# Patient Record
Sex: Female | Born: 1995 | Race: Black or African American | Hispanic: No | Marital: Married | Smoking: Never smoker
Health system: Southern US, Community
[De-identification: ages and names within clinical notes are randomized; demographics above are authoritative.]

## PROBLEM LIST (undated history)

## (undated) ENCOUNTER — Inpatient Hospital Stay (HOSPITAL_COMMUNITY): Payer: Self-pay

## (undated) DIAGNOSIS — G40909 Epilepsy, unspecified, not intractable, without status epilepticus: Secondary | ICD-10-CM

## (undated) DIAGNOSIS — M5430 Sciatica, unspecified side: Secondary | ICD-10-CM

## (undated) DIAGNOSIS — D649 Anemia, unspecified: Secondary | ICD-10-CM

## (undated) HISTORY — DX: Epilepsy, unspecified, not intractable, without status epilepticus: G40.909

---

## 2015-11-12 ENCOUNTER — Inpatient Hospital Stay (HOSPITAL_COMMUNITY)
Admission: AD | Admit: 2015-11-12 | Discharge: 2015-11-12 | Disposition: A | Discharge status: Home / Self Care | Payer: Self-pay | Source: Ambulatory Visit | Attending: Obstetrics & Gynecology | Admitting: Obstetrics & Gynecology

## 2015-11-12 DIAGNOSIS — N611 Abscess of the breast and nipple: Secondary | ICD-10-CM | POA: Insufficient documentation

## 2015-11-12 DIAGNOSIS — N61 Mastitis without abscess: Secondary | ICD-10-CM | POA: Insufficient documentation

## 2015-11-12 DIAGNOSIS — Z3202 Encounter for pregnancy test, result negative: Secondary | ICD-10-CM | POA: Insufficient documentation

## 2015-11-12 LAB — URINE MICROSCOPIC-ADD ON

## 2015-11-12 LAB — URINALYSIS, ROUTINE W REFLEX MICROSCOPIC
BILIRUBIN URINE: NEGATIVE
GLUCOSE, UA: NEGATIVE mg/dL
Ketones, ur: NEGATIVE mg/dL
LEUKOCYTES UA: NEGATIVE
Nitrite: NEGATIVE
Protein, ur: NEGATIVE mg/dL
UROBILINOGEN UA: 0.2 mg/dL (ref 0.0–1.0)
pH: 5.5 (ref 5.0–8.0)

## 2015-11-12 LAB — POCT PREGNANCY, URINE: PREG TEST UR: NEGATIVE

## 2015-11-12 MED ORDER — ACETAMINOPHEN-CODEINE #3 300-30 MG PO TABS: 1.0000 | ORAL_TABLET | ORAL | Status: DC | PRN

## 2015-11-12 MED ORDER — AMOXICILLIN-POT CLAVULANATE 875-125 MG PO TABS: 1.0000 | ORAL_TABLET | Freq: Two times a day (BID) | ORAL | Status: AC

## 2015-11-12 MED ORDER — IBUPROFEN 800 MG PO TABS: 800.0000 mg | ORAL_TABLET | Freq: Three times a day (TID) | ORAL | Status: DC

## 2015-11-12 NOTE — MAU Note (Signed)
Pt presents to MAU with complaints of pain in her right breast. States it is sore and very painful. Pt states she has never had intercourse and isn't pregnant.

## 2015-11-12 NOTE — MAU Provider Note (Signed)
History   19 yo black female in with c/o breast pain that started yesterday. Also low grade fever. Pt states she has never been sexually active.  CSN: [id number]  Arrival date & time 11/12/15  1152   None     Chief Complaint  Patient presents with  . Breast Pain    HPI  No past medical history on file.  No past surgical history on file.  No family history on file.  Social History  Substance Use Topics  . Smoking status: Not on file  . Smokeless tobacco: Not on file  . Alcohol Use: Not on file    OB History    No data available      Review of Systems  Constitutional: Negative.   HENT: Negative.   Eyes: Negative.   Respiratory: Negative.   Cardiovascular: Negative.   Endocrine: Negative.   Genitourinary: Negative.   Musculoskeletal: Negative.   Skin:       1.5 cm area at 1000 outer aspect right breast, red, firm, warm to touch  Allergic/Immunologic: Negative.   Neurological: Negative.   Hematological: Negative.   Psychiatric/Behavioral: Negative.     Allergies  Review of patient's allergies indicates not on file.  Home Medications  No current outpatient prescriptions on file.  BP 136/90 mmHg  Pulse 92  Temp(Src) 99.5 F (37.5 C) (Oral)  Resp 20  LMP 11/10/2015  Physical Exam  Constitutional: She is oriented to person, place, and time. She appears well-developed and well-nourished.  HENT:  Head: Normocephalic.  Eyes: Pupils are equal, round, and reactive to light.  Neck: Normal range of motion.  Cardiovascular: Normal rate, regular rhythm, normal heart sounds and intact distal pulses.   Pulmonary/Chest: Effort normal and breath sounds normal.  Abdominal: Soft. Bowel sounds are normal.  Musculoskeletal: Normal range of motion.  Neurological: She is alert and oriented to person, place, and time. She has normal reflexes.  Skin:  1.5 cm area at 1000 outer aspect right breast, red, firm, warm to touch   Psychiatric: She has a normal mood and  affect. Her behavior is normal. Judgment and thought content normal.    MAU Course  Procedures (including critical care time)  Labs Reviewed  URINALYSIS, ROUTINE W REFLEX MICROSCOPIC (NOT AT [facility])  URINE MICROSCOPIC-ADD ON  POCT PREGNANCY, URINE   No results found.   No diagnosis found.    MDM  1.5 cm breast abcess or cellulitis at 1000 outer aspect right breast. Start oral antibiotics, pain management and refer to breast center.

## 2015-11-12 NOTE — Discharge Instructions (Signed)
Abscess °An abscess (boil or furuncle) is an infected area on or under the skin. This area is filled with yellowish-white fluid (pus) and other material (debris). °HOME CARE  °· Only take medicines as told by your doctor. °· If you were given antibiotic medicine, take it as directed. Finish the medicine even if you start to feel better. °· If gauze is used, follow your doctor's directions for changing the gauze. °· To avoid spreading the infection: °¨ Keep your abscess covered with a bandage. °¨ Wash your hands well. °¨ Do not share personal care items, towels, or whirlpools with others. °¨ Avoid skin contact with others. °· Keep your skin and clothes clean around the abscess. °· Keep all doctor visits as told. °GET HELP RIGHT AWAY IF:  °· You have more pain, puffiness (swelling), or redness in the wound site. °· You have more fluid or blood coming from the wound site. °· You have muscle aches, chills, or you feel sick. °· You have a fever. °MAKE SURE YOU:  °· Understand these instructions. °· Will watch your condition. °· Will get help right away if you are not doing well or get worse. °  °This information is not intended to replace advice given to you by your health care provider. Make sure you discuss any questions you have with your health care provider. °  °Document Released: 06/02/2008 Document Revised: 06/15/2012 Document Reviewed: 02/28/2012 °Elsevier Interactive Patient Education ©2016 [employer] ° °

## 2017-04-07 ENCOUNTER — Other Ambulatory Visit: Payer: Self-pay | Admitting: Infectious Disease

## 2017-04-07 ENCOUNTER — Ambulatory Visit
Admission: RE | Admit: 2017-04-07 | Discharge: 2017-04-07 | Disposition: A | Discharge status: Home / Self Care | Payer: No Typology Code available for payment source | Source: Ambulatory Visit | Attending: Infectious Disease | Admitting: Infectious Disease

## 2017-04-07 DIAGNOSIS — R7611 Nonspecific reaction to tuberculin skin test without active tuberculosis: Secondary | ICD-10-CM

## 2019-11-29 ENCOUNTER — Other Ambulatory Visit: Payer: Self-pay

## 2019-11-29 DIAGNOSIS — Z20822 Contact with and (suspected) exposure to covid-19: Secondary | ICD-10-CM

## 2019-11-30 LAB — NOVEL CORONAVIRUS, NAA: SARS-CoV-2, NAA: NOT DETECTED

## 2020-02-08 ENCOUNTER — Encounter (HOSPITAL_COMMUNITY): Payer: Self-pay | Admitting: Emergency Medicine

## 2020-02-08 ENCOUNTER — Ambulatory Visit (HOSPITAL_COMMUNITY)
Admission: EM | Admit: 2020-02-08 | Discharge: 2020-02-08 | Disposition: A | Discharge status: Home / Self Care | Payer: Commercial Managed Care - PPO | Attending: Family Medicine | Admitting: Family Medicine

## 2020-02-08 ENCOUNTER — Other Ambulatory Visit: Payer: Self-pay

## 2020-02-08 DIAGNOSIS — M5432 Sciatica, left side: Secondary | ICD-10-CM | POA: Diagnosis not present

## 2020-02-08 MED ORDER — CYCLOBENZAPRINE HCL 10 MG PO TABS: 5.0000 mg | ORAL_TABLET | Freq: Every day | ORAL | 0 refills | Status: DC

## 2020-02-08 MED ORDER — PREDNISONE 10 MG (21) PO TBPK: ORAL_TABLET | ORAL | 0 refills | Status: DC

## 2020-02-08 NOTE — ED Provider Notes (Signed)
[facility]    CSN: [id number] Arrival date & time: 02/08/20  1219      History   Chief Complaint Chief Complaint  Patient presents with  . Hip Pain    HPI Barbara Rice is a 24 y.o. female.   Pt is a 24 year old female that presents with left lower back/buttocks pain with radiation down the left leg.  Been constant, waxing waning over the past week.  Denies any preceding injuries or heavy lifting.  Describes her pain as sharp and stabbing at times and having some muscle spasming in the left leg.  Some associated tingling at times and hot and cold sensations.  She took an Advil last night and was able to rest but woke up with the pain again this morning.  Pain seems worsened when laying flat.  No fever, chills, saddle paresthesias, loss of bowel or bladder function.  ROS per HPI      History reviewed. No pertinent past medical history.  There are no problems to display for this patient.   History reviewed. No pertinent surgical history.  OB History   No obstetric history on file.      Home Medications    Prior to Admission medications   Medication Sig Start Date End Date Taking? Authorizing Provider  ibuprofen (ADVIL,MOTRIN) 800 MG tablet Take 1 tablet (800 mg total) by mouth 3 (three) times daily. 11/12/15  Yes Montez Morita, CNM  acetaminophen-codeine (TYLENOL #3) 300-30 MG tablet Take 1 tablet by mouth every 4 (four) hours as needed for moderate pain. 11/12/15   Montez Morita, CNM  cyclobenzaprine (FLEXERIL) 10 MG tablet Take 0.5 tablets (5 mg total) by mouth at bedtime. 02/08/20   Dahlia Byes A, NP  predniSONE (STERAPRED UNI-PAK 21 TAB) 10 MG (21) TBPK tablet 6 tabs for 1 day, then 5 tabs for 1 das, then 4 tabs for 1 day, then 3 tabs for 1 day, 2 tabs for 1 day, then 1 tab for 1 day 02/08/20   Janace Aris, NP    Family History No family history on file.  Social History Social History   Tobacco Use  . Smoking status: Never Smoker  .  Smokeless tobacco: Never Used  Substance Use Topics  . Alcohol use: Not Currently  . Drug use: Never     Allergies   Patient has no known allergies.   Review of Systems Review of Systems   Physical Exam Triage Vital Signs ED Triage Vitals  Enc Vitals Group     BP 02/08/20 1240 130/89     Pulse Rate 02/08/20 1240 89     Resp 02/08/20 1240 16     Temp 02/08/20 1240 98.5 F (36.9 C)     Temp Source 02/08/20 1240 Oral     SpO2 02/08/20 1240 99 %     Weight --      Height --      Head Circumference --      Peak Flow --      Pain Score 02/08/20 1238 8     Pain Loc --      Pain Edu? --      Excl. in [place]? --    No data found.  Updated Vital Signs BP 130/89   Pulse 89   Temp 98.5 F (36.9 C) (Oral)   Resp 16   LMP 01/15/2020   SpO2 99%   Visual Acuity Right Eye Distance:   Left Eye Distance:  Bilateral Distance:    Right Eye Near:   Left Eye Near:    Bilateral Near:     Physical Exam Vitals and nursing note reviewed.  Constitutional:      General: She is not in acute distress.    Appearance: Normal appearance. She is not ill-appearing, toxic-appearing or diaphoretic.  HENT:     Head: Normocephalic.     Nose: Nose normal.  Eyes:     Conjunctiva/sclera: Conjunctivae normal.  Pulmonary:     Effort: Pulmonary effort is normal.  Musculoskeletal:     Cervical back: Normal range of motion.     Lumbar back: Spasms present. No bony tenderness. Decreased range of motion. Positive left straight leg raise test.       Back:  Skin:    General: Skin is warm and dry.     Findings: No rash.  Neurological:     Mental Status: She is alert.  Psychiatric:        Mood and Affect: Mood normal.      UC Treatments / Results  Labs (all labs ordered are listed, but only abnormal results are displayed) Labs Reviewed - No data to display  EKG   Radiology No results found.  Procedures Procedures (including critical care time)  Medications Ordered in  UC Medications - No data to display  Initial Impression / Assessment and Plan / UC Course  I have reviewed the triage vital signs and the nursing notes.  Pertinent labs & imaging results that were available during my care of the patient were reviewed by me and considered in my medical decision making (see chart for details).     Sciatica of left side.  Symptoms and exam consistent with this. No red flags.  Treating with prednisone and low-dose muscle relaxant Recommend rest, heat and gentle stretching and massage Follow up as needed for continued or worsening symptoms  Final Clinical Impressions(s) / UC Diagnoses   Final diagnoses:  Sciatica of left side     Discharge Instructions     Take the medication as prescribed Prednisone taper over the next 6 days. Take early in the day with food.  Flexeril as needed every 12 hours or at bedtime.  Stretching, heat Follow up as needed for continued or worsening symptoms     ED Prescriptions    Medication Sig Dispense Auth. Provider   predniSONE (STERAPRED UNI-PAK 21 TAB) 10 MG (21) TBPK tablet 6 tabs for 1 day, then 5 tabs for 1 das, then 4 tabs for 1 day, then 3 tabs for 1 day, 2 tabs for 1 day, then 1 tab for 1 day 21 tablet Yeimy Brabant A, NP   cyclobenzaprine (FLEXERIL) 10 MG tablet Take 0.5 tablets (5 mg total) by mouth at bedtime. 20 tablet Loura Halt A, NP     PDMP not reviewed this encounter.   Loura Halt A, NP 02/08/20 1418

## 2020-02-08 NOTE — ED Triage Notes (Signed)
PT reports left hip pain started Thursday. She took advil and it got worse. Pain goes down left leg. Sharp shooting pain down leg. No known injury.

## 2020-02-08 NOTE — Discharge Instructions (Addendum)
Take the medication as prescribed Prednisone taper over the next 6 days. Take early in the day with food.  Flexeril as needed every 12 hours or at bedtime.  Stretching, heat Follow up as needed for continued or worsening symptoms

## 2020-05-30 ENCOUNTER — Other Ambulatory Visit: Payer: Self-pay

## 2020-05-30 ENCOUNTER — Encounter (HOSPITAL_COMMUNITY): Payer: Self-pay

## 2020-05-30 ENCOUNTER — Ambulatory Visit (HOSPITAL_COMMUNITY)
Admission: EM | Admit: 2020-05-30 | Discharge: 2020-05-30 | Disposition: A | Discharge status: Home / Self Care | Payer: Commercial Managed Care - PPO | Attending: Internal Medicine | Admitting: Internal Medicine

## 2020-05-30 ENCOUNTER — Ambulatory Visit (INDEPENDENT_AMBULATORY_CARE_PROVIDER_SITE_OTHER): Payer: Commercial Managed Care - PPO

## 2020-05-30 DIAGNOSIS — S139XXA Sprain of joints and ligaments of unspecified parts of neck, initial encounter: Secondary | ICD-10-CM

## 2020-05-30 HISTORY — DX: Sciatica, unspecified side: M54.30

## 2020-05-30 MED ORDER — IBUPROFEN 600 MG PO TABS: 600.0000 mg | ORAL_TABLET | Freq: Four times a day (QID) | ORAL | 0 refills | Status: DC | PRN

## 2020-05-30 MED ORDER — KETOROLAC TROMETHAMINE 30 MG/ML IJ SOLN
INTRAMUSCULAR | Status: AC
  Filled 2020-05-30: qty 1

## 2020-05-30 MED ORDER — CYCLOBENZAPRINE HCL 5 MG PO TABS: 5.0000 mg | ORAL_TABLET | Freq: Three times a day (TID) | ORAL | 0 refills | Status: DC | PRN

## 2020-05-30 MED ORDER — KETOROLAC TROMETHAMINE 30 MG/ML IJ SOLN
30.0000 mg | Freq: Once | INTRAMUSCULAR | Status: AC
  Administered 2020-05-30: 30 mg via INTRAMUSCULAR
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
## 2020-11-17 IMAGING — DX DG CERVICAL SPINE COMPLETE 4+V
6 series · 6 of 6 positions shown · non-contrast
Comparison: None.

CLINICAL DATA: 23-year-old female with neck pain.

EXAM:
CERVICAL SPINE - COMPLETE 4+ VIEW

[c-spine lat]
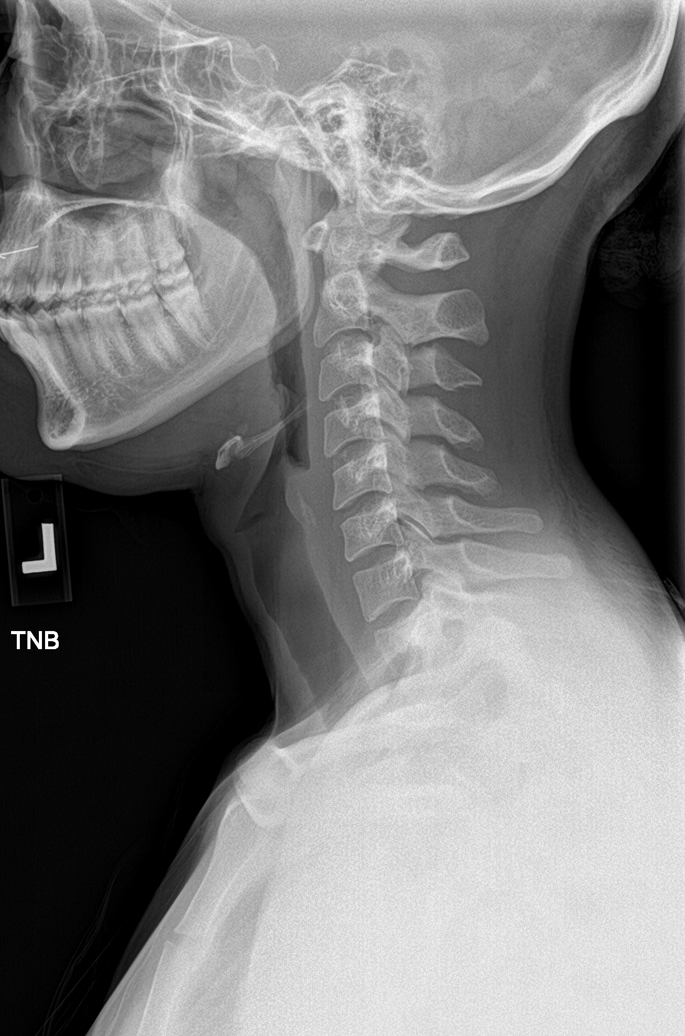

[c-spine obl (1 of 2)]
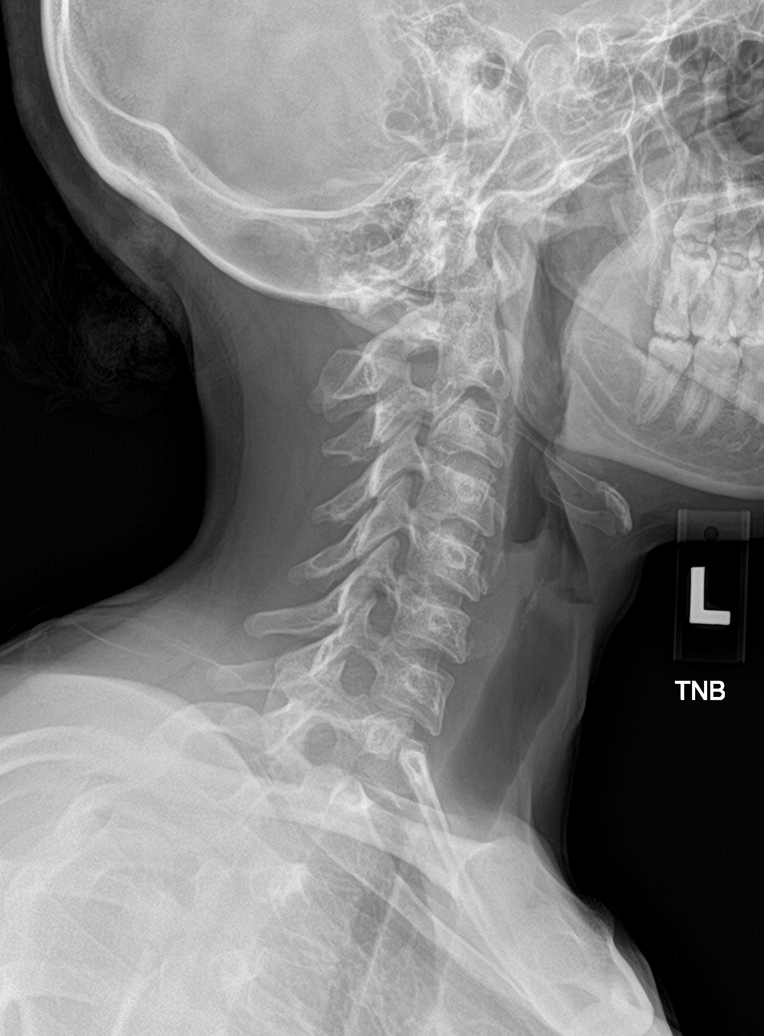

[c-spine obl (2 of 2)]
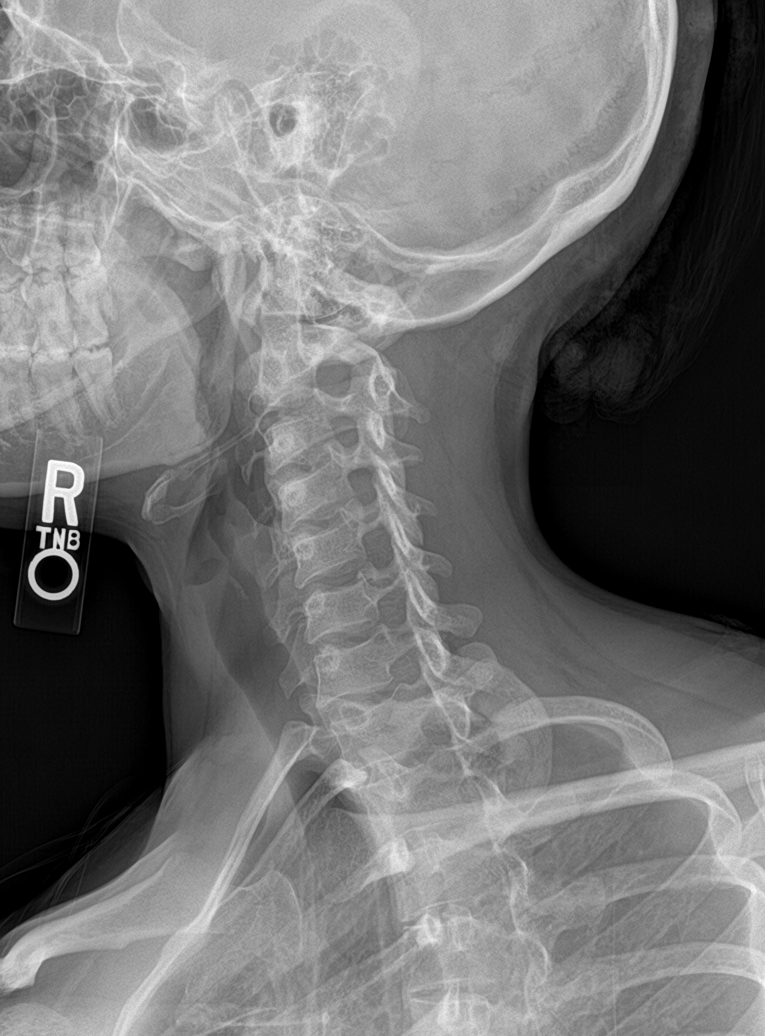

[c-spine ap]
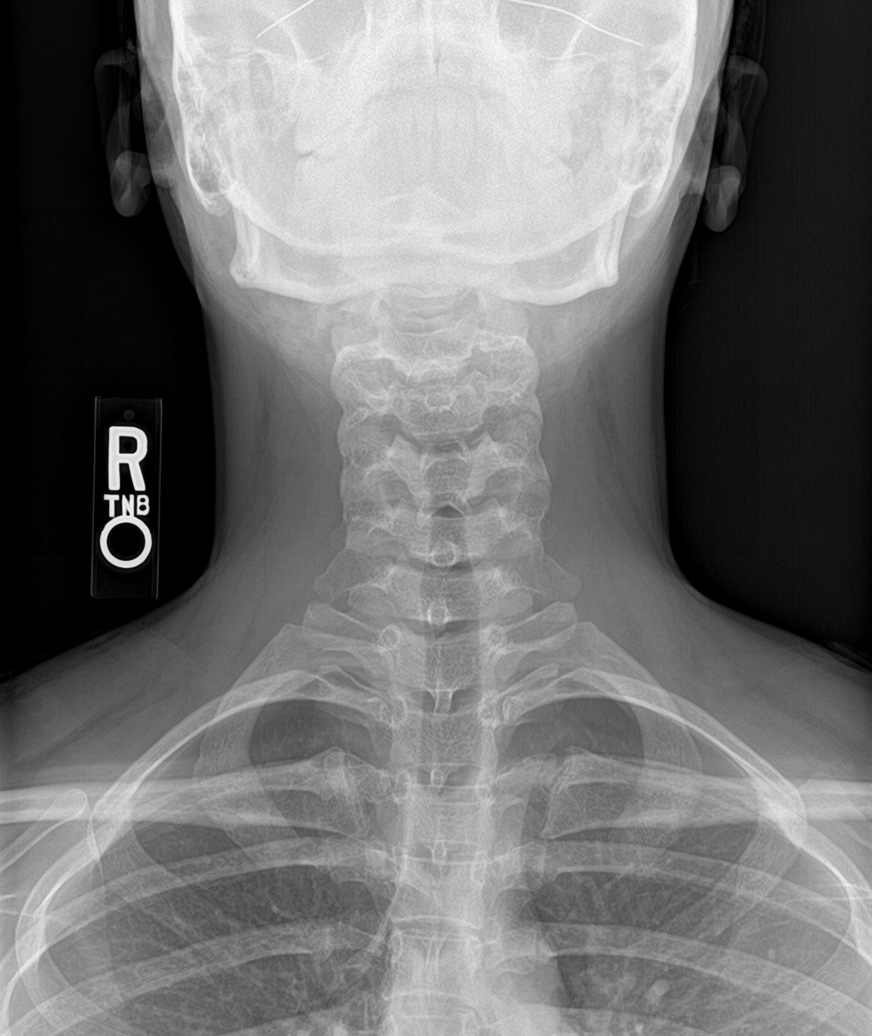

[c-spine open mouth]
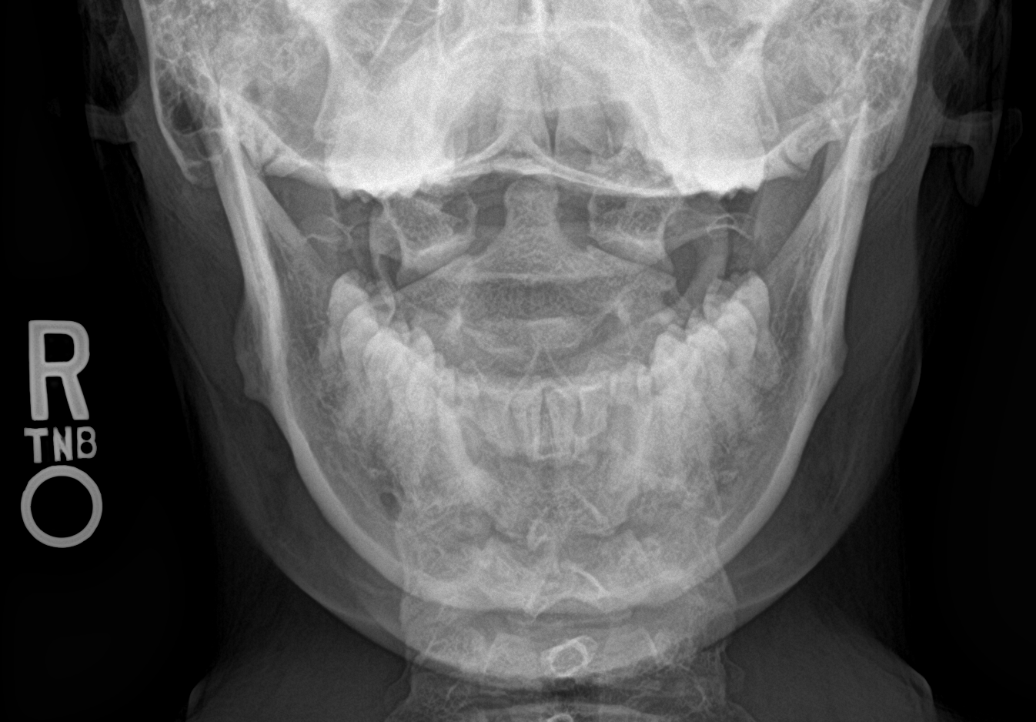

[c-spine swimmers]
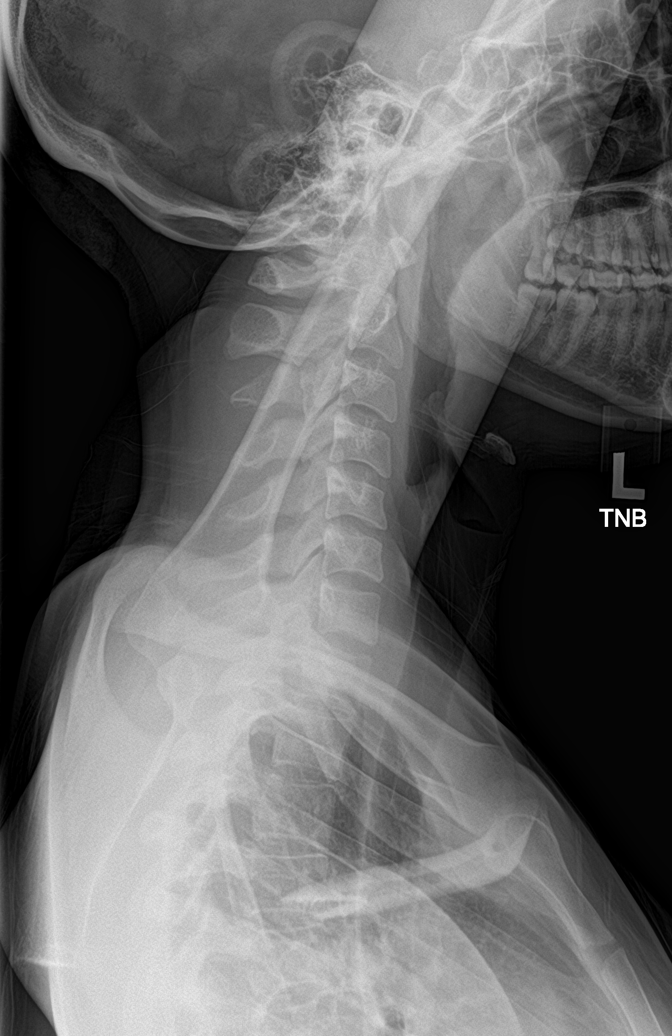

[6 of 6 positions shown; findings below may reference images not displayed]

FINDINGS: There is no evidence of cervical spine fracture or prevertebral soft
tissue swelling. Alignment is normal. No other significant bone
abnormalities are identified.
IMPRESSION: Negative cervical spine radiographs.

## 2021-11-01 ENCOUNTER — Other Ambulatory Visit: Payer: Self-pay

## 2021-11-01 ENCOUNTER — Ambulatory Visit (HOSPITAL_COMMUNITY)
Admission: EM | Admit: 2021-11-01 | Discharge: 2021-11-01 | Disposition: A | Discharge status: Home / Self Care | Payer: 59 | Attending: Physician Assistant | Admitting: Physician Assistant

## 2021-11-01 ENCOUNTER — Encounter (HOSPITAL_COMMUNITY): Payer: Self-pay

## 2021-11-01 DIAGNOSIS — M7989 Other specified soft tissue disorders: Secondary | ICD-10-CM | POA: Diagnosis not present

## 2021-11-01 DIAGNOSIS — M79671 Pain in right foot: Secondary | ICD-10-CM | POA: Diagnosis not present

## 2021-11-01 DIAGNOSIS — M79605 Pain in left leg: Secondary | ICD-10-CM

## 2021-11-01 NOTE — Discharge Instructions (Signed)
Go get ultrasound tomorrow to make sure you not have a blood clot.  Continue using TED hose/compression stockings.  Avoid salt and keep your legs elevated.  You can use over-the-counter medications for symptom relief.  Recommend he follow-up with podiatrist for your ongoing foot pain.  In the meantime use supportive insoles and footwear.  If you develop any chest pain or shortness of breath you need to go to the hospital.

## 2021-11-01 NOTE — ED Triage Notes (Signed)
Pt presents with bilateral swelling on legs. Pt states she feels tightness on her legs. States the compression socks have reduced the swelling.

## 2021-11-01 NOTE — ED Provider Notes (Signed)
[facility]    CSN: [id number] Arrival date & time: 11/01/21  1934      History   Chief Complaint Chief Complaint  Patient presents with   Leg Problem    HPI Barbara Rice is a 25 y.o. female.   Patient presents today with several concerns.  Her primary concern today is bilateral leg swelling that was significantly worse on the left side.  She reports developing a tightness and discomfort sensation in both legs worse on the left and was given compression stockings by nurse who works with her but provided improvement of symptoms.  She denies any dietary or medication changes prior to symptom onset.  Denies history of thyroid condition, chronic liver/kidney disease, heart failure.  She does report occasional episodes of shortness of breath but is unsure if this is related to anxiety or underlying issue.  She does have a history of surgery when she was much younger on her left foot but denies any ongoing swelling related to this.  She denies any history of VTE event, exogenous hormones, recent hospitalization, immobilization.  Denies any current chest pain, shortness of breath, heart racing.  In addition, patient reports a prolonged history of intermittent right medial foot pain.  She has been using supportive compression sock with minimal improvement of symptoms.  She does report symptoms are worse with prolonged ambulation such as when she is at work.  She has tried over-the-counter analgesics with temporary improvement of symptoms.  She has not seen a podiatrist in the past or had prescription insoles.   Past Medical History:  Diagnosis Date   Sciatic nerve pain     There are no problems to display for this patient.   History reviewed. No pertinent surgical history.  OB History   No obstetric history on file.      Home Medications    Prior to Admission medications   Medication Sig Start Date End Date Taking? Authorizing Provider  acetaminophen-codeine  (TYLENOL #3) 300-30 MG tablet Take 1 tablet by mouth every 4 (four) hours as needed for moderate pain. 11/12/15   Montez Morita, CNM  cyclobenzaprine (FLEXERIL) 5 MG tablet Take 1 tablet (5 mg total) by mouth 3 (three) times daily as needed for muscle spasms. 05/30/20   Lamptey, Britta Mccreedy, MD  ibuprofen (ADVIL) 600 MG tablet Take 1 tablet (600 mg total) by mouth every 6 (six) hours as needed. 05/30/20   Lamptey, Britta Mccreedy, MD  predniSONE (STERAPRED UNI-PAK 21 TAB) 10 MG (21) TBPK tablet 6 tabs for 1 day, then 5 tabs for 1 das, then 4 tabs for 1 day, then 3 tabs for 1 day, 2 tabs for 1 day, then 1 tab for 1 day 02/08/20   Janace Aris, NP    Family History History reviewed. No pertinent family history.  Social History Social History   Tobacco Use   Smoking status: Never   Smokeless tobacco: Never  Substance Use Topics   Alcohol use: Not Currently   Drug use: Never     Allergies   Patient has no known allergies.   Review of Systems Review of Systems  Constitutional:  Positive for activity change. Negative for appetite change, fatigue and fever.  Respiratory:  Positive for shortness of breath. Negative for cough.   Cardiovascular:  Positive for leg swelling. Negative for chest pain.  Gastrointestinal:  Negative for abdominal pain, diarrhea, nausea and vomiting.  Musculoskeletal:  Positive for gait problem and myalgias. Negative for arthralgias.  Neurological:  Negative for dizziness, light-headedness, numbness and headaches.    Physical Exam Triage Vital Signs ED Triage Vitals  Enc Vitals Group     BP 11/01/21 1958 (!) 147/89     Pulse Rate 11/01/21 1957 88     Resp 11/01/21 1957 18     Temp --      Temp src --      SpO2 11/01/21 1957 100 %     Weight --      Height --      Head Circumference --      Peak Flow --      Pain Score 11/01/21 1956 3     Pain Loc --      Pain Edu? --      Excl. in GC? --    No data found.  Updated Vital Signs BP (!) 147/89   Pulse 88    Resp 18   LMP 10/12/2021 (Exact Date)   SpO2 100%   Visual Acuity Right Eye Distance:   Left Eye Distance:   Bilateral Distance:    Right Eye Near:   Left Eye Near:    Bilateral Near:     Physical Exam Vitals reviewed.  Constitutional:      General: She is awake. She is not in acute distress.    Appearance: Normal appearance. She is well-developed. She is not ill-appearing.     Comments: Very pleasant female appears stated age in no acute distress sitting comfortably in exam room  HENT:     Head: Normocephalic and atraumatic.  Cardiovascular:     Rate and Rhythm: Normal rate and regular rhythm.     Heart sounds: Normal heart sounds, S1 normal and S2 normal. No murmur heard.    Comments: Nonpitting edema to knee bilaterally Pulmonary:     Effort: Pulmonary effort is normal.     Breath sounds: Normal breath sounds. No wheezing, rhonchi or rales.     Comments: Clear to auscultation bilaterally Abdominal:     General: Bowel sounds are normal.     Palpations: Abdomen is soft.     Tenderness: There is no abdominal tenderness.  Musculoskeletal:     Right lower leg: Tenderness present. No deformity or bony tenderness. No edema.     Left lower leg: Tenderness present. No deformity or bony tenderness. No edema.  Psychiatric:        Behavior: Behavior is cooperative.     UC Treatments / Results  Labs (all labs ordered are listed, but only abnormal results are displayed) Labs Reviewed - No data to display  EKG   Radiology No results found.  Procedures Procedures (including critical care time)  Medications Ordered in UC Medications - No data to display  Initial Impression / Assessment and Plan / UC Course  I have reviewed the triage vital signs and the nursing notes.  Pertinent labs & imaging results that were available during my care of the patient were reviewed by me and considered in my medical decision making (see chart for details).     Suspect dependent edema  given clinical presentation including improvement with compression stockings.  Given increased swelling on left with associated pain will order outpatient ultrasound to rule out DVT.  Discussed that if she develops any recurrent leg swelling or additional symptoms such as chest pain, shortness of breath, lightheadedness, palpitations she needs to go to the emergency room.  Discussed that if symptoms persist will need to consider work-up for edema including TSH, CMP,  UA, BNP.  Will defer additional testing at this time given short duration of symptoms have significantly improved.  She was encouraged to continue using conservative treatment including compression, elevation, avoiding sodium.  Recommended follow-up with primary care provider for ongoing management.  Should return precautions given to which she expressed understanding.  Patient denies any significant trauma and had no bony tenderness on exam to warrant plain films.  Discussed that symptoms are most likely related to need for insoles or more supportive shoes.  She was given contact information for podiatrist and encouraged to follow-up and schedule an appointment.  She can use over-the-counter analgesics for symptom relief.  Recommended she avoid prolonged ambulation and standing.  Strict return precautions given to which she expressed understanding.  Final Clinical Impressions(s) / UC Diagnoses   Final diagnoses:  Left leg pain  Right foot pain  Left leg swelling     Discharge Instructions      Go get ultrasound tomorrow to make sure you not have a blood clot.  Continue using TED hose/compression stockings.  Avoid salt and keep your legs elevated.  You can use over-the-counter medications for symptom relief.  Recommend he follow-up with podiatrist for your ongoing foot pain.  In the meantime use supportive insoles and footwear.  If you develop any chest pain or shortness of breath you need to go to the hospital.     ED Prescriptions    None    PDMP not reviewed this encounter.   Jeani Hawking, PA-C 11/01/21 2108

## 2021-11-02 ENCOUNTER — Ambulatory Visit (HOSPITAL_COMMUNITY)
Admission: RE | Admit: 2021-11-02 | Discharge: 2021-11-02 | Disposition: A | Discharge status: Home / Self Care | Payer: 59 | Source: Ambulatory Visit | Attending: Physician Assistant | Admitting: Physician Assistant

## 2021-11-02 DIAGNOSIS — R600 Localized edema: Secondary | ICD-10-CM | POA: Insufficient documentation

## 2021-11-02 DIAGNOSIS — R531 Weakness: Secondary | ICD-10-CM | POA: Diagnosis not present

## 2021-11-02 DIAGNOSIS — M7989 Other specified soft tissue disorders: Secondary | ICD-10-CM | POA: Diagnosis not present

## 2021-11-02 NOTE — Progress Notes (Signed)
VASCULAR LAB    Bilateral lower extremity venous duplex has been performed.  See CV proc for preliminary results.   Torsha Lemus, RVT 11/02/2021, 11:25 AM

## 2021-11-12 ENCOUNTER — Encounter: Payer: Self-pay | Admitting: Podiatry

## 2021-11-12 ENCOUNTER — Ambulatory Visit (INDEPENDENT_AMBULATORY_CARE_PROVIDER_SITE_OTHER): Payer: 59

## 2021-11-12 ENCOUNTER — Other Ambulatory Visit: Payer: Self-pay

## 2021-11-12 ENCOUNTER — Ambulatory Visit (INDEPENDENT_AMBULATORY_CARE_PROVIDER_SITE_OTHER): Payer: 59 | Admitting: Podiatry

## 2021-11-12 DIAGNOSIS — M2142 Flat foot [pes planus] (acquired), left foot: Secondary | ICD-10-CM

## 2021-11-12 DIAGNOSIS — M7750 Other enthesopathy of unspecified foot: Secondary | ICD-10-CM | POA: Diagnosis not present

## 2021-11-12 DIAGNOSIS — M25571 Pain in right ankle and joints of right foot: Secondary | ICD-10-CM | POA: Diagnosis not present

## 2021-11-12 DIAGNOSIS — M7752 Other enthesopathy of left foot: Secondary | ICD-10-CM | POA: Diagnosis not present

## 2021-11-12 DIAGNOSIS — M2141 Flat foot [pes planus] (acquired), right foot: Secondary | ICD-10-CM

## 2021-11-12 NOTE — Progress Notes (Signed)
  Subjective:  Patient ID: Barbara Rice, female    DOB: 11-05-1996,  MRN: [id number]  Chief Complaint  Patient presents with   Foot Pain    NP  right foot pain    25 y.o. female presents with the above complaint. History confirmed with patient.  Pain is in the bottom of the arch in the front of the ankle  Objective:  Physical Exam: warm, good capillary refill, no trophic changes or ulcerative lesions, normal DP and PT pulses, normal sensory exam, and she has right worse than left pes planus deformity with collapse of the medial arch, pain on palpation of the plantar medial arch on the right side and the sinus tarsi.   Radiographs: Multiple views x-ray of the right foot: no fracture, dislocation, swelling or degenerative changes noted and pes planus Assessment:   1. Pes planus of both feet   2. Sinus tarsitis, right      Plan:  Patient was evaluated and treated and all questions answered.  She has significant pes planus for me which I reviewed with her.  I discussed with her that this on its own does not necessarily an issue unless it becomes symptomatic.  I recommended supporting with a longitudinal foot orthosis and a prefabricated 1 was dispensed to her today that she purchased.  Also discussed shoe gear and stretching.  She will return to see me as needed for this.   Return if symptoms worsen or fail to improve.

## 2021-11-12 NOTE — Patient Instructions (Signed)
Posterior Tibial Tendinitis  Posterior tibial tendinitis is irritation of a tendon called the posterior tibial tendon. Your posterior tibial tendon is a cord-like tissue that connects bones of your lower leg and foot to a muscle that: Supports your arch. Helps you raise up on your toes. Helps you turn your foot down and in. This condition causes foot and ankle pain. It can also lead to a flat foot. What are the causes? This condition is most often caused by repeated stress to the tendon (overuse injury). It can also be caused by a sudden injury that stresses the tendon, such as landing on your foot after jumping or falling. What increases the risk? This condition is more likely to develop in: People who play a sport that involves putting a lot of pressure on the feet, such as: Basketball. Tennis. Soccer. Hockey. Runners. Females who are older than 27 years of age and are overweight. People with diabetes. People with decreased foot stability. People with flat feet. What are the signs or symptoms? Symptoms include: Pain in the inner ankle. Pain at the arch of your foot. Pain that gets worse with running, walking, or standing. Swelling on the inside of your ankle and foot. Weakness in your ankle or foot. Inability to stand up on tiptoe. Flattening of the arch of your foot. How is this diagnosed? This condition may be diagnosed based on: Your symptoms. Your medical history. A physical exam. Tests, such as: X-ray. MRI. Ultrasound. How is this treated? This condition may be treated by: Putting ice to the injured area. Taking NSAIDs, such as ibuprofen, to reduce pain and swelling. Wearing a special shoe or shoe insert to support your arch (orthotic). Having physical therapy. Replacing high-impact exercise with low-impact exercise, such as swimming or cycling. If your symptoms do not improve with these treatments, you may need to wear a splint, removable walking boot, or short  leg cast for 6-8 weeks to keep your foot and ankle still (immobilized). Follow these instructions at home: If you have a cast, splint, or boot: Keep it clean and dry. Check the skin around it every day. Tell your health care provider about any concerns. If you have a cast: Do not stick anything inside it to scratch your skin. Doing that increases your risk of infection. You may put lotion on dry skin around the edges of the cast. Do not put lotion on the skin underneath the cast. If you have a splint or boot: Wear it as told by your health care provider. Remove it only as told by your health care provider. Loosen it if your toes tingle, become numb, or turn cold and blue. Bathing Do not take baths, swim, or use a hot tub until your health care provider approves. Ask your health care provider if you may take showers. If your cast, splint, or boot is not waterproof: Do not let it get wet. Cover it with a waterproof covering while you take a bath or a shower. Managing pain and swelling   If directed, put ice on the injured area. If you have a removable splint or boot, remove it as told by your health care provider. Put ice in a plastic bag. Place a towel between your skin and the bag or between your cast and the bag. Leave the ice on for 20 minutes, 2-3 times a day. Move your toes often to reduce stiffness and swelling. Raise (elevate) the injured area above the level of your heart while you are sitting   or lying down. Activity Do not use the injured foot to support your body weight until your health care provider says that you can. Use crutches as told by your health care provider. Do not do activities that make pain or swelling worse. Ask your health care provider when it is safe to drive if you have a cast, splint, or boot on your foot. Return to your normal activities as told by your health care provider. Ask your health care provider what activities are safe for you. Do exercises as  told by your health care provider. General instructions Take over-the-counter and prescription medicines only as told by your health care provider. If you have an orthotic, use it as told by your health care provider. Keep all follow-up visits as told by your health care provider. This is important. How is this prevented? Wear footwear that is appropriate to your athletic activity. Avoid athletic activities that cause pain or swelling in your ankle or foot. Before being active, do range-of-motion and stretching exercises. If you develop pain or swelling while training, stop training. If you have pain or swelling that does not improve after a few days of rest, see your health care provider. If you start a new athletic activity, start gradually so you can build up your strength and flexibility. Contact a health care provider if: Your symptoms get worse. Your symptoms do not improve in 6-8 weeks. You develop new, unexplained symptoms. Your splint, boot, or cast gets damaged. Summary Posterior tibial tendinitis is irritation of a tendon called the posterior tibial tendon. This condition is most often caused by repeated stress to the tendon (overuse injury). This condition causes foot pain and ankle pain. It can also lead to a flat foot. This condition may be treated by not doing high-impact activities, applying ice, having physical therapy, wearing orthotics, and wearing a cast, splint, or boot if needed. This information is not intended to replace advice given to you by your health care provider. Make sure you discuss any questions you have with your health care provider. Document Revised: 04/12/2019 Document Reviewed: 02/17/2019 [employer] Patient Education  2020 [employer]  Posterior Tibial Tendinitis Rehab Ask your health care provider which exercises are safe for you. Do exercises exactly as told by your health care provider and adjust them as directed. It is normal to feel mild  stretching, pulling, tightness, or discomfort as you do these exercises. Stop right away if you feel sudden pain or your pain gets worse. Do not begin these exercises until told by your health care provider. Stretching and range-of-motion exercises These exercises warm up your muscles and joints and improve the movement and flexibility in your ankle and foot. These exercises may also help to relieve pain. Standing wall calf stretch, knee straight   Stand with your hands against a wall. Extend your left / right leg behind you, and bend your front knee slightly. If directed, place a folded washcloth under the arch of your foot for support. Point the toes of your back foot slightly inward. Keeping your heels on the floor and your back knee straight, shift your weight toward the wall. Do not allow your back to arch. You should feel a gentle stretch in your upper left / right calf. Hold this position for 10 seconds. Repeat 10 times. Complete this exercise 2 times a day. Standing wall calf stretch, knee bent Stand with your hands against a wall. Extend your left / right leg behind you, and bend your front   knee slightly. If directed, place a folded washcloth under the arch of your foot for support. Point the toes of your back foot slightly inward. Unlock your back knee so it is bent. Keep your heels on the floor. You should feel a gentle stretch deep in your lower left / right calf. Hold this position for 10 seconds. Repeat 10 times. Complete this exercise 2 times a day. Strengthening exercises These exercises build strength and endurance in your ankle and foot. Endurance is the ability to use your muscles for a long time, even after they get tired. Ankle inversion with band Secure one end of a rubber exercise band or tubing to a fixed object, such as a table leg or a pole, that will stay still when the band is pulled. Loop the other end of the band around the middle of your left / right foot. Sit  on the floor facing the object with your left / right leg extended. The band or tube should be slightly tense when your foot is relaxed. Leading with your big toe, slowly bring your left / right foot and ankle inward, toward your other foot (inversion). Hold this position for 10 seconds. Slowly return your foot to the starting position. Repeat 10 times. Complete this exercise 2 times a day. Towel curls   Sit in a chair on a non-carpeted surface, and put your feet on the floor. Place a towel in front of your feet. Keeping your heel on the floor, put your left / right foot on the towel. Pull the towel toward you by grabbing the towel with your toes and curling them under. Keep your heel on the floor while you do this. Let your toes relax. Grab the towel with your toes again. Keep going until the towel is completely underneath your foot. Repeat 10 times. Complete this exercise 2 times a day. Balance exercise This exercise improves or maintains your balance. Balance is important in preventing falls. Single leg stand Without wearing shoes, stand near a railing or in a doorway. You may hold on to the railing or door frame as needed for balance. Stand on your left / right foot. Keep your big toe down on the floor and try to keep your arch lifted. If balancing in this position is too easy, try the exercise with your eyes closed or while standing on a pillow. Hold this position for 10 seconds. Repeat 10 times. Complete this exercise 2 times a day. This information is not intended to replace advice given to you by your health care provider. Make sure you discuss any questions you have with your health care provider.  

## 2021-11-29 ENCOUNTER — Other Ambulatory Visit: Payer: Self-pay

## 2021-11-29 ENCOUNTER — Ambulatory Visit (INDEPENDENT_AMBULATORY_CARE_PROVIDER_SITE_OTHER): Payer: 59 | Admitting: Nurse Practitioner

## 2021-11-29 ENCOUNTER — Encounter: Payer: Self-pay | Admitting: Nurse Practitioner

## 2021-11-29 VITALS — BP 135/87 | HR 117 | Temp 98.4°F | Ht 65.0 in | Wt 167.4 lb

## 2021-11-29 DIAGNOSIS — Z0000 Encounter for general adult medical examination without abnormal findings: Secondary | ICD-10-CM

## 2021-11-29 DIAGNOSIS — R0602 Shortness of breath: Secondary | ICD-10-CM | POA: Diagnosis not present

## 2021-11-29 DIAGNOSIS — M7989 Other specified soft tissue disorders: Secondary | ICD-10-CM

## 2021-11-29 MED ORDER — FUROSEMIDE 40 MG PO TABS: 40.0000 mg | ORAL_TABLET | Freq: Every day | ORAL | 3 refills | Status: DC

## 2021-11-29 NOTE — Progress Notes (Signed)
[place], [place]  [address] Phone:  [phone]   Fax:  [phone] Subjective:   Patient ID: Barbara Rice, female    DOB: 1996-11-25, 25 y.o.   MRN: [id number]  Chief Complaint  Patient presents with   Establish Care    Pt stated she had LT leg swelling pt went to urgent care didn't get any med's. Pt stated she did an ultrasound and it came back good just large lymph nodes.   HPI Barbara Rice 25 y.o. female with no significant medical history to the [facility] to establish care and for bilateral leg swelling and pain.   States that she has had swelling and pain in the BLE x 1 mth. Pain increases with weight bearing, prolonged standing and when laying in bed. Takes Advil for pain with only temporary relief in symptoms. Endorses periodic shortness of breath with exertion, but denies any chest pain.Currently rates pain 6/10 and describes as aching and throbbing. Denies any trauma or injury. Denies any other complaints today. States that she was evaluated in UC and by podiatry with no clear treatment plan or improvement in symptoms.Has been using TED hose regularly.  Denies any acute weight gain. Endorses completion of regular self breast exams. Denies any fatigue, chest pain, shortness of breath, HA or dizziness. Denies any blurred vision, numbness or tingling. Currently not taking any regular medications.   Past Medical History:  Diagnosis Date   Sciatic nerve pain     History reviewed. No pertinent surgical history.  Family History  Problem Relation Age of Onset   Cancer Father    Hypertension Father    Cancer Paternal Aunt    Cancer Paternal Uncle     Social History   Socioeconomic History   Marital status: Married    Spouse name: Not on file   Number of children: Not on file   Years of education: Not on file   Highest education level: Not on file  Occupational History   Not on file  Tobacco Use   Smoking status:  Never   Smokeless tobacco: Never  Vaping Use   Vaping Use: Never used  Substance and Sexual Activity   Alcohol use: Never   Drug use: Never   Sexual activity: Not Currently  Other Topics Concern   Not on file  Social History Narrative   Not on file   Social Determinants of Health   Financial Resource Strain: Not on file  Food Insecurity: Not on file  Transportation Needs: Not on file  Physical Activity: Not on file  Stress: Not on file  Social Connections: Not on file  Intimate Partner Violence: Not on file    Outpatient Medications Prior to Visit  Medication Sig Dispense Refill   acetaminophen-codeine (TYLENOL #3) 300-30 MG tablet Take 1 tablet by mouth every 4 (four) hours as needed for moderate pain. 30 tablet 0   cyclobenzaprine (FLEXERIL) 5 MG tablet Take 1 tablet (5 mg total) by mouth 3 (three) times daily as needed for muscle spasms. 20 tablet 0   ibuprofen (ADVIL) 600 MG tablet Take 1 tablet (600 mg total) by mouth every 6 (six) hours as needed. 30 tablet 0   predniSONE (STERAPRED UNI-PAK 21 TAB) 10 MG (21) TBPK tablet 6 tabs for 1 day, then 5 tabs for 1 das, then 4 tabs for 1 day, then 3 tabs for 1 day, 2 tabs for 1 day, then 1 tab for 1 day 21 tablet  0   SODIUM FLUORIDE, DENTAL RINSE, 0.2 % SOLN Take by mouth as directed.     No facility-administered medications prior to visit.    No Known Allergies  Review of Systems  Constitutional:  Negative for chills, fever and malaise/fatigue.  HENT: Negative.    Eyes: Negative.   Respiratory:  Negative for cough and shortness of breath.   Cardiovascular:  Positive for leg swelling. Negative for chest pain, palpitations and claudication.  Gastrointestinal:  Negative for abdominal pain, blood in stool, constipation, diarrhea, nausea and vomiting.  Genitourinary: Negative.   Musculoskeletal: Negative.   Skin: Negative.   Neurological: Negative.   Psychiatric/Behavioral:  Negative for depression. The patient is not  nervous/anxious.   All other systems reviewed and are negative.     Objective:    Physical Exam Vitals reviewed.  Constitutional:      General: She is not in acute distress.    Appearance: Normal appearance. She is normal weight.  HENT:     Head: Normocephalic.     Right Ear: Tympanic membrane, ear canal and external ear normal.     Left Ear: Tympanic membrane, ear canal and external ear normal.     Nose: Nose normal.     Mouth/Throat:     Mouth: Mucous membranes are moist.     Pharynx: Oropharynx is clear.  Eyes:     Extraocular Movements: Extraocular movements intact.     Conjunctiva/sclera: Conjunctivae normal.     Pupils: Pupils are equal, round, and reactive to light.  Cardiovascular:     Rate and Rhythm: Normal rate and regular rhythm.     Pulses: Normal pulses.     Heart sounds: Normal heart sounds.     Comments: Mild to moderate swelling in the BLE, non pitting Pulmonary:     Effort: Pulmonary effort is normal.     Breath sounds: Normal breath sounds.  Abdominal:     General: Abdomen is flat. Bowel sounds are normal. There is no distension.     Palpations: Abdomen is soft. There is no mass.     Tenderness: There is no abdominal tenderness. There is no right CVA tenderness, left CVA tenderness, guarding or rebound.     Hernia: No hernia is present.  Musculoskeletal:        General: Swelling present. No tenderness, deformity or signs of injury. Normal range of motion.     Cervical back: Normal range of motion and neck supple.     Right lower leg: Edema present.     Left lower leg: Edema present.     Comments: Swelling radiates from bilateral toes to knee  Skin:    General: Skin is warm and dry.     Capillary Refill: Capillary refill takes less than 2 seconds.  Neurological:     General: No focal deficit present.     Mental Status: She is alert and oriented to person, place, and time.  Psychiatric:        Mood and Affect: Mood normal.        Behavior: Behavior  normal.        Thought Content: Thought content normal.        Judgment: Judgment normal.    BP 135/87   Pulse (!) 117   Temp 98.4 F (36.9 C)   Ht 5' 5"  (1.651 m)   Wt 167 lb 6 oz (75.9 kg)   LMP 11/07/2021 (Approximate)   SpO2 100%   BMI 27.85 kg/m  Wt Readings  from Last 3 Encounters:  11/29/21 167 lb 6 oz (75.9 kg)  05/30/20 155 lb (70.3 kg)    Immunization History  Administered Date(s) Administered   Influenza-Unspecified 10/09/2021    Diabetic Foot Exam - Simple   No data filed     No results found for: TSH No results found for: WBC, HGB, HCT, MCV, PLT No results found for: NA, K, CHLORIDE, CO2, GLUCOSE, BUN, CREATININE, BILITOT, ALKPHOS, AST, ALT, PROT, ALBUMIN, CALCIUM, ANIONGAP, EGFR, GFR No results found for: CHOL No results found for: HDL No results found for: LDLCALC No results found for: TRIG No results found for: CHOLHDL No results found for: HGBA1C     Assessment & Plan:   Problem List Items Addressed This Visit   None Visit Diagnoses     Healthcare maintenance    -  Primary   Relevant Orders   HIV antibody (with reflex)   CBC with Differential/Platelet   Comprehensive metabolic panel   Lipid panel   Hemoglobin A1c   Leg swelling       Relevant Medications   furosemide (LASIX) 40 MG tablet   Other Relevant Orders   Pro b natriuretic peptide   TSH+T4F+T3Free Concerned for idiopathic peripheral edema v CHF Patient had [place] completed with groin lymphadenopathy, but otherwise benign and reassuring results Encouraged continued usage of TED hose and elevation of effected extremities   Shortness of breath               Will trial Lasix           Benign PE, imaging deferred   Follow up in 1 mth for pap smear and reevaluation of leg swelling, sooner as needed    I am having Barbara Rice maintain her acetaminophen-codeine, predniSONE, ibuprofen, cyclobenzaprine, SODIUM FLUORIDE (DENTAL RINSE), and furosemide.  Meds ordered this encounter   Medications   DISCONTD: furosemide (LASIX) 40 MG tablet    Sig: Take 1 tablet (40 mg total) by mouth daily.    Dispense:  30 tablet    Refill:  3   furosemide (LASIX) 40 MG tablet    Sig: Take 1 tablet (40 mg total) by mouth daily.    Dispense:  30 tablet    Refill:  3     Teena Dunk, NP

## 2021-11-29 NOTE — Patient Instructions (Signed)
You were seen today in the [facility] for bilateral leg swelling and to establish care. Labs were collected, results will be available via MyChart or, if abnormal, you will be contacted by clinic staff. You were prescribed medications, please take as directed. Please follow up in for reevaluation and pap smear.

## 2021-11-30 LAB — CBC WITH DIFFERENTIAL/PLATELET
Basophils Absolute: 0 10*3/uL (ref 0.0–0.2)
Basos: 1 %
EOS (ABSOLUTE): 0.2 10*3/uL (ref 0.0–0.4)
Eos: 3 %
Hematocrit: 39.7 % (ref 34.0–46.6)
Hemoglobin: 13.3 g/dL (ref 11.1–15.9)
Immature Grans (Abs): 0 10*3/uL (ref 0.0–0.1)
Immature Granulocytes: 0 %
Lymphocytes Absolute: 1.9 10*3/uL (ref 0.7–3.1)
Lymphs: 42 %
MCH: 29.2 pg (ref 26.6–33.0)
MCHC: 33.5 g/dL (ref 31.5–35.7)
MCV: 87 fL (ref 79–97)
Monocytes Absolute: 0.3 10*3/uL (ref 0.1–0.9)
Monocytes: 6 %
Neutrophils Absolute: 2.2 10*3/uL (ref 1.4–7.0)
Neutrophils: 48 %
Platelets: 127 10*3/uL — ABNORMAL LOW (ref 150–450)
RBC: 4.55 x10E6/uL (ref 3.77–5.28)
RDW: 12.4 % (ref 11.7–15.4)
WBC: 4.6 10*3/uL (ref 3.4–10.8)

## 2021-11-30 LAB — COMPREHENSIVE METABOLIC PANEL
ALT: 11 IU/L (ref 0–32)
AST: 18 IU/L (ref 0–40)
Albumin/Globulin Ratio: 2.1 (ref 1.2–2.2)
Albumin: 5.1 g/dL — ABNORMAL HIGH (ref 3.9–5.0)
Alkaline Phosphatase: 50 IU/L (ref 44–121)
BUN/Creatinine Ratio: 9 (ref 9–23)
BUN: 8 mg/dL (ref 6–20)
Bilirubin Total: 0.4 mg/dL (ref 0.0–1.2)
CO2: 23 mmol/L (ref 20–29)
Calcium: 10.2 mg/dL (ref 8.7–10.2)
Chloride: 101 mmol/L (ref 96–106)
Creatinine, Ser: 0.88 mg/dL (ref 0.57–1.00)
Globulin, Total: 2.4 g/dL (ref 1.5–4.5)
Glucose: 84 mg/dL (ref 70–99)
Potassium: 4.1 mmol/L (ref 3.5–5.2)
Sodium: 138 mmol/L (ref 134–144)
Total Protein: 7.5 g/dL (ref 6.0–8.5)
eGFR: 92 mL/min/{1.73_m2} (ref >59)

## 2021-11-30 LAB — TSH+T4F+T3FREE
Free T4: 1.19 ng/dL (ref 0.82–1.77)
T3, Free: 2.9 pg/mL (ref 2.0–4.4)
TSH: 0.466 u[IU]/mL (ref 0.450–4.500)

## 2021-11-30 LAB — LIPID PANEL
Chol/HDL Ratio: 2.9 ratio (ref 0.0–4.4)
Cholesterol, Total: 227 mg/dL — ABNORMAL HIGH (ref 100–199)
HDL: 78 mg/dL (ref >39)
LDL Chol Calc (NIH): 133 mg/dL — ABNORMAL HIGH (ref 0–99)
Triglycerides: 92 mg/dL (ref 0–149)
VLDL Cholesterol Cal: 16 mg/dL (ref 5–40)

## 2021-11-30 LAB — HEMOGLOBIN A1C
Est. average glucose Bld gHb Est-mCnc: 114 mg/dL
Hgb A1c MFr Bld: 5.6 % (ref 4.8–5.6)

## 2021-11-30 LAB — HIV ANTIBODY (ROUTINE TESTING W REFLEX): HIV Screen 4th Generation wRfx: NONREACTIVE

## 2021-11-30 LAB — PRO B NATRIURETIC PEPTIDE: NT-Pro BNP: 5 pg/mL (ref 0–130)

## 2021-12-01 LAB — CBC WITH DIFFERENTIAL/PLATELET

## 2021-12-01 LAB — HEMOGLOBIN A1C

## 2021-12-03 LAB — COMPREHENSIVE METABOLIC PANEL
ALT: 11 IU/L (ref 0–32)
AST: 19 IU/L (ref 0–40)
Albumin/Globulin Ratio: 1.9 (ref 1.2–2.2)
Albumin: 5.3 g/dL — ABNORMAL HIGH (ref 3.9–5.0)
Alkaline Phosphatase: 53 IU/L (ref 44–121)
BUN/Creatinine Ratio: 8 — ABNORMAL LOW (ref 9–23)
BUN: 7 mg/dL (ref 6–20)
Bilirubin Total: 0.4 mg/dL (ref 0.0–1.2)
CO2: 22 mmol/L (ref 20–29)
Calcium: 10.5 mg/dL — ABNORMAL HIGH (ref 8.7–10.2)
Chloride: 100 mmol/L (ref 96–106)
Creatinine, Ser: 0.92 mg/dL (ref 0.57–1.00)
Globulin, Total: 2.8 g/dL (ref 1.5–4.5)
Glucose: 86 mg/dL (ref 70–99)
Potassium: 3.9 mmol/L (ref 3.5–5.2)
Sodium: 136 mmol/L (ref 134–144)
Total Protein: 8.1 g/dL (ref 6.0–8.5)
eGFR: 88 mL/min/{1.73_m2} (ref >59)

## 2021-12-03 LAB — LIPID PANEL
Chol/HDL Ratio: 2.9 ratio (ref 0.0–4.4)
Cholesterol, Total: 231 mg/dL — ABNORMAL HIGH (ref 100–199)
HDL: 81 mg/dL (ref >39)
LDL Chol Calc (NIH): 133 mg/dL — ABNORMAL HIGH (ref 0–99)
Triglycerides: 96 mg/dL (ref 0–149)
VLDL Cholesterol Cal: 17 mg/dL (ref 5–40)

## 2021-12-03 LAB — PRO B NATRIURETIC PEPTIDE: NT-Pro BNP: 12 pg/mL (ref 0–130)

## 2021-12-03 LAB — HIV ANTIBODY (ROUTINE TESTING W REFLEX): HIV Screen 4th Generation wRfx: NONREACTIVE

## 2021-12-10 ENCOUNTER — Other Ambulatory Visit: Payer: Self-pay

## 2021-12-10 ENCOUNTER — Encounter: Payer: Self-pay | Admitting: Nurse Practitioner

## 2021-12-10 ENCOUNTER — Ambulatory Visit (INDEPENDENT_AMBULATORY_CARE_PROVIDER_SITE_OTHER): Payer: 59 | Admitting: Nurse Practitioner

## 2021-12-10 VITALS — BP 145/90 | HR 85 | Temp 97.6°F | Ht 65.0 in | Wt 168.1 lb

## 2021-12-10 DIAGNOSIS — R609 Edema, unspecified: Secondary | ICD-10-CM | POA: Diagnosis not present

## 2021-12-10 DIAGNOSIS — I10 Essential (primary) hypertension: Secondary | ICD-10-CM

## 2021-12-10 MED ORDER — AMLODIPINE BESYLATE 10 MG PO TABS: 10.0000 mg | ORAL_TABLET | Freq: Every day | ORAL | 2 refills | Status: DC

## 2021-12-10 NOTE — Progress Notes (Signed)
[place], [place]  [address] Phone:  [phone]   Fax:  [phone] Subjective:   Patient ID: Barbara Rice, female    DOB: 1996/08/20, 25 y.o.   MRN: [id number]  Chief Complaint  Patient presents with   Follow-up    Pt states she still have a lot of leg pain rt leg swelling. Pt states she hasn't see a change with taking the lasix   HPI Barbara Rice 25 y.o. female with no significant medical history to the [facility] for reevaluation of bilateral ankle swelling and pain.   Patient states that she has had moderate improvement of swelling and pain in left ankle since starting medication. Continues to have moderate swelling in right ankle. Pain and swelling increases with prolonged standing at work. States," I notice that the swelling isn't as bad when I'm off from work for a few days." Endorses wearing compression stockings regularly, does not adhere to any specific diet, but stays active daily.   Denies any numbness and/ tingling. Denies any other complaints today. Denies any fever. Denies any fatigue, chest pain, shortness of breath, HA or dizziness. Denies any numbness or tingling.   Past Medical History:  Diagnosis Date   Sciatic nerve pain     No past surgical history on file.  Family History  Problem Relation Age of Onset   Cancer Father    Hypertension Father    Cancer Paternal Aunt    Cancer Paternal Uncle     Social History   Socioeconomic History   Marital status: Married    Spouse name: Not on file   Number of children: Not on file   Years of education: Not on file   Highest education level: Not on file  Occupational History   Not on file  Tobacco Use   Smoking status: Never   Smokeless tobacco: Never  Vaping Use   Vaping Use: Never used  Substance and Sexual Activity   Alcohol use: Never   Drug use: Never   Sexual activity: Not Currently  Other Topics Concern   Not on file  Social History  Narrative   Not on file   Social Determinants of Health   Financial Resource Strain: Not on file  Food Insecurity: Not on file  Transportation Needs: Not on file  Physical Activity: Not on file  Stress: Not on file  Social Connections: Not on file  Intimate Partner Violence: Not on file    Outpatient Medications Prior to Visit  Medication Sig Dispense Refill   acetaminophen-codeine (TYLENOL #3) 300-30 MG tablet Take 1 tablet by mouth every 4 (four) hours as needed for moderate pain. 30 tablet 0   furosemide (LASIX) 40 MG tablet Take 1 tablet (40 mg total) by mouth daily. 30 tablet 3   ibuprofen (ADVIL) 600 MG tablet Take 1 tablet (600 mg total) by mouth every 6 (six) hours as needed. 30 tablet 0   SODIUM FLUORIDE, DENTAL RINSE, 0.2 % SOLN Take by mouth as directed.     cyclobenzaprine (FLEXERIL) 5 MG tablet Take 1 tablet (5 mg total) by mouth 3 (three) times daily as needed for muscle spasms. (Patient not taking: Reported on 12/10/2021) 20 tablet 0   predniSONE (STERAPRED UNI-PAK 21 TAB) 10 MG (21) TBPK tablet 6 tabs for 1 day, then 5 tabs for 1 das, then 4 tabs for 1 day, then 3 tabs for 1 day, 2 tabs for 1 day, then 1 tab for 1  day (Patient not taking: Reported on 12/10/2021) 21 tablet 0   No facility-administered medications prior to visit.    No Known Allergies  Review of Systems  Constitutional:  Negative for chills, fever and malaise/fatigue.  Eyes: Negative.   Respiratory:  Negative for cough and shortness of breath.   Cardiovascular:  Negative for chest pain, palpitations and leg swelling.       See HPI  Gastrointestinal:  Negative for abdominal pain, blood in stool, constipation, diarrhea, nausea and vomiting.  Musculoskeletal:        See HPI  Skin: Negative.   Neurological: Negative.   Psychiatric/Behavioral:  Negative for depression. The patient is not nervous/anxious.   All other systems reviewed and are negative.     Objective:    Physical Exam Vitals  reviewed.  Constitutional:      General: She is not in acute distress.    Appearance: Normal appearance. She is normal weight.  HENT:     Head: Normocephalic.  Cardiovascular:     Rate and Rhythm: Normal rate and regular rhythm.     Pulses: Normal pulses.     Heart sounds: Normal heart sounds.     Comments: No obvious peripheral edema Pulmonary:     Effort: Pulmonary effort is normal.     Breath sounds: Normal breath sounds.  Musculoskeletal:        General: Swelling present. No tenderness, deformity or signs of injury. Normal range of motion.     Right lower leg: Edema present.     Left lower leg: Edema present.  Skin:    General: Skin is warm and dry.     Capillary Refill: Capillary refill takes less than 2 seconds.  Neurological:     General: No focal deficit present.     Mental Status: She is alert and oriented to person, place, and time.  Psychiatric:        Mood and Affect: Mood normal.        Behavior: Behavior normal.        Thought Content: Thought content normal.        Judgment: Judgment normal.    BP (!) 145/90    Pulse 85    Temp 97.6 F (36.4 C)    Ht 5' 5"  (1.651 m)    Wt 168 lb 2 oz (76.3 kg)    LMP 12/07/2021    SpO2 100%    BMI 27.98 kg/m  Wt Readings from Last 3 Encounters:  12/10/21 168 lb 2 oz (76.3 kg)  11/29/21 167 lb 6 oz (75.9 kg)  05/30/20 155 lb (70.3 kg)    Immunization History  Administered Date(s) Administered   Influenza-Unspecified 10/09/2021    Diabetic Foot Exam - Simple   No data filed     Rice Results  Component Value Date   TSH 0.466 11/29/2021   Rice Results  Component Value Date   WBC 4.6 11/29/2021   HGB 13.3 11/29/2021   HCT 39.7 11/29/2021   MCV 87 11/29/2021   PLT 127 (L) 11/29/2021   Rice Results  Component Value Date   NA 138 11/29/2021   K 4.1 11/29/2021   CO2 23 11/29/2021   GLUCOSE 84 11/29/2021   BUN 8 11/29/2021   CREATININE 0.88 11/29/2021   BILITOT 0.4 11/29/2021   ALKPHOS 50 11/29/2021   AST 18  11/29/2021   ALT 11 11/29/2021   PROT 7.5 11/29/2021   ALBUMIN 5.1 (H) 11/29/2021   CALCIUM 10.2 11/29/2021  EGFR 92 11/29/2021   Rice Results  Component Value Date   CHOL 227 (H) 11/29/2021   CHOL 231 (H) 11/29/2021   Rice Results  Component Value Date   HDL 78 11/29/2021   HDL 81 11/29/2021   Rice Results  Component Value Date   LDLCALC 133 (H) 11/29/2021   LDLCALC 133 (H) 11/29/2021   Rice Results  Component Value Date   TRIG 92 11/29/2021   TRIG 96 11/29/2021   Rice Results  Component Value Date   CHOLHDL 2.9 11/29/2021   CHOLHDL 2.9 11/29/2021   Rice Results  Component Value Date   HGBA1C 5.6 11/29/2021   HGBA1C WILL FOLLOW 11/29/2021       Assessment & Plan:   Problem List Items Addressed This Visit   None Visit Diagnoses     Peripheral edema    -  Primary Suspect peripheral edema related to hypertension Informed to continue taking Lasix as prescribed  Discussed at length diet options    Primary hypertension       Relevant Medications   amLODipine (NORVASC) 10 MG tablet Discussed the importance of initiating medications for hypertension Discussed possible complications of untreated hypertension Encouraged continued diet and exercise efforts  Encouraged continued compliance with medication     Follow up in 1 mth for Pap smear and reevaluation of hypertension and peripheral edema, sooner as needed     I am having Sokhna Mcnabb start on amLODipine. I am also having her maintain her acetaminophen-codeine, predniSONE, ibuprofen, cyclobenzaprine, SODIUM FLUORIDE (DENTAL RINSE), and furosemide.  Meds ordered this encounter  Medications   amLODipine (NORVASC) 10 MG tablet    Sig: Take 1 tablet (10 mg total) by mouth daily.    Dispense:  30 tablet    Refill:  2     Teena Dunk, NP

## 2021-12-10 NOTE — Patient Instructions (Signed)
You were seen today in the [facility] for reevaluation of peripheral edema. You were prescribed medications, please take as directed. Please follow up in 1 mth for reevaluation of B/P.

## 2021-12-31 ENCOUNTER — Ambulatory Visit: Payer: 59 | Admitting: Nurse Practitioner

## 2022-01-10 ENCOUNTER — Ambulatory Visit: Payer: 59 | Admitting: Nurse Practitioner

## 2022-01-14 ENCOUNTER — Encounter: Payer: Self-pay | Admitting: Nurse Practitioner

## 2022-01-14 ENCOUNTER — Ambulatory Visit (INDEPENDENT_AMBULATORY_CARE_PROVIDER_SITE_OTHER): Payer: 59 | Admitting: Nurse Practitioner

## 2022-01-14 ENCOUNTER — Other Ambulatory Visit: Payer: Self-pay

## 2022-01-14 ENCOUNTER — Telehealth: Payer: Self-pay | Admitting: Oncology

## 2022-01-14 VITALS — BP 130/78 | HR 84 | Temp 97.9°F | Ht 65.0 in | Wt 169.8 lb

## 2022-01-14 DIAGNOSIS — Z01419 Encounter for gynecological examination (general) (routine) without abnormal findings: Secondary | ICD-10-CM

## 2022-01-14 DIAGNOSIS — I10 Essential (primary) hypertension: Secondary | ICD-10-CM

## 2022-01-14 DIAGNOSIS — M7989 Other specified soft tissue disorders: Secondary | ICD-10-CM | POA: Diagnosis not present

## 2022-01-14 DIAGNOSIS — K5909 Other constipation: Secondary | ICD-10-CM

## 2022-01-14 LAB — RESULTS CONSOLE HPV: CHL HPV: NEGATIVE

## 2022-01-14 LAB — HM PAP SMEAR

## 2022-01-14 MED ORDER — AMLODIPINE BESYLATE 10 MG PO TABS: 10.0000 mg | ORAL_TABLET | Freq: Every day | ORAL | 2 refills | Status: DC

## 2022-01-14 MED ORDER — FUROSEMIDE 40 MG PO TABS: 40.0000 mg | ORAL_TABLET | Freq: Every day | ORAL | 3 refills | Status: DC

## 2022-01-14 MED ORDER — DOCUSATE SODIUM 100 MG PO CAPS: 100.0000 mg | ORAL_CAPSULE | Freq: Two times a day (BID) | ORAL | 0 refills | Status: DC

## 2022-01-14 NOTE — Telephone Encounter (Signed)
[facility] Scheduling Phone Note  I contacted Barbara Rice regarding a referral from Bo Merino, NP for purpose of evaluation and work up of leg swelling and lymphadenopathy noted on an 11/02/2021 cardiovascular ultrasound.  Barbara Rice was aware of her referral and reason.  Information on reason for referral was not necessary.  Patient was informed about the [facility] and the intent being to complete a diagnostic/prognostic work-up for her suspicious findings.  She is aware her appointment is with our Physician Assistant to identify a diagnostic plan of care and to arrange further oncologist or specialist care as indicated.  I confirmed with Barbara Rice she has transportation to her [facility] appointment - patient states she works at [employer].  Patient is aware to bring a list of medications, as well as insurance cards.  Visitor policy and COVID protocol reviewed with patient as well, including what to expect on arrival to the [facility] [place] regarding check-in process, visit, and potential for labs afterwards.  We look forward to meeting Barbara Rice and completing her diagnostic work-up and arranging her subsequent appointment with our oncologist team.  Diagnostic Clinic Specific Info:  Best contact/way to contact patient:  Cell CHL updated to reflect?:  not applicable Is there a HC POA?:  No  Location of Diagnostic Clinic Appointment and Contact Info Provided to Patient: [place] [facility] [place], [place] [address] [phone]   Reason for Referral, Clinical Information:  11/02/2021 VAS [place] LOWER EXTREMITY VENOUS (DVT)    Summary:  BILATERAL:  - No evidence of deep vein thrombosis seen in the lower extremities,  bilaterally.  -No evidence of popliteal cyst, bilaterally.  RIGHT:  - Ultrasound characteristics of enlarged lymph nodes are noted in the   groin.

## 2022-01-14 NOTE — Patient Instructions (Signed)
You were seen today in the [facility] for women's wellness exam and reevaluation of swelling. Labs were collected, results will be available via MyChart or, if abnormal, you will be contacted by clinic staff. You were prescribed medications, please take as directed. Please follow up in 3 mths for reevaluation swelling and constipation.

## 2022-01-14 NOTE — Progress Notes (Signed)
Barbara Rice, [place]  [address] Phone:  [phone]   Fax:  [phone] Subjective:   Patient ID: Barbara Rice, female    DOB: 12/15/1996, 26 y.o.   MRN: [id number]  Chief Complaint  Patient presents with   Follow-up    Pt is here today for her 1 month follow up visit. Pt states that she thinks that her upper left arm is swollen but she is not sure.     HPI Barbara Rice 26 y.o. female  has a past medical history of Sciatic nerve pain. To the [facility] for revaluation of peripheral edema and pap smear.  Patient states that previous visit, swelling has improved. Continues to have some swelling in the right ankle and left knee. Suspects that swelling has improved due to the medications and working reduced hours. Patient also endorses improvement in B/P.   Concerned about diffuse aching pain with bowel movement, has had in the past but worsened recently. States that symptoms have occurred twice in the past month. Denies any constipation or straining with bowel movement. Denies any blood in stool, dizziness, HA or lightheadedness. Amount of bowel movements per week unknown. Has not been evaluated for symptoms in the past.   Patient denies any other complaints. Requesting refill of medications, denies any noted side effects. Has not been sexually active in the past 6 mths, heterosexual preference. Endorses completing regular SBE at home. Patient has family history of ovarian and cervical cancer. Denies any vaginal concerns today.   Denies any other complaints today. Denies any fever. Denies any fatigue, chest pain, shortness of breath, HA or dizziness. Denies any blurred vision, numbness or tingling.  Past Medical History:  Diagnosis Date   Sciatic nerve pain     History reviewed. No pertinent surgical history.  Family History  Problem Relation Age of Onset   Cancer Father    Hypertension Father    Cancer Paternal Aunt    Cancer  Paternal Uncle     Social History   Socioeconomic History   Marital status: Married    Spouse name: Not on file   Number of children: Not on file   Years of education: Not on file   Highest education level: Not on file  Occupational History   Not on file  Tobacco Use   Smoking status: Never   Smokeless tobacco: Never  Vaping Use   Vaping Use: Never used  Substance and Sexual Activity   Alcohol use: Never   Drug use: Never   Sexual activity: Not Currently  Other Topics Concern   Not on file  Social History Narrative   Not on file   Social Determinants of Health   Financial Resource Strain: Not on file  Food Insecurity: Not on file  Transportation Needs: Not on file  Physical Activity: Not on file  Stress: Not on file  Social Connections: Not on file  Intimate Partner Violence: Not on file    Outpatient Medications Prior to Visit  Medication Sig Dispense Refill   SODIUM FLUORIDE, DENTAL RINSE, 0.2 % SOLN Take by mouth as directed.     amLODipine (NORVASC) 10 MG tablet Take 1 tablet (10 mg total) by mouth daily. 30 tablet 2   furosemide (LASIX) 40 MG tablet Take 1 tablet (40 mg total) by mouth daily. 30 tablet 3   acetaminophen-codeine (TYLENOL #3) 300-30 MG tablet Take 1 tablet by mouth every 4 (four) hours as needed for moderate pain. (Patient  not taking: Reported on 01/14/2022) 30 tablet 0   cyclobenzaprine (FLEXERIL) 5 MG tablet Take 1 tablet (5 mg total) by mouth 3 (three) times daily as needed for muscle spasms. (Patient not taking: Reported on 12/10/2021) 20 tablet 0   ibuprofen (ADVIL) 600 MG tablet Take 1 tablet (600 mg total) by mouth every 6 (six) hours as needed. (Patient not taking: Reported on 01/14/2022) 30 tablet 0   predniSONE (STERAPRED UNI-PAK 21 TAB) 10 MG (21) TBPK tablet 6 tabs for 1 day, then 5 tabs for 1 das, then 4 tabs for 1 day, then 3 tabs for 1 day, 2 tabs for 1 day, then 1 tab for 1 day (Patient not taking: Reported on 12/10/2021) 21 tablet 0    No facility-administered medications prior to visit.    No Known Allergies  Review of Systems  Constitutional:  Negative for chills, fever and malaise/fatigue.  Respiratory:  Negative for cough and shortness of breath.   Cardiovascular:  Positive for leg swelling. Negative for chest pain and palpitations.  Gastrointestinal:  Negative for abdominal pain, blood in stool, constipation, diarrhea, nausea and vomiting.       See HPI  Genitourinary: Negative.   Musculoskeletal: Negative.   Skin: Negative.   Neurological: Negative.   Psychiatric/Behavioral:  Negative for depression. The patient is not nervous/anxious.   All other systems reviewed and are negative.     Objective:    Physical Exam Vitals reviewed. Exam conducted with a chaperone present.  Constitutional:      General: She is not in acute distress.    Appearance: Normal appearance. She is normal weight.  HENT:     Head: Normocephalic.  Cardiovascular:     Rate and Rhythm: Normal rate and regular rhythm.     Pulses: Normal pulses.     Heart sounds: Normal heart sounds.  Pulmonary:     Effort: Pulmonary effort is normal.     Breath sounds: Normal breath sounds.  Genitourinary:    Vagina: Normal.     Cervix: Normal.     Uterus: Normal.      Adnexa: Right adnexa normal and left adnexa normal.  Musculoskeletal:        General: No tenderness, deformity or signs of injury. Normal range of motion.     Right lower leg: Edema present.     Left lower leg: Edema present.     Comments: Mild diffuse swelling in the right ankle and left knee  Skin:    General: Skin is warm and dry.     Capillary Refill: Capillary refill takes less than 2 seconds.  Neurological:     General: No focal deficit present.     Mental Status: She is alert and oriented to person, place, and time.  Psychiatric:        Mood and Affect: Mood normal.        Behavior: Behavior normal.        Thought Content: Thought content normal.         Judgment: Judgment normal.    BP 130/78    Pulse 84    Temp 97.9 F (36.6 C)    Ht _0  (1.651 m)    Wt 169 lb 12.8 oz (77 kg)    SpO2 98%    BMI 28.26 kg/m  Wt Readings from Last 3 Encounters:  01/14/22 169 lb 12.8 oz (77 kg)  12/10/21 168 lb 2 oz (76.3 kg)  11/29/21 167 lb 6 oz (75.9 kg)  Immunization History  Administered Date(s) Administered   Influenza-Unspecified 10/09/2021    Diabetic Foot Exam - Simple   No data filed     Rice Results  Component Value Date   TSH 0.466 11/29/2021   Rice Results  Component Value Date   WBC 4.6 11/29/2021   HGB 13.3 11/29/2021   HCT 39.7 11/29/2021   MCV 87 11/29/2021   PLT 127 (L) 11/29/2021   Rice Results  Component Value Date   NA 138 11/29/2021   K 4.1 11/29/2021   CO2 23 11/29/2021   GLUCOSE 84 11/29/2021   BUN 8 11/29/2021   CREATININE 0.88 11/29/2021   BILITOT 0.4 11/29/2021   ALKPHOS 50 11/29/2021   AST 18 11/29/2021   ALT 11 11/29/2021   PROT 7.5 11/29/2021   ALBUMIN 5.1 (H) 11/29/2021   CALCIUM 10.2 11/29/2021   EGFR 92 11/29/2021   Rice Results  Component Value Date   CHOL 227 (H) 11/29/2021   CHOL 231 (H) 11/29/2021   Rice Results  Component Value Date   HDL 78 11/29/2021   HDL 81 11/29/2021   Rice Results  Component Value Date   LDLCALC 133 (H) 11/29/2021   LDLCALC 133 (H) 11/29/2021   Rice Results  Component Value Date   TRIG 92 11/29/2021   TRIG 96 11/29/2021   Rice Results  Component Value Date   CHOLHDL 2.9 11/29/2021   CHOLHDL 2.9 11/29/2021   Rice Results  Component Value Date   HGBA1C 5.6 11/29/2021   HGBA1C WILL FOLLOW 11/29/2021       Assessment & Plan:   Problem List Items Addressed This Visit   None Visit Diagnoses     Leg swelling    -  Primary   Relevant Medications   furosemide (LASIX) 40 MG tablet   Other Relevant Orders   Ambulatory referral to Hematology / Oncology, evaluation for possible lymphadenopathy   Other constipation       Relevant Medications    docusate sodium (COLACE) 100 MG capsule Will reevaluate at follow up, may require follow up to GI   Women's annual routine gynecological examination       Relevant Orders   Pap IG and Chlamydia/Gonococcus, NAA ([facility])   NuSwab Vaginitis Plus (VG+)   HIV antibody (with reflex) Discussed safe sex practices    Primary hypertension       Relevant Medications   furosemide (LASIX) 40 MG tablet   amLODipine (NORVASC) 10 MG tablet Encouraged continued diet and exercise efforts  Encouraged continued compliance with medication. Patient requested to maintain current regimen of B/P medications.   Follow up in 3 mths for reevaluation of swelling and constipation, sooner as needed     I am having Tiaria Pottle start on docusate sodium. I am also having her maintain her acetaminophen-codeine, predniSONE, ibuprofen, cyclobenzaprine, SODIUM FLUORIDE (DENTAL RINSE), furosemide, and amLODipine.  Meds ordered this encounter  Medications   docusate sodium (COLACE) 100 MG capsule    Sig: Take 1 capsule (100 mg total) by mouth 2 (two) times daily.    Dispense:  10 capsule    Refill:  0   furosemide (LASIX) 40 MG tablet    Sig: Take 1 tablet (40 mg total) by mouth daily.    Dispense:  30 tablet    Refill:  3   amLODipine (NORVASC) 10 MG tablet    Sig: Take 1 tablet (10 mg total) by mouth daily.    Dispense:  30 tablet  Refill:  2     Teena Dunk, NP

## 2022-01-17 ENCOUNTER — Other Ambulatory Visit: Payer: Self-pay | Admitting: Nurse Practitioner

## 2022-01-17 DIAGNOSIS — R87611 Atypical squamous cells cannot exclude high grade squamous intraepithelial lesion on cytologic smear of cervix (ASC-H): Secondary | ICD-10-CM

## 2022-01-17 LAB — NUSWAB VAGINITIS PLUS (VG+)
Candida albicans, NAA: NEGATIVE
Candida glabrata, NAA: NEGATIVE
Chlamydia trachomatis, NAA: NEGATIVE
Neisseria gonorrhoeae, NAA: NEGATIVE
Trich vag by NAA: NEGATIVE

## 2022-01-17 LAB — PAP IG AND CT-NG NAA
Chlamydia, Nuc. Acid Amp: NEGATIVE
Gonococcus by Nucleic Acid Amp: NEGATIVE

## 2022-01-22 ENCOUNTER — Inpatient Hospital Stay: Payer: 59 | Attending: Physician Assistant | Admitting: Physician Assistant

## 2022-01-22 ENCOUNTER — Other Ambulatory Visit: Payer: Self-pay | Admitting: Hematology and Oncology

## 2022-01-22 ENCOUNTER — Encounter: Payer: Self-pay | Admitting: Physician Assistant

## 2022-01-22 ENCOUNTER — Other Ambulatory Visit: Payer: Self-pay

## 2022-01-22 ENCOUNTER — Inpatient Hospital Stay: Payer: 59

## 2022-01-22 VITALS — BP 123/80 | HR 73 | Temp 97.5°F | Resp 17 | Wt 170.0 lb

## 2022-01-22 DIAGNOSIS — R590 Localized enlarged lymph nodes: Secondary | ICD-10-CM | POA: Insufficient documentation

## 2022-01-22 DIAGNOSIS — I10 Essential (primary) hypertension: Secondary | ICD-10-CM

## 2022-01-22 DIAGNOSIS — M5430 Sciatica, unspecified side: Secondary | ICD-10-CM | POA: Insufficient documentation

## 2022-01-22 DIAGNOSIS — Z79899 Other long term (current) drug therapy: Secondary | ICD-10-CM | POA: Diagnosis not present

## 2022-01-22 LAB — CBC WITH DIFFERENTIAL (CANCER CENTER ONLY)
Abs Immature Granulocytes: 0.01 10*3/uL (ref 0.00–0.07)
Basophils Absolute: 0 10*3/uL (ref 0.0–0.1)
Basophils Relative: 1 %
Eosinophils Absolute: 0.1 10*3/uL (ref 0.0–0.5)
Eosinophils Relative: 1 %
HCT: 35.8 % — ABNORMAL LOW (ref 36.0–46.0)
Hemoglobin: 11.6 g/dL — ABNORMAL LOW (ref 12.0–15.0)
Immature Granulocytes: 0 %
Lymphocytes Relative: 50 %
Lymphs Abs: 1.8 10*3/uL (ref 0.7–4.0)
MCH: 28.4 pg (ref 26.0–34.0)
MCHC: 32.4 g/dL (ref 30.0–36.0)
MCV: 87.7 fL (ref 80.0–100.0)
Monocytes Absolute: 0.3 10*3/uL (ref 0.1–1.0)
Monocytes Relative: 7 %
Neutro Abs: 1.5 10*3/uL — ABNORMAL LOW (ref 1.7–7.7)
Neutrophils Relative %: 41 %
Platelet Count: 169 10*3/uL (ref 150–400)
RBC: 4.08 MIL/uL (ref 3.87–5.11)
RDW: 12 % (ref 11.5–15.5)
WBC Count: 3.6 10*3/uL — ABNORMAL LOW (ref 4.0–10.5)
nRBC: 0 % (ref 0.0–0.2)

## 2022-01-22 LAB — CMP (CANCER CENTER ONLY)
ALT: 12 U/L (ref 0–44)
AST: 21 U/L (ref 15–41)
Albumin: 4.5 g/dL (ref 3.5–5.0)
Alkaline Phosphatase: 43 U/L (ref 38–126)
Anion gap: 9 (ref 5–15)
BUN: 10 mg/dL (ref 6–20)
CO2: 25 mmol/L (ref 22–32)
Calcium: 9.7 mg/dL (ref 8.9–10.3)
Chloride: 102 mmol/L (ref 98–111)
Creatinine: 0.8 mg/dL (ref 0.44–1.00)
GFR, Estimated: >60 mL/min (ref >60)
Glucose, Bld: 83 mg/dL (ref 70–99)
Potassium: 4.1 mmol/L (ref 3.5–5.1)
Sodium: 136 mmol/L (ref 135–145)
Total Bilirubin: 0.7 mg/dL (ref 0.3–1.2)
Total Protein: 8.2 g/dL — ABNORMAL HIGH (ref 6.5–8.1)

## 2022-01-22 LAB — HIV ANTIBODY (ROUTINE TESTING W REFLEX): HIV Screen 4th Generation wRfx: NONREACTIVE

## 2022-01-22 LAB — LACTATE DEHYDROGENASE: LDH: 175 U/L (ref 98–192)

## 2022-01-22 LAB — SEDIMENTATION RATE: Sed Rate: 12 mm/hr (ref 0–22)

## 2022-01-22 LAB — C-REACTIVE PROTEIN: CRP: 0.6 mg/dL (ref <1.0)

## 2022-01-22 NOTE — Progress Notes (Signed)
[place] [phone]   Fax:([phone]  INITIAL CONSULTATION:  Patient Care Team: Teena Dunk, NP as PCP - General (Nurse Practitioner)  CHIEF COMPLAINTS/PURPOSE OF CONSULTATION:  Right inguinal lymphadenopathy  HISTORY OF PRESENTING ILLNESS:  Barbara Rice 26 y.o. female with medical history significant for sciatic nerve pain. She is unaccompanied for this visit.   On review of the previous records, [name] presented to her PCP for lower extremity edema for 1-2 weeks. Doppler US of the lower extremities were obtained on 11/02/2021. Findings revealed enlarged lymph nodes in the right groin.   On exam today, [name] reports that her energy levels and appetite are unchanged. She is able to complete all her daily activities on her own. She continues to have lower extremity edema, right greater than left. She reports the swelling comes and goes but worsens when she stands for long periods. He denise any pain or redness to the legs. He denies any nausea, vomiting or abdominal pain. Her bowel habits are regular without any diarrhea or constipation. She denies fevers, chills, sweats, shortness of breath, chest pain, cough, urinary symptoms or vaginal symptoms. She has no other complaints. Remaining 10 point ROS is below.   MEDICAL HISTORY:  Past Medical History:  Diagnosis Date   Sciatic nerve pain     SURGICAL HISTORY: History reviewed. No pertinent surgical history.  SOCIAL HISTORY: Social History   Socioeconomic History   Marital status: Married    Spouse name: Not on file   Number of children: Not on file   Years of education: Not on file   Highest education level: Not on file  Occupational History   Not on file  Tobacco Use   Smoking status: Never   Smokeless tobacco: Never  Vaping Use   Vaping Use: Never used  Substance and Sexual Activity   Alcohol use: Never   Drug use: Never   Sexual  activity: Not Currently  Other Topics Concern   Not on file  Social History Narrative   Not on file   Social Determinants of Health   Financial Resource Strain: Not on file  Food Insecurity: Not on file  Transportation Needs: Not on file  Physical Activity: Not on file  Stress: Not on file  Social Connections: Not on file  Intimate Partner Violence: Not on file    FAMILY HISTORY: Family History  Problem Relation Age of Onset   Prostatitis Father    Hypertension Father    Cervical cancer Paternal Aunt    Ovarian cancer Paternal Aunt    Cancer Cousin     ALLERGIES:  has No Known Allergies.  MEDICATIONS:  Current Outpatient Medications  Medication Sig Dispense Refill   amLODipine (NORVASC) 10 MG tablet Take 1 tablet (10 mg total) by mouth daily. 30 tablet 2   docusate sodium (COLACE) 100 MG capsule Take 1 capsule (100 mg total) by mouth 2 (two) times daily. 10 capsule 0   furosemide (LASIX) 40 MG tablet Take 1 tablet (40 mg total) by mouth daily. 30 tablet 3   ibuprofen (ADVIL) 600 MG tablet Take 1 tablet (600 mg total) by mouth every 6 (six) hours as needed. (Patient taking differently: Take 400 mg by mouth every 6 (six) hours as needed. OTC) 30 tablet 0   SODIUM FLUORIDE, DENTAL RINSE, 0.2 % SOLN Take by mouth as directed.     acetaminophen-codeine (TYLENOL #3) 300-30 MG tablet Take 1 tablet by mouth every 4 (four)  hours as needed for moderate pain. (Patient not taking: Reported on 01/14/2022) 30 tablet 0   cyclobenzaprine (FLEXERIL) 5 MG tablet Take 1 tablet (5 mg total) by mouth 3 (three) times daily as needed for muscle spasms. (Patient not taking: Reported on 12/10/2021) 20 tablet 0   No current facility-administered medications for this visit.    REVIEW OF SYSTEMS:   Constitutional: ( - ) fevers, ( - )  chills , ( - ) night sweats Eyes: ( - ) blurriness of vision, ( - ) double vision, ( - ) watery eyes Ears, nose, mouth, throat, and face: ( - ) mucositis, ( - ) sore  throat Respiratory: ( - ) cough, ( - ) dyspnea, ( - ) wheezes Cardiovascular: ( - ) palpitation, ( - ) chest discomfort, ( + ) lower extremity swelling Gastrointestinal:  ( - ) nausea, ( - ) heartburn, ( - ) change in bowel habits Skin: ( - ) abnormal skin rashes Lymphatics: ( - ) new lymphadenopathy, ( - ) easy bruising Neurological: ( - ) numbness, ( - ) tingling, ( - ) new weaknesses Behavioral/Psych: ( - ) mood change, ( - ) new changes  All other systems were reviewed with the patient and are negative.  PHYSICAL EXAMINATION: ECOG PERFORMANCE STATUS: 1 - Symptomatic but completely ambulatory  Vitals:   01/22/22 1321  BP: 123/80  Pulse: 73  Resp: 17  Temp: (!) 97.5 F (36.4 C)  SpO2: 100%   Filed Weights   01/22/22 1321  Weight: 170 lb (77.1 kg)    GENERAL: well appearing female in NAD  SKIN: skin color, texture, turgor are normal, no rashes or significant lesions EYES: conjunctiva are pink and non-injected, sclera clear OROPHARYNX: no exudate, no erythema; lips, buccal mucosa, and tongue normal  NECK: supple, non-tender LYMPH:  no palpable lymphadenopathy in the cervical, axillary or supraclavicular lymph nodes.  LUNGS: clear to auscultation and percussion with normal breathing effort HEART: regular rate & rhythm and no murmurs. Trace bilateral lower extremity non-pitting edema.  ABDOMEN: soft, non-tender, non-distended, normal bowel sounds Musculoskeletal: no cyanosis of digits and no clubbing  PSYCH: alert & oriented x 3, fluent speech NEURO: no focal motor/sensory deficits  LABORATORY DATA:  I have reviewed the data as listed CBC Latest Ref Rng & Units 01/22/2022 11/29/2021 11/29/2021  WBC 4.0 - 10.5 K/uL 3.6(L) 4.6 WILL FOLLOW  Hemoglobin 12.0 - 15.0 g/dL 11.6(L) 13.3 WILL FOLLOW  Hematocrit 36.0 - 46.0 % 35.8(L) 39.7 WILL FOLLOW  Platelets 150 - 400 K/uL 169 127(L) WILL FOLLOW    CMP Latest Ref Rng & Units 01/22/2022 11/29/2021 11/29/2021  Glucose 70 - 99 mg/dL 83  84 86  BUN 6 - 20 mg/dL 10 8 7   Creatinine 0.44 - 1.00 mg/dL 0.80 0.88 0.92  Sodium 135 - 145 mmol/L 136 138 136  Potassium 3.5 - 5.1 mmol/L 4.1 4.1 3.9  Chloride 98 - 111 mmol/L 102 101 100  CO2 22 - 32 mmol/L 25 23 22   Calcium 8.9 - 10.3 mg/dL 9.7 10.2 10.5(H)  Total Protein 6.5 - 8.1 g/dL 8.2(H) 7.5 8.1  Total Bilirubin 0.3 - 1.2 mg/dL 0.7 0.4 0.4  Alkaline Phos 38 - 126 U/L 43 50 53  AST 15 - 41 U/L 21 18 19   ALT 0 - 44 U/L 12 11 11      RADIOGRAPHIC STUDIES: I have personally reviewed the radiological images as listed and agreed with the findings in the report. No results found.  ASSESSMENT &  PLAN Barbara Rice is a 26 y.o. female who presents to the diagnostic clinic for evaluation of right inguinal lymphadenopathy. We reviewd underlying etiologies including infectious process, inflammatory process and lymphoproliferative process.   Patient will proceed with repeat pelvic ultrasound prior to consideration of excisional biopsy. She will undergo serologic evaluation today to check baseline labs.   #Right inguinal lymphadenopathy: --Labs today to check CBC, CMP, LDH, sedimentation rate, C-reactive protein, HIV serology --Ordered pelvic ultrasound, scheduled for 01/30/2022.  --If there is persistent inguinal lymphadenopathy, then referral to general surgery for excisional biopsy.  --RTC once workup is complete  Orders Placed This Encounter  Procedures   US Pelvis Complete    Standing Status:   Future    Standing Expiration Date:   01/22/2023    Order Specific Question:   Reason for Exam (SYMPTOM  OR DIAGNOSIS REQUIRED)    Answer:   evaluate enlarged right lymph nodes    Order Specific Question:   Preferred imaging location?    Answer:   [facility]   CBC with Differential (Cancer Center Only)    Standing Status:   Future    Number of Occurrences:   1    Standing Expiration Date:   01/22/2023   CMP ([place] only)    Standing Status:   Future    Number of  Occurrences:   1    Standing Expiration Date:   01/22/2023   Lactate dehydrogenase (LDH)    Standing Status:   Future    Number of Occurrences:   1    Standing Expiration Date:   01/22/2023   Sedimentation rate    Standing Status:   Future    Number of Occurrences:   1    Standing Expiration Date:   01/22/2023   C-reactive protein    Standing Status:   Future    Number of Occurrences:   1    Standing Expiration Date:   01/22/2023   HIV antibody (with reflex)    Standing Status:   Future    Number of Occurrences:   1    Standing Expiration Date:   01/22/2023    All questions were answered. The patient knows to call the clinic with any problems, questions or concerns.  I have spent a total of 60 minutes minutes of face-to-face and non-face-to-face time, preparing to see the patient, obtaining and/or reviewing separately obtained history, performing a medically appropriate examination, counseling and educating the patient, ordering tests, documenting clinical information in the electronic health record, and care coordination.   Dede Query, PA-C Department of Hematology/Oncology [place] at [facility] Phone: [phone]  Patient was seen with Dr. Lorenso Courier  I have read the above note and personally examined the patient. I agree with the assessment and plan as noted above.  Briefly Barbara Rice is a 26 year old female who presents for evaluation of inguinal lymphadenopathy.  She was referred to diagnostic clinic due to concern for right inguinal lymphadenopathy with concern for possible lymphoproliferative disorder.  At this time we recommend testing to include CBC, CMP and inflammatory markers with ESR and CRP.  We will order a pelvic ultrasound and if there is persistent lymphadenopathy we will pursue an excisional biopsy with the surgical service.  The patient voiced her understanding of this plan moving forward.   Ledell Peoples, MD Department of  Hematology/Oncology [place] at [facility] Phone: [phone] Pager: [phone] Email: [email or web address]

## 2022-01-23 ENCOUNTER — Telehealth: Payer: Self-pay | Admitting: *Deleted

## 2022-01-23 ENCOUNTER — Other Ambulatory Visit: Payer: Self-pay | Admitting: *Deleted

## 2022-01-23 NOTE — Telephone Encounter (Signed)
Called patient to make her aware of Ultrasound scheduled for 2/2 @ 9:30 and to arrive at [facility] @ 9 AM with a full bladder for exam.  Verbalized understanding.

## 2022-01-26 DIAGNOSIS — R590 Localized enlarged lymph nodes: Secondary | ICD-10-CM | POA: Insufficient documentation

## 2022-01-30 ENCOUNTER — Ambulatory Visit (HOSPITAL_COMMUNITY)
Admission: RE | Admit: 2022-01-30 | Discharge: 2022-01-30 | Disposition: A | Discharge status: Home / Self Care | Payer: 59 | Source: Ambulatory Visit | Attending: Physician Assistant | Admitting: Physician Assistant

## 2022-01-30 ENCOUNTER — Other Ambulatory Visit: Payer: Self-pay

## 2022-01-30 ENCOUNTER — Other Ambulatory Visit: Payer: Self-pay | Admitting: Physician Assistant

## 2022-01-30 DIAGNOSIS — R590 Localized enlarged lymph nodes: Secondary | ICD-10-CM | POA: Diagnosis present

## 2022-01-31 ENCOUNTER — Telehealth: Payer: Self-pay | Admitting: Physician Assistant

## 2022-01-31 ENCOUNTER — Telehealth: Payer: Self-pay

## 2022-01-31 DIAGNOSIS — R590 Localized enlarged lymph nodes: Secondary | ICD-10-CM

## 2022-01-31 NOTE — Telephone Encounter (Signed)
Please send STAT referral to [facility] [place] [facility] for excision biopsy of right inguinal lymph node  STAT Referral faxed and confirmation received

## 2022-01-31 NOTE — Telephone Encounter (Signed)
I called Barbara Rice to review the pelvis US results from 01/30/2022. Findings revealed a few lymph nodes are noted in the right groin measuring up to 2.5 x 0.5 x 1.1 cm. Similar-appearing lymph nodes are present in the left groin. Since the size of the lymph node has increased to 2.5 cm, the recommendation is an excisional biopsy. We will make a referral to [facility] to evaluate for biopsy.   Patient expressed understanding of the plan provided.

## 2022-02-05 NOTE — Telephone Encounter (Signed)
[facility] does not take pt's ins.   New STAT referral was faxed to [facility] ATTN Erick.  Confirmation received

## 2022-02-11 NOTE — Telephone Encounter (Signed)
Per Cathlean Cower at [facility] he has been waiting to see if insurance will cover her office visit.  I called back today and spoke with [place] since [place] is out of the office.  She said she would call the insurance company today and will let me know something tomorrow

## 2022-02-12 ENCOUNTER — Other Ambulatory Visit: Payer: Self-pay | Admitting: Physician Assistant

## 2022-02-12 DIAGNOSIS — R590 Localized enlarged lymph nodes: Secondary | ICD-10-CM

## 2022-02-12 NOTE — Telephone Encounter (Signed)
Per [place] at [facility] pt's ins is out of network.  Pt advised

## 2022-02-13 ENCOUNTER — Encounter (HOSPITAL_COMMUNITY): Payer: Self-pay

## 2022-02-13 NOTE — Progress Notes (Signed)
Barbara Rice Legal Sex  Female DOB  07/29/1996 SSN  [id number] Address  [address]  [place] [address] Phone  [phone] (Home)  [phone] (Mobile) *Preferred*    RE: [place] CORE BIOPSY Received: Barbara Chalet, DO  Barbara Rice OK for US guided FNA, possible core, of inguinal lymph node.     Discussed with referring.    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     Best for Barbara Rice day, however, any VIR can perform.   [address]ta [address]        Previous Messages   ----- Message -----  From: Barbara Rice  Sent: 02/12/2022   4:21 PM EST  To: [facility] Procedure Requests  Subject: [place] CORE BIOPSY                                 [place] CORE BIOPSY         Reason: Inguinal lymphadenopathy          History: [place] in computer          Provider: Briant Cedar

## 2022-02-19 ENCOUNTER — Other Ambulatory Visit: Payer: Self-pay | Admitting: Physician Assistant

## 2022-02-19 ENCOUNTER — Other Ambulatory Visit: Payer: Self-pay | Admitting: Radiology

## 2022-02-20 ENCOUNTER — Other Ambulatory Visit: Payer: Self-pay | Admitting: Physician Assistant

## 2022-02-20 ENCOUNTER — Encounter (HOSPITAL_COMMUNITY): Payer: Self-pay

## 2022-02-20 ENCOUNTER — Ambulatory Visit (HOSPITAL_COMMUNITY)
Admission: RE | Admit: 2022-02-20 | Discharge: 2022-02-20 | Disposition: A | Discharge status: Home / Self Care | Payer: 59 | Source: Ambulatory Visit | Attending: Physician Assistant | Admitting: Physician Assistant

## 2022-02-20 ENCOUNTER — Other Ambulatory Visit: Payer: Self-pay

## 2022-02-20 DIAGNOSIS — R531 Weakness: Secondary | ICD-10-CM | POA: Insufficient documentation

## 2022-02-20 DIAGNOSIS — R55 Syncope and collapse: Secondary | ICD-10-CM | POA: Insufficient documentation

## 2022-02-20 DIAGNOSIS — R590 Localized enlarged lymph nodes: Secondary | ICD-10-CM | POA: Insufficient documentation

## 2022-02-20 DIAGNOSIS — R600 Localized edema: Secondary | ICD-10-CM | POA: Insufficient documentation

## 2022-02-20 DIAGNOSIS — R251 Tremor, unspecified: Secondary | ICD-10-CM | POA: Insufficient documentation

## 2022-02-20 LAB — GLUCOSE, CAPILLARY
Glucose-Capillary: 82 mg/dL (ref 70–99)
Glucose-Capillary: 88 mg/dL (ref 70–99)

## 2022-02-20 LAB — CBC
HCT: 36.1 % (ref 36.0–46.0)
Hemoglobin: 11.4 g/dL — ABNORMAL LOW (ref 12.0–15.0)
MCH: 28.3 pg (ref 26.0–34.0)
MCHC: 31.6 g/dL (ref 30.0–36.0)
MCV: 89.6 fL (ref 80.0–100.0)
Platelets: 176 10*3/uL (ref 150–400)
RBC: 4.03 MIL/uL (ref 3.87–5.11)
RDW: 11.9 % (ref 11.5–15.5)
WBC: 3.2 10*3/uL — ABNORMAL LOW (ref 4.0–10.5)
nRBC: 0 % (ref 0.0–0.2)

## 2022-02-20 LAB — PREGNANCY, URINE: Preg Test, Ur: NEGATIVE

## 2022-02-20 LAB — PROTIME-INR
INR: 0.9 (ref 0.8–1.2)
Prothrombin Time: 12.1 seconds (ref 11.4–15.2)

## 2022-02-20 MED ORDER — FENTANYL CITRATE (PF) 100 MCG/2ML IJ SOLN
INTRAMUSCULAR | Status: AC
  Filled 2022-02-20: qty 2

## 2022-02-20 MED ORDER — SODIUM CHLORIDE 0.9 % IV SOLN: INTRAVENOUS | Status: DC

## 2022-02-20 MED ORDER — LIDOCAINE HCL (PF) 1 % IJ SOLN
INTRAMUSCULAR | Status: AC
  Filled 2022-02-20: qty 30

## 2022-02-20 MED ORDER — MIDAZOLAM HCL 2 MG/2ML IJ SOLN
INTRAMUSCULAR | Status: AC
  Filled 2022-02-20: qty 2

## 2022-02-20 MED ORDER — FENTANYL CITRATE (PF) 100 MCG/2ML IJ SOLN
INTRAMUSCULAR | Status: AC | PRN
  Administered 2022-02-20: 50 ug via INTRAVENOUS

## 2022-02-20 MED ORDER — IBUPROFEN 400 MG PO TABS
400.0000 mg | ORAL_TABLET | Freq: Once | ORAL | Status: AC
  Administered 2022-02-20: 400 mg via ORAL
  Filled 2022-02-20: qty 1

## 2022-02-20 MED ORDER — MIDAZOLAM HCL 2 MG/2ML IJ SOLN
INTRAMUSCULAR | Status: AC | PRN
  Administered 2022-02-20 (×2): 1 mg via INTRAVENOUS

## 2022-02-20 NOTE — Progress Notes (Signed)
Referring Physician(s): Briant Cedar  Supervising Physician: Gilmer Mor  Patient Status:  [facility]  Chief Complaint:  Right inguinal LN Bx this am  Subjective:  Pt did well after procedure today Received Versed 2 mg and Fentanyl 50 microgram prior to procedure Did well in SSC  Ate full meal of [place] sandwich and whole bag of pretzels; soda drink  Preparing to go home; pt was assisted to bathroom with RN While on toilet- RN noted change in pts - eyes rolled and become syncopal Never fully lost consciousness Did not fall  RN was with pt the whole time in RR  Was able to gather help from other nurses-- pt into wc and back to room Pt was able to get into the bed with assistance I was called to bedside    Allergies: Patient has no known allergies.  Medications: Prior to Admission medications   Medication Sig Start Date End Date Taking? Authorizing Provider  amLODipine (NORVASC) 10 MG tablet Take 1 tablet (10 mg total) by mouth daily. 01/14/22 04/14/22 Yes Passmore, Enid Derry I, NP  docusate sodium (COLACE) 100 MG capsule Take 1 capsule (100 mg total) by mouth 2 (two) times daily. 01/14/22  Yes Passmore, Enid Derry I, NP  furosemide (LASIX) 40 MG tablet Take 1 tablet (40 mg total) by mouth daily. 01/14/22  Yes Passmore, Tewana I, NP  SODIUM FLUORIDE, DENTAL RINSE, 0.2 % SOLN Take by mouth as directed. 07/18/21  Yes [provider]  acetaminophen-codeine (TYLENOL #3) 300-30 MG tablet Take 1 tablet by mouth every 4 (four) hours as needed for moderate pain. Patient not taking: Reported on 01/14/2022 11/12/15   Montez Morita, CNM  cyclobenzaprine (FLEXERIL) 5 MG tablet Take 1 tablet (5 mg total) by mouth 3 (three) times daily as needed for muscle spasms. Patient not taking: Reported on 12/10/2021 05/30/20   Merrilee Jansky, MD  ibuprofen (ADVIL) 600 MG tablet Take 1 tablet (600 mg total) by mouth every 6 (six) hours as needed. Patient taking differently: Take 400 mg by mouth  every 6 (six) hours as needed. OTC 05/30/20   Lamptey, Britta Mccreedy, MD     Vital Signs: BP (!) 115/95    Pulse 80    Temp 98.3 F (36.8 C) (Oral)    Resp 16    Ht 5\' 6"  (1.676 m)    Wt 170 lb (77.1 kg)    LMP 02/13/2022    SpO2 100%    BMI 27.44 kg/m   Able to answer some questions although speech seems strained--- not opening mouth well to form words Knows name; dob and place Answered no to pain No to N/V No to dizzy or headache  Glucose 82 initially---- 88   30 minutes later EOMI Face symmetrical Tongue midline Heart: RRR Lungs CTA Abd soft Rt groin site is c/d/I; no hematoma; soft Can move arms slowly--- grip is weal B Moves legs minimally--- unable to lift legs off bed; unable to heel to shin  Cannot hold legs off stretcher  I called Dr 02/15/2022 to evaluate pt  He was in room within just a few minutes Pt actually seeming some better at this point Speech is better Oriented Still difficult to move extremities and still movement is minimal  Dr Loreta Ave called for Neurologic consult  Imaging: No results found.  Labs:  CBC: Recent Labs    11/29/21 1514 11/29/21 1610 01/22/22 1450 02/20/22 0640  WBC WILL FOLLOW 4.6 3.6* 3.2*  HGB WILL FOLLOW 13.3  11.6* 11.4*  HCT WILL FOLLOW 39.7 35.8* 36.1  PLT WILL FOLLOW 127* 169 176    COAGS: Recent Labs    02/20/22 0640  INR 0.9    BMP: Recent Labs    11/29/21 1514 11/29/21 1610 01/22/22 1450  NA 136 138 136  K 3.9 4.1 4.1  CL 100 101 102  CO2 22 23 25   GLUCOSE 86 84 83  BUN 7 8 10   CALCIUM 10.5* 10.2 9.7  CREATININE 0.92 0.88 0.80  GFRNONAA  --   --  >60    LIVER FUNCTION TESTS: Recent Labs    11/29/21 1514 11/29/21 1610 01/22/22 1450  BILITOT 0.4 0.4 0.7  AST 19 18 21   ALT 11 11 12   ALKPHOS 53 50 43  PROT 8.1 7.5 8.2*  ALBUMIN 5.3* 5.1* 4.5    Assessment and Plan:  Dr 14/02/22 called Neuro Dr 01/24/22 Neurology exam is reassuring per MD He feels pt may be demonstrating signs of stress over  procedure and awaiting diagnosis Pt is aware of these findings and understands Dr in room for examination He has recommended ice pack to groin for any pain --- keep pt for another hour of observation and DC home if stable She and family member at bedside are in agreement with this plan  Electronically Signed: , PA-C 02/20/2022, 10:40 AM   I spent a total of 25 Minutes at the the patient's bedside AND on the patient's hospital floor or unit, greater than 50% of which was counseling/coordinating care for right inguinal LN bx- with post syncopal episode

## 2022-02-20 NOTE — Procedures (Signed)
Interventional Radiology Procedure Note  Procedure: US guided FNA of right inguinal lymph node.   Hx: no malignancy.  Swelling of the leg.   Complications: None EBL: None Specimen: Mx 25g FNA  Recommendations: - 1 hr dc home  - Routine wound care - Follow up pathology - Advance diet   Signed,  Gilmer Mor, DO

## 2022-02-20 NOTE — Consult Note (Addendum)
NEURO HOSPITALIST CONSULT NOTE   Requestig physician: Dr. Loreta Ave  Reason for Consult:Acute onset of tremulousness, weakness and decreased responsiveness   History obtained from:   Patient, Radiology Team and Chart     HPI:                                                                                                                                          Barbara Rice is an 26 y.o. female who presented today for radiologically-guided right inguinal lymph node biopsy. In the recovery area following the biopsy, the patient had sudden onset of leg tremors bilaterally while sitting down on a toilet to urinate. This was accompanied by eyelid fluttering and eyes rolled backwards in head. She was unresponsive to questions for a few seconds. Her acute change in neurological status required nursing staff to lift her off the toilet and help her back to her bed in the post-op unit.   Radiology PA note has been reviewed: "Pt did well after procedure today Received Versed 2 mg and Fentanyl 50 microgram prior to procedure Did well in SSC Ate full meal of [place] sandwich and whole bag of pretzels; soda drink Preparing to go home; pt was assisted to bathroom with RN While on toilet- RN noted change in pts - eyes rolled and become syncopal Never fully lost consciousness Did not fall RN was with pt the whole time in RR Was able to gather help from other nurses-- pt into wc and back to room Pt was able to get into the bed with assistance I was called to bedside."  Past Medical History:  Diagnosis Date   Sciatic nerve pain     History reviewed. No pertinent surgical history.  Family History  Problem Relation Age of Onset   Prostatitis Father    Hypertension Father    Cervical cancer Paternal Aunt    Ovarian cancer Paternal Aunt    Cancer Cousin              Social History:  reports that she has never smoked. She has never used smokeless tobacco. She reports that she does not  drink alcohol and does not use drugs.  No Known Allergies  HOME MEDICATIONS:  Current Outpatient Medications on File Prior to Encounter  Medication Sig Dispense Refill   amLODipine (NORVASC) 10 MG tablet Take 1 tablet (10 mg total) by mouth daily. 30 tablet 2   docusate sodium (COLACE) 100 MG capsule Take 1 capsule (100 mg total) by mouth 2 (two) times daily. 10 capsule 0   furosemide (LASIX) 40 MG tablet Take 1 tablet (40 mg total) by mouth daily. 30 tablet 3   SODIUM FLUORIDE, DENTAL RINSE, 0.2 % SOLN Take by mouth as directed.     acetaminophen-codeine (TYLENOL #3) 300-30 MG tablet Take 1 tablet by mouth every 4 (four) hours as needed for moderate pain. (Patient not taking: Reported on 01/14/2022) 30 tablet 0   cyclobenzaprine (FLEXERIL) 5 MG tablet Take 1 tablet (5 mg total) by mouth 3 (three) times daily as needed for muscle spasms. (Patient not taking: Reported on 12/10/2021) 20 tablet 0   ibuprofen (ADVIL) 600 MG tablet Take 1 tablet (600 mg total) by mouth every 6 (six) hours as needed. (Patient taking differently: Take 400 mg by mouth every 6 (six) hours as needed. OTC) 30 tablet 0   No current facility-administered medications on file prior to encounter.    Scheduled: Continuous:  sodium chloride       ROS:                                                                                                                                       Complains of some intermittent chest tightness and pain at her biopsy site. States that her legs feel weak. Comprehensive ROS otherwise negative.    Blood pressure (!) 115/95, pulse 80, temperature 98.3 F (36.8 C), temperature source Oral, resp. rate 16, height 5\' 6"  (1.676 m), weight 77.1 kg, last menstrual period 02/13/2022, SpO2 100 %.   General Examination:                                                                                                        Physical Exam  HEENT-  [place]/AT    Lungs- Respirations unlabored Extremities- No pedal edema  Neurological Examination Mental Status: Awake and alert. Affect changes from drowsy to dysthymic to cautiously alert, to tearful to confused-appearing rapidly during the interview. Speech is intact to all questions and commands without aphasia. Naming intact. Able to perform calculations without difficulty. Oriented x 5.  Cranial Nerves: II: Visual fields in all 4 quadrants of each eye. PERRL.  III,IV, VI: No ptosis. EOMI. One  episode of eyelid fluttering occurred for about 5 seconds in conjunction with a change in affect during a portion of the motor exam. The eyelid fluttering appeared functional/embellished.  V: Subjectively decreased temp sensation on the left.  VII: Smile symmetric VIII: Hearing intact to voice IX,X: No hoarseness or hypophonia XI: Symmetric XII: Midline tongue extension Motor: BUE with mild giveway weakness. Maximum strength elicited is 4+/5 proximally and distally; no asymmetry.  BLE motor assessment elicits abrupt changes in affect, ranging from anxious to tearful. The patient requires extensive reassurance and coaching to move each lower extremity, which she does slowly with normal fine motor control. Coaching improves performance of leg elevation bilaterally antigravity, as well as very slow performance of heel-shin testing. There is giveway weakness and patient begins to wince in pain when testing against resistance, stating that there is pain in her medial thighs on both sides (the biopsied right side in addition to the unbiopsied left side). Maximum BLE strength elicited is 4-/5 with significant giveway.  Sensory: Subjectively decreased LUE temp sensation. Normal temp sensation to BLE. FT intact x 4. No extinction to DSS.  Deep Tendon Reflexes: 2+ and symmetric bilateral brachioradialis, biceps and patellae. 1+ bilateral achilles.  Plantars:  Right: downgoing   Left: downgoing Cerebellar: No ataxia with FNF bilaterally. Slow, labored H-S bilaterally has a prominent functional quality.  Gait: Deferred   Lab Results: Basic Metabolic Panel: No results for input(s): NA, K, CL, CO2, GLUCOSE, BUN, CREATININE, CALCIUM, MG, PHOS in the last 168 hours.  CBC: Recent Labs  Lab 02/20/22 0640  WBC 3.2*  HGB 11.4*  HCT 36.1  MCV 89.6  PLT 176    Cardiac Enzymes: No results for input(s): CKTOTAL, CKMB, CKMBINDEX, TROPONINI in the last 168 hours.  Lipid Panel: No results for input(s): CHOL, TRIG, HDL, CHOLHDL, VLDL, LDLCALC in the last 168 hours.  Imaging: No results found.   Assessment: 26 year old female with acute episode of BLE > BUE tremulousness, weakness and decreased responsiveness  1. Exam reveals multiple functional attributes responding to coaching, as documented above. No localizable findings. Description of her spell is not consistent with a seizure. Symptoms not consistent with a stroke or spinal cord infarction.  2. Episode was most likely triggered by anxiety related to recent health concerns and the stress of undergoing biopsy. Discussed with patient. All questions answered.   Recommendations: 1. Observation.  2. Will need clearance by PT prior to discharge.  3. Discussed with Dr. Loreta Ave.    Electronically signed: Dr. Caryl Pina 02/20/2022, 10:38 AM

## 2022-02-20 NOTE — H&P (Signed)
Chief Complaint: Patient was seen in consultation today for right inguinal lymph node biopsy at the request of Briant Cedar [facility]  Supervising Physician: Gilmer Mor  Patient Status: [facility] - Out-pt  History of Present Illness: Barbara Rice is a 26 y.o. female  Pt was seen by PCP for Bilat leg swelling in November Symptoms for 2 weeks prior Doppler study then: Summary: BILATERAL: - No evidence of deep vein thrombosis seen in the lower extremities, bilaterally. -No evidence of popliteal cyst, bilaterally. RIGHT: - Ultrasound characteristics of enlarged lymph nodes are noted in the groin.  Pt was treated with Lasix and swelling has improved--- but Rt leg still larger per pt  PCP saw pt in follow up 01/22/22-- notes reflect continued swelling but better Still Rt worse than left  01/30/22 [place]:  A few lymph nodes are noted in the right groin measuring up to 2.5 x 0.5 x 1.1 cm. Similar-appearing lymph nodes are present in the left groin. No significant cortical thickening. No abnormal vascularity is identified. IMPRESSION: Lymph nodes in the inguinal regions bilaterally with no significant abnormal morphology.  Now scheduled for Right inguinal lymph node biopsy     Past Medical History:  Diagnosis Date   Sciatic nerve pain     History reviewed. No pertinent surgical history.  Allergies: Patient has no known allergies.  Medications: Prior to Admission medications   Medication Sig Start Date End Date Taking? Authorizing Provider  amLODipine (NORVASC) 10 MG tablet Take 1 tablet (10 mg total) by mouth daily. 01/14/22 04/14/22 Yes Passmore, Enid Derry I, NP  docusate sodium (COLACE) 100 MG capsule Take 1 capsule (100 mg total) by mouth 2 (two) times daily. 01/14/22  Yes Passmore, Enid Derry I, NP  furosemide (LASIX) 40 MG tablet Take 1 tablet (40 mg total) by mouth daily. 01/14/22  Yes Passmore, Tewana I, NP  SODIUM FLUORIDE, DENTAL RINSE, 0.2 % SOLN Take by mouth as directed.  07/18/21  Yes [provider]  acetaminophen-codeine (TYLENOL #3) 300-30 MG tablet Take 1 tablet by mouth every 4 (four) hours as needed for moderate pain. Patient not taking: Reported on 01/14/2022 11/12/15   Montez Morita, CNM  cyclobenzaprine (FLEXERIL) 5 MG tablet Take 1 tablet (5 mg total) by mouth 3 (three) times daily as needed for muscle spasms. Patient not taking: Reported on 12/10/2021 05/30/20   Merrilee Jansky, MD  ibuprofen (ADVIL) 600 MG tablet Take 1 tablet (600 mg total) by mouth every 6 (six) hours as needed. Patient taking differently: Take 400 mg by mouth every 6 (six) hours as needed. OTC 05/30/20   Lamptey, Britta Mccreedy, MD     Family History  Problem Relation Age of Onset   Prostatitis Father    Hypertension Father    Cervical cancer Paternal Aunt    Ovarian cancer Paternal Aunt    Cancer Cousin     Social History   Socioeconomic History   Marital status: Married    Spouse name: Not on file   Number of children: Not on file   Years of education: Not on file   Highest education level: Not on file  Occupational History   Not on file  Tobacco Use   Smoking status: Never   Smokeless tobacco: Never  Vaping Use   Vaping Use: Never used  Substance and Sexual Activity   Alcohol use: Never   Drug use: Never   Sexual activity: Not Currently  Other Topics Concern   Not on file  Social History Narrative  Not on file   Social Determinants of Health   Financial Resource Strain: Not on file  Food Insecurity: Not on file  Transportation Needs: Not on file  Physical Activity: Not on file  Stress: Not on file  Social Connections: Not on file    Review of Systems: A 12 point ROS discussed and pertinent positives are indicated in the HPI above.  All other systems are negative.  Review of Systems  Constitutional:  Negative for activity change, fatigue and fever.  Respiratory:  Negative for cough and shortness of breath.   Cardiovascular:  Positive for leg  swelling. Negative for chest pain.  Gastrointestinal:  Negative for abdominal pain and nausea.  Musculoskeletal:  Negative for back pain and gait problem.  Psychiatric/Behavioral:  Negative for behavioral problems and confusion.    Vital Signs: BP (!) 138/95    Pulse 71    Temp 98.3 F (36.8 C) (Oral)    Resp 16    Ht 5\' 6"  (1.676 m)    Wt 170 lb (77.1 kg)    LMP 02/13/2022    SpO2 100%    BMI 27.44 kg/m   Physical Exam Vitals reviewed.  HENT:     Mouth/Throat:     Mouth: Mucous membranes are moist.  Cardiovascular:     Rate and Rhythm: Normal rate and regular rhythm.     Heart sounds: Normal heart sounds.  Pulmonary:     Effort: Pulmonary effort is normal.     Breath sounds: Normal breath sounds.  Abdominal:     Palpations: Abdomen is soft.     Tenderness: There is no abdominal tenderness.  Musculoskeletal:        General: Normal range of motion.     Right lower leg: Edema present.     Left lower leg: Edema present.     Comments: Very mild finding  Skin:    General: Skin is warm.  Neurological:     Mental Status: She is alert and oriented to person, place, and time.  Psychiatric:        Behavior: Behavior normal.    Imaging: US PELVIS LIMITED (TRANSABDOMINAL ONLY)  Result Date: 01/31/2022 CLINICAL DATA:  Enlarged lymph nodes in the right groin. EXAM: US PELVIS LIMITED TECHNIQUE: Grayscale and color flow imaging of the right groin was performed. COMPARISON:  None are available for review. FINDINGS: A few lymph nodes are noted in the right groin measuring up to 2.5 x 0.5 x 1.1 cm. Similar-appearing lymph nodes are present in the left groin. No significant cortical thickening. No abnormal vascularity is identified. IMPRESSION: Lymph nodes in the inguinal regions bilaterally with no significant abnormal morphology. Electronically Signed   By: Thornell Sartorius M.D.   On: 01/31/2022 02:16    Labs:  CBC: Recent Labs    11/29/21 1514 11/29/21 1610 01/22/22 1450 02/20/22 0640   WBC WILL FOLLOW 4.6 3.6* 3.2*  HGB WILL FOLLOW 13.3 11.6* 11.4*  HCT WILL FOLLOW 39.7 35.8* 36.1  PLT WILL FOLLOW 127* 169 176    COAGS: Recent Labs    02/20/22 0640  INR 0.9    BMP: Recent Labs    11/29/21 1514 11/29/21 1610 01/22/22 1450  NA 136 138 136  K 3.9 4.1 4.1  CL 100 101 102  CO2 22 23 25   GLUCOSE 86 84 83  BUN 7 8 10   CALCIUM 10.5* 10.2 9.7  CREATININE 0.92 0.88 0.80  GFRNONAA  --   --  >60  LIVER FUNCTION TESTS: Recent Labs    11/29/21 1514 11/29/21 1610 01/22/22 1450  BILITOT 0.4 0.4 0.7  AST 19 18 21   ALT 11 11 12   ALKPHOS 53 50 43  PROT 8.1 7.5 8.2*  ALBUMIN 5.3* 5.1* 4.5    TUMOR MARKERS: No results for input(s): AFPTM, CEA, CA199, CHROMGRNA in the last 8760 hours.  Assessment and Plan:  Bilat lower extremity edema Lasix has reduced swelling but remains minimally per pt Inguinal lymphadenopathy per imaging Scheduled for Rt inguinal lymph node biopsy Risks and benefits of right inguinal lymph node biopsy was discussed with the patient and/or patient's family including, but not limited to bleeding, infection, damage to adjacent structures or low yield requiring additional tests.  All of the questions were answered and there is agreement to proceed. Consent signed and in chart.   Thank you for this interesting consult.  I greatly enjoyed meeting Barbara Rice and look forward to participating in their care.  A copy of this report was sent to the requesting provider on this date.  Electronically Signed: , PA-C 02/20/2022, 7:04 AM   I spent a total of  30 Minutes   in face to face in clinical consultation, greater than 50% of which was counseling/coordinating care for right inguinal lymph node bx

## 2022-02-23 LAB — CYTOLOGY - NON PAP

## 2022-02-24 ENCOUNTER — Telehealth: Payer: Self-pay | Admitting: Physician Assistant

## 2022-02-24 NOTE — Telephone Encounter (Signed)
I called [name] to review the pathology results from the right inguinal node biopsy. Findings revealed no evidence of malignancy. I recommend for patient to follow up with her PCP and monitor for any worsening lymphadenopathy. We are happy to see the patient if there is enlargement of her lymph nodes or there are new areas of concern. Patient expressed understanding of the plan provided.

## 2022-03-25 ENCOUNTER — Ambulatory Visit (INDEPENDENT_AMBULATORY_CARE_PROVIDER_SITE_OTHER): Payer: 59 | Admitting: Nurse Practitioner

## 2022-03-25 ENCOUNTER — Other Ambulatory Visit: Payer: Self-pay

## 2022-03-25 VITALS — BP 122/83 | HR 83 | Temp 98.1°F | Ht 65.0 in | Wt 170.0 lb

## 2022-03-25 DIAGNOSIS — R590 Localized enlarged lymph nodes: Secondary | ICD-10-CM | POA: Diagnosis not present

## 2022-03-25 DIAGNOSIS — R87611 Atypical squamous cells cannot exclude high grade squamous intraepithelial lesion on cytologic smear of cervix (ASC-H): Secondary | ICD-10-CM | POA: Diagnosis not present

## 2022-03-25 DIAGNOSIS — M7989 Other specified soft tissue disorders: Secondary | ICD-10-CM | POA: Diagnosis not present

## 2022-03-25 DIAGNOSIS — I890 Lymphedema, not elsewhere classified: Secondary | ICD-10-CM | POA: Diagnosis not present

## 2022-03-25 NOTE — Progress Notes (Signed)
? ?[facility] ?[place], [place]  [address] ?Phone:  [phone]   Fax:  [phone] ?Subjective:  ? Patient ID: Barbara Rice, female    DOB: 1996/11/19, 26 y.o.   MRN: [id number] ? ?Chief Complaint  ?Patient presents with  ? OFFICE VISIT  ?  Pt stated she has leg pain in both legs ,right leg is more painful and swollen than the left. Pt is requesting a refill for flexeril   ? ?HPI ?Barbara Rice 26 y.o. female  has a past medical history of Sciatic nerve pain. To the [facility] for leg swelling and pain. ? ?States that pain and swelling in BLE has recently worsened. After working yesterday, symptoms wee very pronounced. Has been taking ibuprofen with only temporary relief in symptoms. States that when pain occurs, it is 10/20 and describes as aching. Currently rates pain 7-8/10. States that swelling is the most pronounced in bilateral ankles. Indicates that flexeril does not offer any relief in symptoms. States that symptoms worsened after completion of lymph node biopsy. Has upcoming appointment with ob/gyn. Has been using TED hose with no improvement. Denies any other concerns today. ? ?Denies any fever. Denies any fatigue, chest pain, shortness of breath, HA or dizziness. Denies any blurred vision, numbness or tingling. ? ?Past Medical History:  ?Diagnosis Date  ? Sciatic nerve pain   ? ? ?No past surgical history on file. ? ?Family History  ?Problem Relation Age of Onset  ? Prostatitis Father   ? Hypertension Father   ? Cervical cancer Paternal Aunt   ? Ovarian cancer Paternal Aunt   ? Cancer Cousin   ? ? ?Social History  ? ?Socioeconomic History  ? Marital status: Married  ?  Spouse name: Not on file  ? Number of children: Not on file  ? Years of education: Not on file  ? Highest education level: Not on file  ?Occupational History  ? Not on file  ?Tobacco Use  ? Smoking status: Never  ? Smokeless tobacco: Never  ?Vaping Use  ? Vaping Use: Never used  ?Substance and  Sexual Activity  ? Alcohol use: Never  ? Drug use: Never  ? Sexual activity: Not Currently  ?Other Topics Concern  ? Not on file  ?Social History Narrative  ? Not on file  ? ?Social Determinants of Health  ? ?Financial Resource Strain: Not on file  ?Food Insecurity: Not on file  ?Transportation Needs: Not on file  ?Physical Activity: Not on file  ?Stress: Not on file  ?Social Connections: Not on file  ?Intimate Partner Violence: Not on file  ? ? ?Outpatient Medications Prior to Visit  ?Medication Sig Dispense Refill  ? amLODipine (NORVASC) 10 MG tablet Take 1 tablet (10 mg total) by mouth daily. 30 tablet 2  ? cyclobenzaprine (FLEXERIL) 5 MG tablet Take 1 tablet (5 mg total) by mouth 3 (three) times daily as needed for muscle spasms. 20 tablet 0  ? docusate sodium (COLACE) 100 MG capsule Take 1 capsule (100 mg total) by mouth 2 (two) times daily. 10 capsule 0  ? furosemide (LASIX) 40 MG tablet Take 1 tablet (40 mg total) by mouth daily. 30 tablet 3  ? ibuprofen (ADVIL) 600 MG tablet Take 1 tablet (600 mg total) by mouth every 6 (six) hours as needed. (Patient taking differently: Take 400 mg by mouth every 6 (six) hours as needed. OTC) 30 tablet 0  ? SODIUM FLUORIDE, DENTAL RINSE, 0.2 % SOLN Take by  mouth as directed.    ? acetaminophen-codeine (TYLENOL #3) 300-30 MG tablet Take 1 tablet by mouth every 4 (four) hours as needed for moderate pain. (Patient not taking: Reported on 01/14/2022) 30 tablet 0  ? ?No facility-administered medications prior to visit.  ? ? ?No Known Allergies ? ?Review of Systems  ?Constitutional:  Negative for chills, fever and malaise/fatigue.  ?Respiratory:  Negative for cough and shortness of breath.   ?Cardiovascular:  Positive for leg swelling. Negative for chest pain and palpitations.  ?Gastrointestinal:  Negative for abdominal pain, blood in stool, constipation, diarrhea, nausea and vomiting.  ?Musculoskeletal:  Negative for back pain, falls, joint pain, myalgias and neck pain.  ?      Leg pain   ?Skin: Negative.   ?Neurological: Negative.   ?Psychiatric/Behavioral:  Negative for depression. The patient is not nervous/anxious.   ?All other systems reviewed and are negative. ? ?   ?Objective:  ?  ?Physical Exam ?Vitals reviewed.  ?Constitutional:   ?   General: She is not in acute distress. ?   Appearance: Normal appearance. She is normal weight.  ?HENT:  ?   Head: Normocephalic.  ?Cardiovascular:  ?   Rate and Rhythm: Normal rate and regular rhythm.  ?   Pulses: Normal pulses.  ?   Heart sounds: Normal heart sounds.  ?   Comments: No obvious peripheral edema ?Pulmonary:  ?   Effort: Pulmonary effort is normal.  ?   Breath sounds: Normal breath sounds.  ?Musculoskeletal:     ?   General: No swelling, tenderness, deformity or signs of injury. Normal range of motion.  ?   Right lower leg: No edema.  ?   Left lower leg: No edema.  ?Skin: ?   General: Skin is warm and dry.  ?   Capillary Refill: Capillary refill takes less than 2 seconds.  ?Neurological:  ?   General: No focal deficit present.  ?   Mental Status: She is alert and oriented to person, place, and time.  ?Psychiatric:     ?   Mood and Affect: Mood normal.     ?   Behavior: Behavior normal.     ?   Thought Content: Thought content normal.     ?   Judgment: Judgment normal.  ? ? ?BP 122/83 (BP Location: Right Arm, Patient Position: Sitting, Cuff Size: Normal)   Pulse 83   Temp 98.1 ?F (36.7 ?C)   Ht _0  (1.651 m)   Wt 170 lb 0.2 oz (77.1 kg)   SpO2 100%   BMI 28.29 kg/m?  ?Wt Readings from Last 3 Encounters:  ?03/25/22 170 lb 0.2 oz (77.1 kg)  ?02/20/22 170 lb (77.1 kg)  ?01/22/22 170 lb (77.1 kg)  ? ? ?Immunization History  ?Administered Date(s) Administered  ? Influenza-Unspecified 10/09/2021  ? ? ?Diabetic Foot Exam - Simple   ?No data filed ?  ? ? ?Rice Results  ?Component Value Date  ? TSH 0.466 11/29/2021  ? ?Rice Results  ?Component Value Date  ? WBC 3.2 (L) 02/20/2022  ? HGB 11.4 (L) 02/20/2022  ? HCT 36.1 02/20/2022  ? MCV  89.6 02/20/2022  ? PLT 176 02/20/2022  ? ?Rice Results  ?Component Value Date  ? NA 136 01/22/2022  ? K 4.1 01/22/2022  ? CO2 25 01/22/2022  ? GLUCOSE 83 01/22/2022  ? BUN 10 01/22/2022  ? CREATININE 0.80 01/22/2022  ? BILITOT 0.7 01/22/2022  ? ALKPHOS 43 01/22/2022  ? AST 21  01/22/2022  ? ALT 12 01/22/2022  ? PROT 8.2 (H) 01/22/2022  ? ALBUMIN 4.5 01/22/2022  ? CALCIUM 9.7 01/22/2022  ? ANIONGAP 9 01/22/2022  ? EGFR 92 11/29/2021  ? ?Rice Results  ?Component Value Date  ? CHOL 227 (H) 11/29/2021  ? CHOL 231 (H) 11/29/2021  ? ?Rice Results  ?Component Value Date  ? HDL 78 11/29/2021  ? HDL 81 11/29/2021  ? ?Rice Results  ?Component Value Date  ? LDLCALC 133 (H) 11/29/2021  ? LDLCALC 133 (H) 11/29/2021  ? ?Rice Results  ?Component Value Date  ? TRIG 92 11/29/2021  ? TRIG 96 11/29/2021  ? ?Rice Results  ?Component Value Date  ? CHOLHDL 2.9 11/29/2021  ? CHOLHDL 2.9 11/29/2021  ? ?Rice Results  ?Component Value Date  ? HGBA1C 5.6 11/29/2021  ? HGBA1C WILL FOLLOW 11/29/2021  ? ? ?   ?Assessment & Plan:  ? ?Problem List Items Addressed This Visit   ? ?  ? Immune and Lymphatic  ? Inguinal lymphadenopathy  ? ?Other Visit Diagnoses   ? ? Leg swelling    -  Primary  ? Relevant Orders  ? Ambulatory referral to Obstetrics / Gynecology ?Discussed non pharmacological methods for management of symptoms ?Informed to take OTC medications as needed ?  ? Atypical squamous cells cannot exclude high grade squamous intraepithelial lesion on cytologic smear of cervix (ASC-H)      ? Relevant Orders  ? Ambulatory referral to Obstetrics / Gynecology  ? Lymphedema      ? Relevant Orders  ? Ambulatory referral to Physical Therapy ?Discussed non pharmacological methods for management of symptoms ?Informed to take OTC medications as needed ?  ? ?Maintain upcoming follow up with PCP, sooner as needed   ? ? ?I am having Barbara Rice maintain her acetaminophen-codeine, ibuprofen, cyclobenzaprine, SODIUM FLUORIDE (DENTAL RINSE), docusate sodium,  furosemide, and amLODipine. ? ?No orders of the defined types were placed in this encounter. ? ? ? ?Teena Dunk, NP ?  ?

## 2022-03-26 ENCOUNTER — Encounter: Payer: Self-pay | Admitting: Nurse Practitioner

## 2022-03-31 ENCOUNTER — Encounter: Payer: Self-pay | Admitting: Occupational Therapy

## 2022-03-31 ENCOUNTER — Ambulatory Visit: Payer: 59 | Attending: Nurse Practitioner | Admitting: Occupational Therapy

## 2022-03-31 ENCOUNTER — Telehealth: Payer: Self-pay | Admitting: Nurse Practitioner

## 2022-03-31 DIAGNOSIS — I890 Lymphedema, not elsewhere classified: Secondary | ICD-10-CM | POA: Insufficient documentation

## 2022-03-31 NOTE — Telephone Encounter (Signed)
Patient left VM requesting a call from either the provider or nurse regarding FMLA.  ?

## 2022-04-01 ENCOUNTER — Encounter: Payer: Self-pay | Admitting: Occupational Therapy

## 2022-04-01 NOTE — Therapy (Signed)
[facility] ?[place], [place], [address] ?Phone: [phone]   Fax:  [phone] ? ?Occupational Therapy Evaluation ? ?Patient Details  ?Name: Barbara Rice ?MRN: [id number] ?Date of Birth: December 01, 1996 ?Referring Provider (OT): Bo Merino, NP ? ? ?Encounter Date: 03/31/2022 ? ? OT End of Session - 03/31/22 1452   ? ? Visit Number 1   ? Number of Visits 36   ? Date for OT Re-Evaluation 06/29/22   ? Progress Note Due on Visit 10   ? OT Start Time [phone]   ? OT Stop Time 0915   ? OT Time Calculation (min) 72 min   ? Activity Tolerance Patient tolerated treatment well;No increased pain   ? Behavior During Therapy [facility] for tasks assessed/performed   ? ?  ?  ? ?  ? ? ?Past Medical History:  ?Diagnosis Date  ? Sciatic nerve pain   ? ? ?History reviewed. No pertinent surgical history. ? ?There were no vitals filed for this visit. ? ? Subjective Assessment - 04/01/22 0814   ? ? Subjective  Barbara Rice is referred to Occupational Therapy for evaluation and treatment of BLE lymphedema by Bo Merino, NP. "Barbara Rice" reports sudden onset of bilateral leg swelling in November 2022 without known precipitating event. 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 She was at work when she noticed her legs felt tight and uncomfortable. She was alarmed when she observed they were very swollen. She denies kjnown family history of limb swelling. She has not undergone lymphedema treatment in the past. Currently she wears TEDS stockings at work. Ultrasound ruled out DVT and Podiatry was consulted without follow up. Pt's goal for OT for lymphedema care is, "My provider and I are trying to find out the cause of my swollen lymph nodes on both sides.  I hope this helps with pain management".   ? Pertinent History Insideous onset BLE swelling with associated pain 10/2021; denies known family hx of limb swelling;  HTN (amlodipine!); hx sciatic nerve pain; S/p 02/20/22 US guided FNA of right inguinal lymph node w/ Inguinal lymphadenopathy per imaging; Subsequent cytology ruled out malignancy   ? Limitations Decreased standing and walking tolerance 2/2 chronnic bilateral leg swelling and associated pain; hgait disturbance,   ? Repetition Increases Symptoms   ? Special Tests - Stemmer bilaterally. Intake FOTO score: 83/100   ? Patient Stated Goals My provider and I are trying to find out the cause of my swollen lymph nodes on both sides.  I hope this helps with pain management.   ? Currently in Pain? Yes   ? Pain Score 3    ? Pain Location Leg   ? Pain Descriptors / Indicators Aching;Burning;Heaviness;Restless;Tiring;Jabbing;Cramping;Discomfort;Pressure;Tender;Throbbing;Tightness   ? Pain Type Chronic pain   ? Pain Onset --   leg swelling and associated pain  onet 11/22  ? Aggravating Factors  standing, walking, dependent sitting   ? Pain Relieving Factors elevation, rubbing   ? Effect of Pain on Daily Activities Leg swelling and pain limits ability to perform duties at work as a Chartered certified accountant, limits social participation when activities include standing and walking; limits B/IADLs ( fitting LB clothing and footwear, limits body image   ? Multiple Pain Sites No   ? ?  ?  ? ?  ? ? ? ? [name] - 03/31/22 0832   ? ?  ? Assessment  ? 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 Referring Provider (OT) Bo Merino, NP   ? Onset Date/Surgical Date 11/17/21   ? Hand Dominance Right   ? Prior Therapy no CDT; TEDS   ?  ? Precautions  ? Precautions None   ?  ? Restrictions  ? Weight Bearing Restrictions No   ?  ? Balance Screen  ? Has the patient fallen in the past 6 months No   ? Has the patient had a decrease in activity level because of a fear of falling?  Yes   ?  ? Prior Function  ? Level of Independence Independent with basic  ADLs;Independent;Independent with household mobility without device;Independent with community mobility without device;Independent with homemaking with ambulation;Independent with transfers   ? Vocation Full time employment   ? 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 Leisure friends   ?  ? IADL  ? Prior Level of Function Shopping I   ? Shopping Takes care of all shopping needs independently   requires seated rest breaks to elevate legs  ? Prior Level of Function Light Housekeeping I   ? Light Housekeeping Does personal laundry completely   requires seated rest breaks to elevate legs  ? Prior Level of Function Meal Prep I   ? Meal Prep Plans, prepares and serves adequate meals independently   requires seated rest breaks to elevate legs  ? Prior Level of Function Community Mobility I   ? Community Mobility Drives own vehicle   ?  ? Mobility  ? Mobility Status Independent   ?  ? Activity Tolerance  ? Activity Tolerance Endurance does not limit participation in activity   ?  ? Cognition  ? Overall Cognitive Status Within Functional Limits for tasks assessed   ?  ? Observation/Other Assessments  ? Observations Mild, fluctuating, non -pitting lymphedema. Skin is well hydrated and pliable, with mildly "rubbery" texture at distal legs and ankles. Tissue below the knees is spony   ?  ? Posture/Postural Control  ? Posture/Postural Control No significant limitations   ?  ? Sensation  ? Light Touch Appears Intact   ?  ? Coordination  ? Gross Motor Movements are Fluid and Coordinated Yes   ? Fine Motor Movements are Fluid and Coordinated Yes   ?  ? ROM / Strength  ? AROM / PROM / Strength AROM;Strength   ?  ? AROM  ? Overall AROM  Within functional limits for tasks performed   ? AROM Assessment Site Hip;Knee;Ankle   ?  ? Strength  ? Overall Strength Within functional limits for tasks performed    ? ?  ?  ? ?  ? ? ? ? ? ? ? ? ? ? ? OT Treatments/Exercises (OP) - 04/01/22 [id number]   ? ?  ? Transfers  ? Transfers Sit to Stand;Stand to Sit   ? Sit to Stand 7: Independent   ? Stand to Sit 7: Independent   ?  ? ADLs  ? Overall ADLs Modified independent with basic and instrumental ADLs requiring standing/ walking > 30 minutes with seated rest breaks 2/2 leg pain and swelling   ? LB Dressing difficulty fitting LB clothing and shoes due to leg and foot swelling   ? Work leg/foot swelling and associated pain limits ability to perform job duties as 12 hour shifts  require oongoing standing and walking   ? ADL Education Given Yes   ?  ? Manual Therapy  ? Manual Therapy Edema management   ? ?  ?  ? ?  ? ? ? ? ? ? ? ? ? OT Education - 03/31/22 0834   ? ? Education Details Provided Pt education regarding lymphatic structure and function, etiology, onset patterns and stages of progression. Taught interaction between blood circulatory system and lymphatic circulation.Discussed  impact of gravity and co-morbidities on lymphatic function. Outlined Complete Decongestive Therapy (CDT)  as standard of care and provided in depth information regarding 4 primary components of Intensive and Self Management Phases, including Manual Lymph Drainage (MLD), compression wrapping and garments, skin care, and therapeutic exercise. Pilar Plate discussion with re need for frequent attendance and high burden of care when caregiver is needed, impact of co morbidities. We discussed  the chronic, progressive nature of lymphedema and Importance of daily, ongoing LE self-care essential for limiting progression and infection risk.   ? Person(s) Educated Patient   ? Methods Explanation;Demonstration;Handout   ? Comprehension Verbalized understanding;Returned demonstration;Need further instruction   ? ?  ?  ? ?  ? ? ? ? ? ? OT Long Term Goals - 04/01/22 1244   ? ?  ? OT LONG TERM GOAL #1  ? Title Given this patient?s Intake score of 83/100 on the functional  outcomes FOTO tool, patient will experience an increase in function of 6 points to 89, to improve basic and instrumental ADLs performance, including lymphedema self-care.   ? Baseline 83/100   ? Time 12   ? Period Weeks   ? Status N

## 2022-04-07 ENCOUNTER — Ambulatory Visit: Payer: 59 | Admitting: Occupational Therapy

## 2022-04-07 NOTE — Progress Notes (Deleted)
? ? ?  GYNECOLOGY OFFICE COLPOSCOPY PROCEDURE NOTE ? ?26 y.o. here for colposcopy for ASC cannot exclude high grade lesion [facility]) pap smear on 01/14/2022.  ? ?Pregnancy test:  {Blank single:19197::"positive","negative","not indicated"} ?Gardasil:  {Blank single:19197::"completed","not yet had, declines","not yet had, accepts"}. Discussed role for HPV in cervical dysplasia, need for surveillance. ? ?Informed consent and review of risks, benefit and alternatives performed. Written consent given.  ? ?Speculum inserted into patient's vagina assuring full view of cervix and vaginal walls. 3 swabs of vinegar solution applied to the cervix and vaginal walls and colposcope was used to observe both the cervix and vaginal walls.  ? ?Colposcopy adequate? {yes/no:20286} ? {Findings; colposcopy:728}; corresponding biopsies obtained.  ECC collected. ? ?All specimens were labeled and sent to pathology. ? ?Monsel's applied to biopsy sites for good hemostasis and speculum removed.  ?Pt tolerated well with minimal pain and bleeding.  ? ?Patient was given post procedure instructions.  Will follow up pathology and manage accordingly; patient will be contacted with results and recommendations.  Routine preventative health maintenance measures emphasized. ? ? ? ?Milas Hock, MD, FACOG ?Obstetrician Heritage manager, Faculty Practice ?Center for [employer], [facility] ? ? ? ? ? ? ?

## 2022-04-08 ENCOUNTER — Ambulatory Visit: Payer: 59 | Admitting: Obstetrics and Gynecology

## 2022-04-08 DIAGNOSIS — R87619 Unspecified abnormal cytological findings in specimens from cervix uteri: Secondary | ICD-10-CM

## 2022-04-10 ENCOUNTER — Ambulatory Visit: Payer: 59 | Admitting: Occupational Therapy

## 2022-04-10 DIAGNOSIS — I890 Lymphedema, not elsewhere classified: Secondary | ICD-10-CM

## 2022-04-10 NOTE — Therapy (Signed)
[place] ?[facility] ?[address] ?[place], [place], [address] ?Phone: [phone]   Fax:  [phone] ? ?Occupational Therapy Treatment ? ?Patient Details  ?Name: Barbara Rice ?MRN: [id number] ?Date of Birth: [birth year] ?Referring Provider (OT): Orion Crook, NP ? ? ?Encounter Date: 04/10/2022 ? ? OT End of Session - 04/10/22 1028   ? ? Visit Number 2   ? Number of Visits 36   ? Date for OT Re-Evaluation 06/29/22   ? Progress Note Due on Visit 10   ? OT Start Time 1015   ? OT Stop Time 1100   ? OT Time Calculation (min) 45 min   ? Activity Tolerance Patient tolerated treatment well;No increased pain   ? Behavior During Therapy [facility] for tasks assessed/performed   ? ?  ?  ? ?  ? ? ?Past Medical History:  ?Diagnosis Date  ? Sciatic nerve pain   ? ? ?No past surgical history on file. ? ?There were no vitals filed for this visit. ? ? Subjective Assessment - 04/10/22 1303   ? ? Subjective  "Barbara Rice" presents for OT Rx visit 2/36 to address BLE lymphedema and associated pain. Pt rates R leg pain at 5/10 this morning. Pt has no new complaints since initial evaluation.   ? Pertinent History Insideous onset BLE swelling with associated pain 10/2021; denies known family hx of limb swelling; HTN (amlodipine!); hx sciatic nerve pain; S/p 02/20/22 US guided FNA of right inguinal lymph node w/ Inguinal lymphadenopathy per imaging; Subsequent cytology ruled out malignancy   ? Limitations Decreased standing and walking tolerance 2/2 chronnic bilateral leg swelling and associated pain; hgait disturbance,   ? Repetition Increases Symptoms   ? Special Tests - Stemmer bilaterally. Intake FOTO score: 83/100   ? Patient Stated Goals My provider and I are trying to find out the cause of my swollen lymph nodes on both sides.  I hope this helps with pain management.   ? Currently in Pain? Yes   ? Pain Score 5    ? Pain Location Leg   ? Pain Orientation Right   ? Pain Descriptors /  Indicators Aching   ? Pain Type Chronic pain   ? Pain Onset --   leg swelling and associated pain  onet 11/22  ? ?  ?  ? ?  ? ? ? ? ? ? LYMPHEDEMA/ONCOLOGY QUESTIONNAIRE - 04/10/22 1306   ? ?  ? Lymphedema Assessments  ? Lymphedema Assessments Lower extremities   ?  ? Right Lower Extremity Lymphedema  ? Other RLE leg (A-D) volume = 3351.1 ml.RLE thigh (E-G) volume= 7790.7 ml. RLE anle to groin (A-G) volume =11142.4 ml.   ? Other Limb volume differential (LVD) for A-D= 3.4%, R>L. LVD for E-G= 1.6%, L>R. LVD for A-G= .06%, R>L.   ? Other R dominant   ?  ? Left Lower Extremity Lymphedema  ? Other LLE leg (A-D) volume = 3237.0 ml. LLE thigh (E-G) volume= 7912.5   ml. LLE anle to groin (A-G) volume = 11149.5 ml.   ? ?  ?  ? ?  ? ? ? ? ? ? ? ? ? ? OT Treatments/Exercises (OP) - 04/10/22 1305   ? ?  ? ADLs  ? ADL Education Given Yes   ?  ? Manual Therapy  ? Manual Therapy Edema management   ? Edema Management initial BLE comparative limb volumetrics   ? ?  ?  ? ?  ? ? ? ? ? ? ? ? ?  OT Education - 04/10/22 1311   ? ? Education Details Emphasis of LE self-care training today on Pt and family edu for initial volumetrics comparison and on gradient compression wrap application at home between sessions.   ? Person(s) Educated Patient   ? Methods Explanation;Demonstration;Handout   ? Comprehension Verbalized understanding;Returned demonstration;Need further instruction   ? ?  ?  ? ?  ? ? ? ? ? ? OT Long Term Goals - 04/01/22 1244   ? ?  ? OT LONG TERM GOAL #1  ? Title Given this patient?s Intake score of 83/100 on the functional outcomes FOTO tool, patient will experience an increase in function of 6 points to 89, to improve basic and instrumental ADLs performance, including lymphedema self-care.   ? Baseline 83/100   ? Time 12   ? Period Weeks   ? Status New   ? Target Date 06/29/22   ?  ? OT LONG TERM GOAL #2  ? Title Pt will achieve at least a 10% limb volume reduction from ankle to groin bilaterally to return limb to more  normal size and shape, to limit infection risk, to decrease pain, to improve function, and to limit lymphedema progression.   ? Baseline dependent   ? Time 12   ? Period Days   ? Status New   ? Target Date 06/29/22   ?  ? OT LONG TERM GOAL #3  ? Title Pt will achieve and sustain a least 85% compliance with all LE self-care home program components throughout OT for CDT, including daily skin inspection and care, lymphatic pumping ther ex, 23/7 compression wraps and simple self-MLD, to sustain clinical gains made in CDT and to limit lymphedema progression and further functional decline.   ? Baseline Max A   ? Time 12   ? Period Weeks   ? Status New   ? Target Date 06/29/22   ?  ? OT LONG TERM GOAL #4  ? Title Pt will be able to donn/doff appropriate compression garments and devices and follow recommended wear and care schedule by DC for optimal LE self-management over time.   ? Baseline Max A   ? Time 12   ? Period Weeks   ? Status New   ? Target Date 06/29/22   ?  ? OT LONG TERM GOAL #5  ? Title Pt will demonstrate understanding of lymphedema prevention strategies by identifying and discussing 5 precautions using printed reference (modified assistance) to reduce risk of progression and to limit infection risk.   ? Baseline Max A   ? Time 4   ? Period Days   ? Status New   ? Target Date --   4th OT Rx visit  ?  ? Long Term Additional Goals  ? Additional Long Term Goals Yes   ? ?  ?  ? ?  ? ? ? ? ? ? ? ? Plan - 04/10/22 1256   ? ? Clinical Impression Statement Initial BLE limb volumetrics reveal near symmetry between R (dominant) and L limb volumes. Limb volumetricxs reveal a 3.4% , R (dom) > L limb volume differential (LVD) for the leg (A-D) , a 1.6% LVD for the thigh (E-D) with L>R, and LVD for full lower extremities (A-G)  of .06 %, R (Dom) > L. Pt is in agreement with plan to complete garment measurements next session and commence MLD. We'll start CDT using OTS knee highs. We have a trial scheduled with the advanced  sequential pneumatic  device , the Flexitouch, next week on 4/20.   ? OT Occupational Profile and History Detailed Assessment- Review of Records and additional review of physical, cognitive, psychosocial history related to current functional performance   ? Occupational performance deficits (Please refer to evaluation for details): ADL's;IADL's;Rest and Sleep;Work;Leisure;Social Participation;Other   body image; functional ambulation  and  mobility  ? Rehab Potential Good   ? Clinical Decision Making Limited treatment options, no task modification necessary   ? Comorbidities impacting occupational performance description: HTN   ? Modification or Assistance to Complete Evaluation  No modification of tasks or assist necessary to complete eval   ? OT Frequency 1x / week   1 x weekly for 6 weeks tinitially. We'll focus on fitting off-the-shelf compression thigh highs initially- considering ccl 2 (30-40 mmHg)  or 3 (40-50 mmHg) for 12 hour shifts on feet, and providing MLD.  ? OT Duration 6 weeks   ? OT Treatment/Interventions Self-care/ADL training;DME and/or AE instruction;Manual lymph drainage;Compression bandaging;Therapeutic activities;Coping strategies training;Therapeutic exercise;Other (comment);Manual Therapy;Energy conservation;Patient/family education   ? Plan Modified Intensive Phase CDT: 1 x weekly for 6 weeks tinitially. We'll focus on fitting off-the-shelf compression thigh highs , especially for working hours!- considering ccl 2 (30-40 mmHg)  or 3 (40-50 mmHg) . Will teach all LE self care skills, including simple self-MLD, skin care, ther ex and compression options based on activity demands. If no volumetric change will commence multilayer gradient compression wrapping to LLE  and complete standard Intensive phase CDT.   ? Recommended Other Services Consider changing AmLODapine for alternative med since Norvasc has leg and ankle swelling as common side effect   ? Consulted and Agree with Plan of Care  Patient   ? ?  ?  ? ?  ? ? ?Patient will benefit from skilled therapeutic intervention in order to improve the following deficits and impairments:   ?  ?  ?  ? ? ?Visit Diagnosis: ?Lymphedema, not elsewhere classifi

## 2022-04-10 NOTE — Patient Instructions (Signed)

## 2022-04-14 ENCOUNTER — Ambulatory Visit: Payer: 59 | Admitting: Nurse Practitioner

## 2022-04-14 ENCOUNTER — Ambulatory Visit: Payer: 59 | Admitting: Occupational Therapy

## 2022-04-15 ENCOUNTER — Encounter: Payer: Self-pay | Admitting: Obstetrics

## 2022-04-15 ENCOUNTER — Ambulatory Visit: Payer: 59 | Admitting: Obstetrics

## 2022-04-15 ENCOUNTER — Other Ambulatory Visit (HOSPITAL_COMMUNITY)
Admission: RE | Admit: 2022-04-15 | Discharge: 2022-04-15 | Disposition: A | Discharge status: Home / Self Care | Payer: 59 | Source: Ambulatory Visit | Attending: Obstetrics and Gynecology | Admitting: Obstetrics and Gynecology

## 2022-04-15 VITALS — BP 126/83 | HR 88 | Wt 166.8 lb

## 2022-04-15 DIAGNOSIS — Z01812 Encounter for preprocedural laboratory examination: Secondary | ICD-10-CM

## 2022-04-15 DIAGNOSIS — R87611 Atypical squamous cells cannot exclude high grade squamous intraepithelial lesion on cytologic smear of cervix (ASC-H): Secondary | ICD-10-CM | POA: Insufficient documentation

## 2022-04-15 LAB — POCT URINE PREGNANCY: Preg Test, Ur: NEGATIVE

## 2022-04-15 NOTE — Progress Notes (Signed)
Colposcopy Procedure Note ? ?Indications: Pap smear 3 months ago showed: ASC cannot exclude high grade lesion [facility]). The prior pap showed no abnormalities.  Prior cervical/vaginal disease: normal exam without visible pathology. Prior cervical treatment: no treatment. ? ?Procedure Details  ?The risks and benefits of the procedure and Written informed consent obtained.  A time-out was performed confirming the patient, procedure and allergy status ? ?Speculum placed in vagina and excellent visualization of cervix achieved, cervix swabbed x 3 with acetic acid solution. ? ?Findings: ?Cervix: no mosaicism, no punctation, no abnormal vasculature, and acetowhite lesion(s) noted at 6 o'clock; SCJ visualized 360 degrees without lesions, endocervical curettage performed, cervical biopsies taken at 6, 9 and 12 o'clock, specimen labelled and sent to pathology, and hemostasis achieved with Monsel's solution.   ?Vaginal inspection: normal without visible lesions. ?Vulvar colposcopy: vulvar colposcopy not performed. ?  ?Physical Exam ?  ?Specimens: ECC and Cervical Biopsies ? ?Complications: none. ? ?Plan: ?Specimens labelled and sent to Pathology. ?Will base further treatment on Pathology findings. ?Treatment options discussed with patient. ?Post biopsy instructions given to patient. ?Return to discuss Pathology results in 2 weeks.  ? ?Brock Bad, MD ?04/15/2022 4:19 PM  ?

## 2022-04-15 NOTE — Progress Notes (Signed)
Pt in office for colposcopy after abnormal PAP on 01-14-22. Pt does not have any concerns today.  ?

## 2022-04-17 ENCOUNTER — Ambulatory Visit: Payer: 59 | Admitting: Occupational Therapy

## 2022-04-17 LAB — SURGICAL PATHOLOGY

## 2022-04-21 ENCOUNTER — Encounter: Payer: Self-pay | Admitting: Nurse Practitioner

## 2022-04-21 ENCOUNTER — Ambulatory Visit (INDEPENDENT_AMBULATORY_CARE_PROVIDER_SITE_OTHER): Payer: 59 | Admitting: Nurse Practitioner

## 2022-04-21 VITALS — BP 136/82 | HR 78 | Temp 98.4°F | Ht 65.0 in | Wt 171.2 lb

## 2022-04-21 DIAGNOSIS — M7989 Other specified soft tissue disorders: Secondary | ICD-10-CM

## 2022-04-21 DIAGNOSIS — M62838 Other muscle spasm: Secondary | ICD-10-CM | POA: Diagnosis not present

## 2022-04-21 DIAGNOSIS — R87611 Atypical squamous cells cannot exclude high grade squamous intraepithelial lesion on cytologic smear of cervix (ASC-H): Secondary | ICD-10-CM | POA: Diagnosis not present

## 2022-04-21 MED ORDER — CYCLOBENZAPRINE HCL 5 MG PO TABS: 5.0000 mg | ORAL_TABLET | Freq: Three times a day (TID) | ORAL | 0 refills | Status: DC | PRN

## 2022-04-21 NOTE — Progress Notes (Signed)
? ?[facility] ?[place], [place]  [address] ?Phone:  [phone]   Fax:  [phone] ?Subjective:  ? Patient ID: Barbara Rice, female    DOB: [birth year], 26 y.o.   MRN: [id number] ? ?Chief Complaint  ?Patient presents with  ? Follow-up  ?  Pt stated still has pain and swelling  ? ?HPI ?Barbara Rice 26 y.o. female with history of bilateral leg swelling and pain. Patient states that symptoms have somewhat improved with massages. ? ?States that she completed procedure for colposcopy and has follow up next week for additional procedure and management. Indicates that the FMLA has been helping with having rest periods and managing symptoms.  ? ?Denies any other concerns today. Denies any fatigue, chest pain, shortness of breath, HA or dizziness. Denies any blurred vision, numbness or tingling.  ? ?Past Medical History:  ?Diagnosis Date  ? Sciatic nerve pain   ? ? ?History reviewed. No pertinent surgical history. ? ?Family History  ?Problem Relation Age of Onset  ? Prostatitis Father   ? Hypertension Father   ? Cervical cancer Paternal Aunt   ? Ovarian cancer Paternal Aunt   ? Cancer Cousin   ? ? ?Social History  ? ?Socioeconomic History  ? Marital status: Married  ?  Spouse name: Not on file  ? Number of children: Not on file  ? Years of education: Not on file  ? Highest education level: Not on file  ?Occupational History  ? Not on file  ?Tobacco Use  ? Smoking status: Never  ? Smokeless tobacco: Never  ?Vaping Use  ? Vaping Use: Never used  ?Substance and Sexual Activity  ? Alcohol use: Never  ? Drug use: Never  ? Sexual activity: Not Currently  ?Other Topics Concern  ? Not on file  ?Social History Narrative  ? Not on file  ? ?Social Determinants of Health  ? ?Financial Resource Strain: Not on file  ?Food Insecurity: Not on file  ?Transportation Needs: Not on file  ?Physical Activity: Not on file  ?Stress: Not on file  ?Social Connections: Not on file  ?Intimate Partner  Violence: Not on file  ? ? ?Outpatient Medications Prior to Visit  ?Medication Sig Dispense Refill  ? docusate sodium (COLACE) 100 MG capsule Take 1 capsule (100 mg total) by mouth 2 (two) times daily. 10 capsule 0  ? furosemide (LASIX) 40 MG tablet Take 1 tablet (40 mg total) by mouth daily. 30 tablet 3  ? ibuprofen (ADVIL) 600 MG tablet Take 1 tablet (600 mg total) by mouth every 6 (six) hours as needed. (Patient taking differently: Take 400 mg by mouth every 6 (six) hours as needed. OTC) 30 tablet 0  ? SODIUM FLUORIDE, DENTAL RINSE, 0.2 % SOLN Take by mouth as directed.    ? acetaminophen-codeine (TYLENOL #3) 300-30 MG tablet Take 1 tablet by mouth every 4 (four) hours as needed for moderate pain. (Patient not taking: Reported on 01/14/2022) 30 tablet 0  ? amLODipine (NORVASC) 10 MG tablet Take 1 tablet (10 mg total) by mouth daily. 30 tablet 2  ? cyclobenzaprine (FLEXERIL) 5 MG tablet Take 1 tablet (5 mg total) by mouth 3 (three) times daily as needed for muscle spasms. (Patient not taking: Reported on 04/21/2022) 20 tablet 0  ? ?No facility-administered medications prior to visit.  ? ? ?No Known Allergies ? ?Review of Systems  ?Constitutional:  Negative for chills, fever and malaise/fatigue.  ?Respiratory:  Negative for cough and shortness  of breath.   ?Cardiovascular:  Positive for leg swelling. Negative for chest pain and palpitations.  ?Gastrointestinal:  Negative for abdominal pain, blood in stool, constipation, diarrhea, nausea and vomiting.  ?Musculoskeletal: Negative.   ?Skin: Negative.   ?Neurological: Negative.   ?Psychiatric/Behavioral:  Negative for depression. The patient is not nervous/anxious.   ?All other systems reviewed and are negative. ? ?   ?Objective:  ?  ?Physical Exam ?Vitals reviewed.  ?Constitutional:   ?   General: She is not in acute distress. ?   Appearance: Normal appearance. She is normal weight.  ?HENT:  ?   Head: Normocephalic.  ?Cardiovascular:  ?   Rate and Rhythm: Normal rate  and regular rhythm.  ?   Pulses: Normal pulses.  ?   Heart sounds: Normal heart sounds.  ?   Comments: No obvious peripheral edema ?Pulmonary:  ?   Effort: Pulmonary effort is normal.  ?   Breath sounds: Normal breath sounds.  ?Musculoskeletal:     ?   General: No swelling, tenderness, deformity or signs of injury. Normal range of motion.  ?   Right lower leg: No edema.  ?   Left lower leg: No edema.  ?Skin: ?   General: Skin is warm and dry.  ?   Capillary Refill: Capillary refill takes less than 2 seconds.  ?Neurological:  ?   General: No focal deficit present.  ?   Mental Status: She is alert and oriented to person, place, and time.  ?Psychiatric:     ?   Mood and Affect: Mood normal.     ?   Behavior: Behavior normal.     ?   Thought Content: Thought content normal.     ?   Judgment: Judgment normal.  ? ? ?BP 136/82 (BP Location: Right Arm, Patient Position: Sitting, Cuff Size: Normal)   Pulse 78   Temp 98.4 ?F (36.9 ?C)   Ht 5' 5"  (1.651 m)   Wt 171 lb 4 oz (77.7 kg)   SpO2 100%   BMI 28.50 kg/m?  ?Wt Readings from Last 3 Encounters:  ?04/21/22 171 lb 4 oz (77.7 kg)  ?04/15/22 166 lb 12.8 oz (75.7 kg)  ?03/25/22 170 lb 0.2 oz (77.1 kg)  ? ? ?Immunization History  ?Administered Date(s) Administered  ? Influenza-Unspecified 10/09/2021  ? ? ?Diabetic Foot Exam - Simple   ?No data filed ?  ? ? ?Rice Results  ?Component Value Date  ? TSH 0.466 11/29/2021  ? ?Rice Results  ?Component Value Date  ? WBC 3.2 (L) 02/20/2022  ? HGB 11.4 (L) 02/20/2022  ? HCT 36.1 02/20/2022  ? MCV 89.6 02/20/2022  ? PLT 176 02/20/2022  ? ?Rice Results  ?Component Value Date  ? NA 136 01/22/2022  ? K 4.1 01/22/2022  ? CO2 25 01/22/2022  ? GLUCOSE 83 01/22/2022  ? BUN 10 01/22/2022  ? CREATININE 0.80 01/22/2022  ? BILITOT 0.7 01/22/2022  ? ALKPHOS 43 01/22/2022  ? AST 21 01/22/2022  ? ALT 12 01/22/2022  ? PROT 8.2 (H) 01/22/2022  ? ALBUMIN 4.5 01/22/2022  ? CALCIUM 9.7 01/22/2022  ? ANIONGAP 9 01/22/2022  ? EGFR 92 11/29/2021  ? ?Rice  Results  ?Component Value Date  ? CHOL 227 (H) 11/29/2021  ? CHOL 231 (H) 11/29/2021  ? ?Rice Results  ?Component Value Date  ? HDL 78 11/29/2021  ? HDL 81 11/29/2021  ? ?Rice Results  ?Component Value Date  ? LDLCALC 133 (H) 11/29/2021  ?  LDLCALC 133 (H) 11/29/2021  ? ?Rice Results  ?Component Value Date  ? TRIG 92 11/29/2021  ? TRIG 96 11/29/2021  ? ?Rice Results  ?Component Value Date  ? CHOLHDL 2.9 11/29/2021  ? CHOLHDL 2.9 11/29/2021  ? ?Rice Results  ?Component Value Date  ? HGBA1C 5.6 11/29/2021  ? HGBA1C WILL FOLLOW 11/29/2021  ? ? ?   ?Assessment & Plan:  ? ?Problem List Items Addressed This Visit   ?None ?Visit Diagnoses   ? ? Leg swelling    -  Primary  ? Relevant Medications  ? cyclobenzaprine (FLEXERIL) 5 MG tablet, refilled at patient's request ?Encouraged to continue lymphatic massage sessions to assist with swelling   ? Atypical squamous cells cannot exclude high grade squamous intraepithelial lesion on cytologic smear of cervix (ASC-H)     ?Encouraged to maintain follow up with  Ob/Gyn ?Discussed Rice results from specialist visit  ?Given anticipatory guidance   ? Muscle spasms of both lower extremities      ? Relevant Medications  ? cyclobenzaprine (FLEXERIL) 5 MG tablet  ? ?Follow up in 2 mths s/p management by Ob/Gyn for reevaluation of swelling, sooner as needed   ? ? ?I am having Birgitta Luepke maintain her acetaminophen-codeine, ibuprofen, SODIUM FLUORIDE (DENTAL RINSE), docusate sodium, furosemide, amLODipine, and cyclobenzaprine. ? ?Meds ordered this encounter  ?Medications  ? cyclobenzaprine (FLEXERIL) 5 MG tablet  ?  Sig: Take 1 tablet (5 mg total) by mouth 3 (three) times daily as needed for muscle spasms.  ?  Dispense:  20 tablet  ?  Refill:  0  ? ? ? ?Teena Dunk, NP ?  ?

## 2022-04-21 NOTE — Patient Instructions (Signed)
You were seen today in the [facility] for reevaluation of bilateral leg swelling. Labs were collected, results will be available via MyChart or, if abnormal, you will be contacted by clinic staff. You were prescribed medications, please take as directed. Please follow up in 2 mths for reevaluation of symptoms after LEEP procedure.  ?

## 2022-04-22 ENCOUNTER — Telehealth: Payer: Self-pay

## 2022-04-22 ENCOUNTER — Ambulatory Visit: Payer: 59 | Admitting: Occupational Therapy

## 2022-04-22 NOTE — Telephone Encounter (Signed)
S/w pt about results and advised of provider recommendations for appt to discuss results and possible LEEP, transferred to scheduler ?

## 2022-04-24 ENCOUNTER — Ambulatory Visit: Payer: 59 | Admitting: Occupational Therapy

## 2022-04-24 DIAGNOSIS — I890 Lymphedema, not elsewhere classified: Secondary | ICD-10-CM | POA: Diagnosis not present

## 2022-04-24 NOTE — Therapy (Signed)
[place] [facility] ?[place], [place], [address] ?Phone: [phone]   Fax:  [phone] ? ?Occupational Therapy Treatment ? ?Patient Details  ?Name: Barbara Rice ?MRN: [id number] ?Date of Birth: [birth year] ?Referring Provider (OT): Bo Merino, NP ? ? ?Encounter Date: 04/24/2022 ? ? OT End of Session - 04/24/22 1015   ? ? Visit Number 3   ? Number of Visits 36   ? Date for OT Re-Evaluation 06/29/22   ? Progress Note Due on Visit 10   ? OT Start Time 1010   ? OT Stop Time 1110   ? OT Time Calculation (min) 60 min   ? Equipment Utilized During Web designer Flexitouch : ADVANCED sequential pneumatic compression device   ? Activity Tolerance Patient tolerated treatment well;No increased pain   ? Behavior During Therapy Barbara Rice for tasks assessed/performed   ? ?  ?  ? ?  ? ? ?Past Medical History:  ?Diagnosis Date  ? Sciatic nerve pain   ? ? ?No past surgical history on file. ? ?There were no vitals filed for this visit. ? ? Subjective Assessment - 04/24/22 1017   ? ? Subjective  "Barbara Rice" presents for OT Rx visit 3/36 to address BLE lymphedema and associated pain. Pt rates R leg pain at 5/10 this morning. Pt missed last 2 scheduled OT visits. Pt reports 4/10 LE-related leg pain. 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 Patient is accompanied by: --   Barbara Rice -[employer] rmanufacturer's rep  ? Pertinent History Insideous onset BLE swelling with associated pain 10/2021; denies known family hx of limb swelling; HTN (amlodipine!); hx sciatic nerve pain; S/p 02/20/22 US guided FNA of right inguinal lymph node w/ Inguinal lymphadenopathy per imaging; Subsequent cytology ruled out malignancy   ? Limitations Decreased standing and walking tolerance 2/2 chronnic  bilateral leg swelling and associated pain; hgait disturbance,   ? Repetition Increases Symptoms   ? Special Tests - Stemmer bilaterally. Intake FOTO score: 83/100   ? Patient Stated Goals My provider and I are trying to find out the cause of my swollen lymph nodes on both sides.  I hope this helps with pain management.   ? Currently in Pain? Yes   ? Pain Score 4    ? Pain Location Leg   ? Pain Orientation Right;Left   ? Pain Type Chronic pain   ? Pain Onset --   leg swelling and associated pain  onet 11/22  ? ?  ?  ? ?  ? ? ? ? ? ? ? ? ? ? ? ? ? ? ? OT Treatments/Exercises (OP) - 04/24/22 1022   ? ?  ? ADLs  ? ADL Education Given Yes   ?  ? Manual Therapy  ? Manual Therapy Edema management   ? Edema Management trial with Flexitouch advanced sequential pneumatic device   ? ?  ?  ? ?  ? ? ? ? ? ? ? ? ? OT Education - 04/24/22 1025   ? ? Education Details Pt/ family education for basic and advanced sequential pneumatic compression devices, or ?pump?, including clinical indications, how they work, how the basic device differs from the advanced, clinical indications, contraindications and precautions and care and use routines for pneumatic compression devices. Patient/ caregiver?s questions were answered throughout trial  and handouts and Internet resources given for reference.   ? Person(s) Educated Patient   ? Methods Explanation;Demonstration;Handout   ? Comprehension Verbalized understanding;Returned demonstration;Need further instruction   ? ?  ?  ? ?  ? ? ? ? ? ? OT Long Term Goals - 04/01/22 1244   ? ?  ? OT LONG TERM GOAL #1  ? Title Given this patient?s Intake score of 83/100 on the functional outcomes FOTO tool, patient will experience an increase in function of 6 points to 89, to improve basic and instrumental ADLs performance, including lymphedema self-care.   ? Baseline 83/100   ? Time 12   ? Period Weeks   ? Status New   ? Target Date 06/29/22   ?  ? OT LONG TERM GOAL #2  ? Title Pt will achieve at least a  10% limb volume reduction from ankle to groin bilaterally to return limb to more normal size and shape, to limit infection risk, to decrease pain, to improve function, and to limit lymphedema progression.   ? Baseline dependent   ? Time 12   ? Period Days   ? Status New   ? Target Date 06/29/22   ?  ? OT LONG TERM GOAL #3  ? Title Pt will achieve and sustain a least 85% compliance with all LE self-care home program components throughout OT for CDT, including daily skin inspection and care, lymphatic pumping ther ex, 23/7 compression wraps and simple self-MLD, to sustain clinical gains made in CDT and to limit lymphedema progression and further functional decline.   ? Baseline Max A   ? Time 12   ? Period Weeks   ? Status New   ? Target Date 06/29/22   ?  ? OT LONG TERM GOAL #4  ? Title Pt will be able to donn/doff appropriate compression garments and devices and follow recommended wear and care schedule by DC for optimal LE self-management over time.   ? Baseline Max A   ? Time 12   ? Period Weeks   ? Status New   ? Target Date 06/29/22   ?  ? OT LONG TERM GOAL #5  ? Title Pt will demonstrate understanding of lymphedema prevention strategies by identifying and discussing 5 precautions using printed reference (modified assistance) to reduce risk of progression and to limit infection risk.   ? Baseline Max A   ? Time 4   ? Period Days   ? Status New   ? Target Date --   4th OT Rx visit  ?  ? Long Term Additional Goals  ? Additional Long Term Goals Yes   ? ?  ?  ? ?  ? ? ? ? ? ? ? ? Plan - 04/24/22 1027   ? ? Clinical Impression Statement Barbara Rice presents with mild, stage II, primary, BLE/BLQ lymphedema. She presents today, OT visit 3, for trial with advanced sequential compression device, Flexitouch. ?Barbara Rice? completed a 1hour trial with the advanced Flexitouch Plus pneumatic compression device [id number]) using 30-50 mmHg compression on the LLE/LLQ without increased pain. This advanced device with BLE garments  and truncal garment is medically necessary to assist this patient with optimal daily lymphedema self-care over time at home. The advanced, 32-chamber Flexitouch is the only available sequential pneumatic compression device that provides proximal to distal fluid return via regional lymph nodes, deep abdominal pathways and the thoracic duct to the heart. The basic pneumatic pump is not appropriate for this patient because  it provides distal-to-proximal, retrograde massage mobilizing tissue fluid against back abruptly ending distal to the regional lymph nodes in the groin leaving a dense ring of protein rich fluid distal to the inguinal lymph nodes in the thigh. The Flexitouch Plus [phone] Advanced Pneumatic Compression Device is medically necessary to treat this patient's symptoms of BLE, leg, thigh and abdominal lymphedema.   ? OT Occupational Profile and History Detailed Assessment- Review of Records and additional review of physical, cognitive, psychosocial history related to current functional performance   ? Occupational performance deficits (Please refer to evaluation for details): ADL's;IADL's;Rest and Sleep;Work;Leisure;Social Participation;Other   body image; functional ambulation  and  mobility  ? Rehab Potential Good   ? Clinical Decision Making Limited treatment options, no task modification necessary   ? Comorbidities Affecting Occupational Performance: Presence of comorbidities impacting occupational performance   ? Comorbidities impacting occupational performance description: HTN   ? Modification or Assistance to Complete Evaluation  No modification of tasks or assist necessary to complete eval   ? OT Frequency 1x / week   1 x weekly for 6 weeks tinitially. We'll focus on fitting off-the-shelf compression thigh highs initially- considering ccl 2 (30-40 mmHg)  or 3 (40-50 mmHg) for 12 hour shifts on feet, and providing MLD.  ? OT Duration 6 weeks   ? OT Treatment/Interventions Self-care/ADL training;DME  and/or AE instruction;Manual lymph drainage;Compression bandaging;Therapeutic activities;Coping strategies training;Therapeutic exercise;Other (comment);Manual Therapy;Energy conservation;Patient/family edu

## 2022-04-28 ENCOUNTER — Ambulatory Visit: Payer: 59 | Attending: Nurse Practitioner | Admitting: Occupational Therapy

## 2022-04-28 DIAGNOSIS — I890 Lymphedema, not elsewhere classified: Secondary | ICD-10-CM | POA: Insufficient documentation

## 2022-04-30 ENCOUNTER — Encounter: Payer: Self-pay | Admitting: Obstetrics and Gynecology

## 2022-04-30 ENCOUNTER — Telehealth: Payer: 59 | Admitting: Obstetrics

## 2022-04-30 ENCOUNTER — Telehealth (INDEPENDENT_AMBULATORY_CARE_PROVIDER_SITE_OTHER): Payer: 59 | Admitting: Obstetrics and Gynecology

## 2022-04-30 DIAGNOSIS — R87612 Low grade squamous intraepithelial lesion on cytologic smear of cervix (LGSIL): Secondary | ICD-10-CM | POA: Insufficient documentation

## 2022-04-30 NOTE — Progress Notes (Signed)
S/w pt for virtual visit to discuss upcoming LEEP procedure. ?

## 2022-04-30 NOTE — Progress Notes (Signed)
? ? ?  GYNECOLOGY VIRTUAL VISIT ENCOUNTER NOTE ? ?Provider location: [facility] [place]  ? ?Patient location: Home ? ?I connected with [name] on 04/30/22 at  1:30 PM EDT by MyChart Video Encounter and verified that I am speaking with the correct person using two identifiers. ?  ?I discussed the limitations, risks, security and privacy concerns of performing an evaluation and management service virtually and the availability of in person appointments. I also discussed with the patient that there may be a patient responsible charge related to this service. The patient expressed understanding and agreed to proceed. ?  ?History:  ?Barbara Rice is a 26 y.o. No obstetric history on file. female being evaluated today for LEEP. 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 She denies any abnormal vaginal discharge, bleeding, pelvic pain or other concerns.   ?  ?  ?Past Medical History:  ?Diagnosis Date  ? Sciatic nerve pain   ? ?History reviewed. No pertinent surgical history. ?The following portions of the patient's history were reviewed and updated as appropriate: allergies, current medications, past family history, past medical history, past social history, past surgical history and problem list.  ? ? ?Review of Systems:  ?Pertinent items noted in HPI and remainder of comprehensive ROS otherwise negative. ? ?Physical Exam:  ? ?General:  Alert, oriented and cooperative. Patient appears to be in no acute distress.  ?Mental Status: Normal mood and affect. Normal behavior. Normal judgment and thought content.   ?Respiratory: Normal respiratory effort, no problems with respiration noted  ?Rest of physical exam deferred due to type of encounter ? ?Labs and Imaging ?No results found for this or any previous visit (from the past 336 hour(s)). ?No results found.   ?  ?Assessment and Plan:  ?   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 ?LEEP recommended and reviewed with pt. Information sent to pt via my  chart. ?Will schedule 1 week after cycle. ? ?   ?  ?I discussed the assessment and treatment plan with the patient. The patient was provided an opportunity to ask questions and all were answered. The patient agreed with the plan and demonstrated an understanding of the instructions. ?  ?The patient was advised to call back or seek an in-person evaluation/go to the ED if the symptoms worsen or if the condition fails to improve as anticipated. ? ?I provided 8 minutes of face-to-face time during this encounter. ? ? ?Hermina Staggers, MD ?Center for [facility], [facility]  ?

## 2022-05-01 ENCOUNTER — Ambulatory Visit: Payer: 59 | Admitting: Occupational Therapy

## 2022-05-01 DIAGNOSIS — I890 Lymphedema, not elsewhere classified: Secondary | ICD-10-CM

## 2022-05-01 NOTE — Patient Instructions (Signed)

## 2022-05-01 NOTE — Therapy (Signed)
[place] [facility] ?[address] ?[place], [place], [address] ?Phone: [phone]   Fax:  [phone] ? ?Occupational Therapy Treatment ? ?Patient Details  ?Name: Barbara Rice ?MRN: [id number] ?Date of Birth: [birth year] ?Referring Provider (OT): Orion Crook, NP ? ? ?Encounter Date: 05/01/2022 ? ? OT End of Session - 05/01/22 1017   ? ? Visit Number 5   ? Number of Visits 36   ? Date for OT Re-Evaluation 06/29/22   ? Progress Note Due on Visit 10   ? OT Start Time 1005   ? OT Stop Time 1105   ? OT Time Calculation (min) 60 min   ? Equipment Utilized During Medical illustrator Flexitouch : ADVANCED sequential pneumatic compression device   ? Activity Tolerance Patient tolerated treatment well;No increased pain   ? Behavior During Therapy [facility] for tasks assessed/performed   ? ?  ?  ? ?  ? ? ?Past Medical History:  ?Diagnosis Date  ? Sciatic nerve pain   ? ? ?No past surgical history on file. ? ?There were no vitals filed for this visit. ? ? Subjective Assessment - 05/01/22 1256   ? ? Subjective  "Barbara Rice" presents for OT Rx visit43/36 to address BLE lymphedema and associated pain. Pt does not rate LE related pain numerically this morning. Pt states she will miss next 2 weeks of OT as she is going on vacation. Pt is agreeable with plan to complete measurements for off-the-shelf compression stockings  today.   ? Patient is accompanied by: --   Karrie Meres -[employer] rmanufacturer's rep  ? Pertinent History Insideous onset BLE swelling with associated pain 10/2021; denies known family hx of limb swelling; HTN (amlodipine!); hx sciatic nerve pain; S/p 02/20/22 US guided FNA of right inguinal lymph node w/ Inguinal lymphadenopathy per imaging; Subsequent cytology ruled out malignancy   ? Limitations Decreased standing and walking tolerance 2/2 chronnic bilateral leg swelling and associated pain; hgait disturbance,   ? Repetition Increases Symptoms   ?  Special Tests - Stemmer bilaterally. Intake FOTO score: 83/100   ? Patient Stated Goals My provider and I are trying to find out the cause of my swollen lymph nodes on both sides.  I hope this helps with pain management.   ? Pain Onset --   leg swelling and associated pain  onet 11/22  ? ?  ?  ? ?  ? ? ? ? ? ? ? ? ? ? ? ? ? ? ? OT Treatments/Exercises (OP) - 05/01/22 1309   ? ?  ? ADLs  ? ADL Education Given Yes   ?  ? Manual Therapy  ? Manual Therapy Edema management;Manual Lymphatic Drainage (MLD);Compression Bandaging   ? Manual Lymphatic Drainage (MLD) MLD to RLE/RLQ utilizing short neck sequence, deep abdominal breathing, functional inguinal LN, and proximal to distal J strokes from groin to foot, then back and ending with terminus. Goof tolerance.   ? Compression Bandaging Thigh highs: Juzo SOFT ( 2002) ccl 2 (30-40 mmHg) A-G, SIZE III, REG lengthopen toe, silicone top band. Knee Highs: Juzo DYNAMIC (3512) , A-D,  ccl 2 (30-40 mmHg) open toe, silicone top band. The DYNAMIC fabric provides fabric with more agressive containment for demands of 12 hour shifts on her feet at work. Juzo SOFT thigh Highs are same compression class with lighter duty containment and are typically more comfortable , stretchier fabric.   ? ?  ?  ? ?  ? ? ? ? ? ? ? ? ?  OT Education - 05/01/22 1308   ? ? Education Details Pt edu focused on differences between custom  flat knit and off-the-shelf circular elastic compression garments.  Educated Pt re indications for both and pros and cons of each, and rational for therapist's current recommendations  Educated Pt re estimated costs, measuring and fitting process ,and DME vendor's involvement and access to insurance benefits if needed.   ? Person(s) Educated Patient   ? Methods Explanation;Demonstration;Handout   ? Comprehension Verbalized understanding;Returned demonstration;Need further instruction   ? ?  ?  ? ?  ? ? ? ? ? ? OT Long Term Goals - 04/01/22 1244   ? ?  ? OT LONG TERM GOAL #1   ? Title Given this patient?s Intake score of 83/100 on the functional outcomes FOTO tool, patient will experience an increase in function of 6 points to 89, to improve basic and instrumental ADLs performance, including lymphedema self-care.   ? Baseline 83/100   ? Time 12   ? Period Weeks   ? Status New   ? Target Date 06/29/22   ?  ? OT LONG TERM GOAL #2  ? Title Pt will achieve at least a 10% limb volume reduction from ankle to groin bilaterally to return limb to more normal size and shape, to limit infection risk, to decrease pain, to improve function, and to limit lymphedema progression.   ? Baseline dependent   ? Time 12   ? Period Days   ? Status New   ? Target Date 06/29/22   ?  ? OT LONG TERM GOAL #3  ? Title Pt will achieve and sustain a least 85% compliance with all LE self-care home program components throughout OT for CDT, including daily skin inspection and care, lymphatic pumping ther ex, 23/7 compression wraps and simple self-MLD, to sustain clinical gains made in CDT and to limit lymphedema progression and further functional decline.   ? Baseline Max A   ? Time 12   ? Period Weeks   ? Status New   ? Target Date 06/29/22   ?  ? OT LONG TERM GOAL #4  ? Title Pt will be able to donn/doff appropriate compression garments and devices and follow recommended wear and care schedule by DC for optimal LE self-management over time.   ? Baseline Max A   ? Time 12   ? Period Weeks   ? Status New   ? Target Date 06/29/22   ?  ? OT LONG TERM GOAL #5  ? Title Pt will demonstrate understanding of lymphedema prevention strategies by identifying and discussing 5 precautions using printed reference (modified assistance) to reduce risk of progression and to limit infection risk.   ? Baseline Max A   ? Time 4   ? Period Days   ? Status New   ? Target Date --   4th OT Rx visit  ?  ? Long Term Additional Goals  ? Additional Long Term Goals Yes   ? ?  ?  ? ?  ? ? ? ? ? ? ? ? Plan - 05/01/22 1028   ? ? Clinical Impression  Statement Completed anatomical measurements of BLE  to determine size needed for OTS compression knee and thigh high garments. Pt requires size 3, regular length, ccl 2 (30-40 mmHg) Juzo dynamic (3512) knee highs and agrees to try Juzo size III, thigh highs- same config. Provided on line resources for several web sellers with excellent customer service for lymphedema  products. Cont MLD to RLE as established. Good tolerance. Cont as per POC.   ? OT Occupational Profile and History Detailed Assessment- Review of Records and additional review of physical, cognitive, psychosocial history related to current functional performance   ? Occupational performance deficits (Please refer to evaluation for details): ADL's;IADL's;Rest and Sleep;Work;Leisure;Social Participation;Other   body image; functional ambulation  and  mobility  ? Rehab Potential Good   ? Clinical Decision Making Limited treatment options, no task modification necessary   ? Comorbidities Affecting Occupational Performance: Presence of comorbidities impacting occupational performance   ? Comorbidities impacting occupational performance description: HTN   ? Modification or Assistance to Complete Evaluation  No modification of tasks or assist necessary to complete eval   ? OT Frequency 1x / week   1 x weekly for 6 weeks tinitially. We'll focus on fitting off-the-shelf compression thigh highs initially- considering ccl 2 (30-40 mmHg)  or 3 (40-50 mmHg) for 12 hour shifts on feet, and providing MLD.  ? OT Duration 6 weeks   ? OT Treatment/Interventions Self-care/ADL training;DME and/or AE instruction;Manual lymph drainage;Compression bandaging;Therapeutic activities;Coping strategies training;Therapeutic exercise;Other (comment);Manual Therapy;Energy conservation;Patient/family education   ? Plan Modified Intensive Phase CDT: 1 x weekly for 6 weeks tinitially. We'll focus on fitting off-the-shelf compression thigh highs , especially for working hours!-  considering ccl 2 (30-40 mmHg)  or 3 (40-50 mmHg) . Will teach all LE self care skills, including simple self-MLD, skin care, ther ex and compression options based on activity demands. If no volumetric change will com

## 2022-05-05 ENCOUNTER — Encounter: Payer: 59 | Admitting: Occupational Therapy

## 2022-05-08 ENCOUNTER — Encounter: Payer: 59 | Admitting: Occupational Therapy

## 2022-05-12 ENCOUNTER — Encounter: Payer: 59 | Admitting: Occupational Therapy

## 2022-05-15 ENCOUNTER — Encounter: Payer: 59 | Admitting: Occupational Therapy

## 2022-05-19 ENCOUNTER — Ambulatory Visit: Payer: 59 | Admitting: Occupational Therapy

## 2022-05-22 ENCOUNTER — Ambulatory Visit: Payer: 59 | Admitting: Occupational Therapy

## 2022-05-22 DIAGNOSIS — I890 Lymphedema, not elsewhere classified: Secondary | ICD-10-CM

## 2022-05-22 NOTE — Therapy (Signed)
[facility] MAIN [facility] SERVICES [address] [place], [place], [address] Phone: [phone]   Fax:  [phone]  Occupational Therapy Treatment  Patient Details  Name: Barbara Rice MRN: [id number] Date of Birth: [birth year] Referring Provider (OT): Orion Crook, NP   Encounter Date: 05/22/2022   OT End of Session - 05/22/22 0828     Visit Number 5    Number of Visits 36    Date for OT Re-Evaluation 06/29/22    Progress Note Due on Visit 10    OT Start Time 0825    OT Stop Time 0900    OT Time Calculation (min) 35 min    Equipment Utilized During Treatment --    Activity Tolerance Patient tolerated treatment well;No increased pain    Behavior During Therapy WFL for tasks assessed/performed             Past Medical History:  Diagnosis Date   Sciatic nerve pain     No past surgical history on file.  There were no vitals filed for this visit.   Subjective Assessment - 05/22/22 0903     Subjective  "[name]" presents for OT Rx to address BLE lymphedema and associated pain. Pt does not rate LE related pain numerically this morning, but does endorse pain severe enough to cause her to call out of work, Pt was last seen on 05/01/22, before her vacation to [place].    Patient is accompanied by: --   Karrie Meres -[employer] rmanufacturer's rep   Pertinent History Insideous onset BLE swelling with associated pain 10/2021; denies known family hx of limb swelling; HTN (amlodipine!); hx sciatic nerve pain; S/p 02/20/22 US guided FNA of right inguinal lymph node w/ Inguinal lymphadenopathy per imaging; Subsequent cytology ruled out malignancy    Limitations Decreased standing and walking tolerance 2/2 chronnic bilateral leg swelling and associated pain; hgait disturbance,    Repetition Increases Symptoms    Special Tests - Stemmer bilaterally. Intake FOTO score: 83/100    Patient Stated Goals My provider and I are trying to find out the  cause of my swollen lymph nodes on both sides.  I hope this helps with pain management.    Currently in Pain? Yes    Pain Location Leg    Pain Orientation Right;Left    Pain Descriptors / Indicators Aching;Sore;Tiring;Heaviness    Pain Onset --   leg swelling and associated pain  onet 11/22                         OT Treatments/Exercises (OP) - 05/22/22 1043       ADLs   ADL Education Given Yes      Manual Therapy   Manual Therapy Compression Bandaging;Edema management;Manual Lymphatic Drainage (MLD)    Manual Lymphatic Drainage (MLD) MLD to RLE/RLQ utilizing short neck sequence, deep abdominal breathing, functional inguinal LN, and proximal to distal J strokes from groin to foot, then back and ending with terminus. Goof tolerance.    Compression Bandaging Applied 1 each short stretch bandage, 8 cm and 10 cm wide, over single layer of circumferentially applied 0.04 cm thick Rosidal foam and cotton stockinett from base of toes to popliteal fossa using gradient techniques.                    OT Education - 05/22/22 1046     Education Details Pt edu for short neck sequence and J stroke in prep  for teaching full LE sequence of simple self-MLD. Excellent return. Entry level lesson on shoprt stretch vs long stretch technology.    Person(s) Educated Patient    Methods Explanation;Demonstration;Handout    Comprehension Verbalized understanding;Returned demonstration;Need further instruction                 OT Long Term Goals - 04/01/22 1244       OT LONG TERM GOAL #1   Title Given this patient's Intake score of 83/100 on the functional outcomes FOTO tool, patient will experience an increase in function of 6 points to 89, to improve basic and instrumental ADLs performance, including lymphedema self-care.    Baseline 83/100    Time 12    Period Weeks    Status New    Target Date 06/29/22      OT LONG TERM GOAL #2   Title Pt will achieve at least a 10%  limb volume reduction from ankle to groin bilaterally to return limb to more normal size and shape, to limit infection risk, to decrease pain, to improve function, and to limit lymphedema progression.    Baseline dependent    Time 12    Period Days    Status New    Target Date 06/29/22      OT LONG TERM GOAL #3   Title Pt will achieve and sustain a least 85% compliance with all LE self-care home program components throughout OT for CDT, including daily skin inspection and care, lymphatic pumping ther ex, 23/7 compression wraps and simple self-MLD, to sustain clinical gains made in CDT and to limit lymphedema progression and further functional decline.    Baseline Max A    Time 12    Period Weeks    Status New    Target Date 06/29/22      OT LONG TERM GOAL #4   Title Pt will be able to donn/doff appropriate compression garments and devices and follow recommended wear and care schedule by DC for optimal LE self-management over time.    Baseline Max A    Time 12    Period Weeks    Status New    Target Date 06/29/22      OT LONG TERM GOAL #5   Title Pt will demonstrate understanding of lymphedema prevention strategies by identifying and discussing 5 precautions using printed reference (modified assistance) to reduce risk of progression and to limit infection risk.    Baseline Max A    Time 4    Period Days    Status New    Target Date --   4th OT Rx visit     Long Term Additional Goals   Additional Long Term Goals Yes                   Plan - 05/22/22 1036     Clinical Impression Statement Pt with increased leg pain since  returning from vacation when her activity level was increased. She was last seen by OT on 05/01/22. Provided RLE/RLQ MLD in long sitting utilizing functional inguinal LN. Pt taught J stoke and short neck sequence for self-MLD with Good tolerance and good return. Applied multilayer knee length compression wraps to RLE from base of toes to tibial tuberosity  in effort to increase lymphatic flow, to reduce inflammation  distally and to relieve pain. Next visit we'll continue teaching self MLD and compression wrapping. Pt has not yet ordered recommended compression stockings. Cont as per POC.  OT Occupational Profile and History Detailed Assessment- Review of Records and additional review of physical, cognitive, psychosocial history related to current functional performance    Occupational performance deficits (Please refer to evaluation for details): ADL's;IADL's;Rest and Sleep;Work;Leisure;Social Participation;Other   body image; functional ambulation  and  mobility   Rehab Potential Good    Clinical Decision Making Limited treatment options, no task modification necessary    Comorbidities Affecting Occupational Performance: Presence of comorbidities impacting occupational performance    Comorbidities impacting occupational performance description: HTN    Modification or Assistance to Complete Evaluation  No modification of tasks or assist necessary to complete eval    OT Frequency 1x / week   1 x weekly for 6 weeks tinitially. We'll focus on fitting off-the-shelf compression thigh highs initially- considering ccl 2 (30-40 mmHg)  or 3 (40-50 mmHg) for 12 hour shifts on feet, and providing MLD.   OT Duration 6 weeks    OT Treatment/Interventions Self-care/ADL training;DME and/or AE instruction;Manual lymph drainage;Compression bandaging;Therapeutic activities;Coping strategies training;Therapeutic exercise;Other (comment);Manual Therapy;Energy conservation;Patient/family education    Plan Modified Intensive Phase CDT: 1 x weekly for 6 weeks tinitially. We'll focus on fitting off-the-shelf compression thigh highs , especially for working hours!- considering ccl 2 (30-40 mmHg)  or 3 (40-50 mmHg) . Will teach all LE self care skills, including simple self-MLD, skin care, ther ex and compression options based on activity demands. If no volumetric change will  commence multilayer gradient compression wrapping to LLE  and complete standard Intensive phase CDT.    Recommended Other Services Consider changing AmLODapine for alternative med since Norvasc has leg and ankle swelling as common side effect    Consulted and Agree with Plan of Care Patient             Patient will benefit from skilled therapeutic intervention in order to improve the following deficits and impairments:           Visit Diagnosis: Lymphedema, not elsewhere classified    Problem List Patient Active Problem List   Diagnosis Date Noted   LGSIL on Pap smear of cervix 04/30/2022   Inguinal lymphadenopathy 01/26/2022    Loel Dubonnet, MS, OTR/L, [facility] 05/22/22 10:48 AM   [place] [facility] MAIN [facility] SERVICES [address] [place], [place], [address] Phone: [phone]   Fax:  [phone]  Name: Candas Deemer MRN: [id number] Date of Birth: May 11, 1996

## 2022-05-27 ENCOUNTER — Ambulatory Visit: Payer: 59 | Admitting: Occupational Therapy

## 2022-05-29 ENCOUNTER — Ambulatory Visit: Payer: 59 | Attending: Nurse Practitioner | Admitting: Occupational Therapy

## 2022-05-29 DIAGNOSIS — I890 Lymphedema, not elsewhere classified: Secondary | ICD-10-CM | POA: Diagnosis present

## 2022-05-29 NOTE — Therapy (Signed)
[place] MAIN [facility] SERVICES [address] [place], [place], [address] Phone: [phone]   Fax:  [phone]  Occupational Therapy Treatment  Patient Details  Name: Barbara Rice MRN: [id number] Date of Birth: 09/25/1996 Referring Provider (OT): Bo Merino, NP   Encounter Date: 05/29/2022   OT End of Session - 05/29/22 1625     Visit Number 6    Number of Visits 36    Date for OT Re-Evaluation 06/29/22    Progress Note Due on Visit 10    OT Start Time 1110    OT Stop Time 1215    OT Time Calculation (min) 65 min    Activity Tolerance Patient tolerated treatment well;No increased pain    Behavior During Therapy WFL for tasks assessed/performed             Past Medical History:  Diagnosis Date   Sciatic nerve pain     No past surgical history on file.  There were no vitals filed for this visit.   Subjective Assessment - 05/29/22 1627     Subjective  "[name]" presents for OT Rx to address BLE lymphedema and associated pain. Pt does not rate LE related pain numerically. . Pt does not bring compression wraps back to clinic today. She tells me she removed them      late on the night after her last session as they felt increasingly tight over time. Pt states she made an appointment with her podiatrist and is interested in consulting with a vascular doctor to check on status of her blood vessels and circulation.    Patient is accompanied by: --   Jerrell Mylar -[employer] rmanufacturer's rep   Pertinent History Insideous onset BLE swelling with associated pain 10/2021; denies known family hx of limb swelling; HTN (amlodipine!); hx sciatic nerve pain; S/p 02/20/22 US guided FNA of right inguinal lymph node w/ Inguinal lymphadenopathy per imaging; Subsequent cytology ruled out malignancy    Limitations Decreased standing and walking tolerance 2/2 chronnic bilateral leg swelling and associated pain; hgait disturbance,     Repetition Increases Symptoms    Special Tests - Stemmer bilaterally. Intake FOTO score: 83/100    Patient Stated Goals My provider and I are trying to find out the cause of my swollen lymph nodes on both sides.  I hope this helps with pain management.    Pain Onset --   leg swelling and associated pain  onet 11/22                         OT Treatments/Exercises (OP) - 05/29/22 1632       ADLs   ADL Education Given Yes      Manual Therapy   Manual Therapy Compression Bandaging;Edema management;Manual Lymphatic Drainage (MLD)    Manual Lymphatic Drainage (MLD) MLD to RLE/RLQ utilizing short neck sequence, deep abdominal breathing, functional inguinal LN, and proximal to distal J strokes from groin to foot, then back and ending with terminus. Goof tolerance.    Compression Bandaging Kinesiotape application to soles of feet bilaterally to reduce possible chronic plantar inflammation                    OT Education - 05/29/22 1633     Education Details Continued Pt edu for short neck sequence and J stroke    Person(s) Educated Patient    Methods Explanation;Demonstration;Handout    Comprehension Verbalized understanding;Returned demonstration;Need further instruction  OT Long Term Goals - 04/01/22 1244       OT LONG TERM GOAL #1   Title Given this patient's Intake score of 83/100 on the functional outcomes FOTO tool, patient will experience an increase in function of 6 points to 89, to improve basic and instrumental ADLs performance, including lymphedema self-care.    Baseline 83/100    Time 12    Period Weeks    Status New    Target Date 06/29/22      OT LONG TERM GOAL #2   Title Pt will achieve at least a 10% limb volume reduction from ankle to groin bilaterally to return limb to more normal size and shape, to limit infection risk, to decrease pain, to improve function, and to limit lymphedema progression.    Baseline dependent     Time 12    Period Days    Status New    Target Date 06/29/22      OT LONG TERM GOAL #3   Title Pt will achieve and sustain a least 85% compliance with all LE self-care home program components throughout OT for CDT, including daily skin inspection and care, lymphatic pumping ther ex, 23/7 compression wraps and simple self-MLD, to sustain clinical gains made in CDT and to limit lymphedema progression and further functional decline.    Baseline Max A    Time 12    Period Weeks    Status New    Target Date 06/29/22      OT LONG TERM GOAL #4   Title Pt will be able to donn/doff appropriate compression garments and devices and follow recommended wear and care schedule by DC for optimal LE self-management over time.    Baseline Max A    Time 12    Period Weeks    Status New    Target Date 06/29/22      OT LONG TERM GOAL #5   Title Pt will demonstrate understanding of lymphedema prevention strategies by identifying and discussing 5 precautions using printed reference (modified assistance) to reduce risk of progression and to limit infection risk.    Baseline Max A    Time 4    Period Days    Status New    Target Date --   4th OT Rx visit     Long Term Additional Goals   Additional Long Term Goals Yes                   Plan - 05/29/22 1625     Clinical Impression Statement Completed RLE MLD as established. Applied kinesiotape to bottom of both feet to address plantar fascia inflammation due long hours standing/ walking at work . Applied multilayer knee length compression wraps to RLE from base of toes to tibial tuberosity in effort to increase lymphatic flow, to reduce inflammation  distally and to relieve pain.Pt has not yet ordered recommended compression stockings. Cont as per POC.    OT Occupational Profile and History Detailed Assessment- Review of Records and additional review of physical, cognitive, psychosocial history related to current functional performance     Occupational performance deficits (Please refer to evaluation for details): ADL's;IADL's;Rest and Sleep;Work;Leisure;Social Participation;Other   body image; functional ambulation  and  mobility   Rehab Potential Good    Clinical Decision Making Limited treatment options, no task modification necessary    Comorbidities Affecting Occupational Performance: Presence of comorbidities impacting occupational performance    Comorbidities impacting occupational performance description: HTN  Modification or Assistance to Complete Evaluation  No modification of tasks or assist necessary to complete eval    OT Frequency 1x / week   1 x weekly for 6 weeks tinitially. We'll focus on fitting off-the-shelf compression thigh highs initially- considering ccl 2 (30-40 mmHg)  or 3 (40-50 mmHg) for 12 hour shifts on feet, and providing MLD.   OT Duration 6 weeks    OT Treatment/Interventions Self-care/ADL training;DME and/or AE instruction;Manual lymph drainage;Compression bandaging;Therapeutic activities;Coping strategies training;Therapeutic exercise;Other (comment);Manual Therapy;Energy conservation;Patient/family education    Plan Modified Intensive Phase CDT: 1 x weekly for 6 weeks tinitially. We'll focus on fitting off-the-shelf compression thigh highs , especially for working hours!- considering ccl 2 (30-40 mmHg)  or 3 (40-50 mmHg) . Will teach all LE self care skills, including simple self-MLD, skin care, ther ex and compression options based on activity demands. If no volumetric change will commence multilayer gradient compression wrapping to LLE  and complete standard Intensive phase CDT.    Recommended Other Services Consider changing AmLODapine for alternative med since Norvasc has leg and ankle swelling as common side effect    Consulted and Agree with Plan of Care Patient             Patient will benefit from skilled therapeutic intervention in order to improve the following deficits and impairments:            Visit Diagnosis: Lymphedema, not elsewhere classified    Problem List Patient Active Problem List   Diagnosis Date Noted   LGSIL on Pap smear of cervix 04/30/2022   Inguinal lymphadenopathy 01/26/2022    Andrey Spearman, MS, OTR/L, [facility] 05/29/22 4:35 PM   [place] MAIN [facility] SERVICES [address] [place], [place], [address] Phone: [phone]   Fax:  [phone]  Name: Barbara Rice MRN: [id number] Date of Birth: 1996/06/19

## 2022-05-29 NOTE — Patient Instructions (Signed)

## 2022-06-02 ENCOUNTER — Ambulatory Visit: Payer: 59 | Admitting: Occupational Therapy

## 2022-06-04 ENCOUNTER — Ambulatory Visit (INDEPENDENT_AMBULATORY_CARE_PROVIDER_SITE_OTHER): Payer: 59 | Admitting: Nurse Practitioner

## 2022-06-04 ENCOUNTER — Encounter: Payer: Self-pay | Admitting: Nurse Practitioner

## 2022-06-04 VITALS — BP 124/94 | HR 78 | Temp 97.6°F | Ht 65.0 in | Wt 172.4 lb

## 2022-06-04 DIAGNOSIS — I10 Essential (primary) hypertension: Secondary | ICD-10-CM

## 2022-06-04 DIAGNOSIS — R590 Localized enlarged lymph nodes: Secondary | ICD-10-CM

## 2022-06-04 DIAGNOSIS — M79604 Pain in right leg: Secondary | ICD-10-CM

## 2022-06-04 DIAGNOSIS — M79605 Pain in left leg: Secondary | ICD-10-CM | POA: Diagnosis not present

## 2022-06-04 MED ORDER — ACETAMINOPHEN-CODEINE 300-30 MG PO TABS: 1.0000 | ORAL_TABLET | ORAL | 0 refills | Status: AC | PRN

## 2022-06-04 MED ORDER — AMLODIPINE BESYLATE 10 MG PO TABS: 10.0000 mg | ORAL_TABLET | Freq: Every day | ORAL | 2 refills | Status: DC

## 2022-06-04 NOTE — Progress Notes (Signed)
[place],   [address] Phone:  [phone]   Fax:  [phone] Subjective:   Patient ID: Barbara Rice, female    DOB: [birth year], 25 y.o.   MRN: [id number]  Chief Complaint  Patient presents with   Follow-up    2 month follow up;Leg swelling  Patient states she is still having pains in both her legs but no swelling. Patient would like to see if she can get an ultrasound.   HPI Barbara Rice 26 y.o. female  has a past medical history of Sciatic nerve pain. [facility]LE [facility].  Patient states that since previous visit, swelling has improved but she continues to have pain in the bilateral knees and ankles. Pain is the most pronounced in the mornings. Has been compliant with all medications. Has not completed LEEP, scheduled for 6/21. Requesting vascular US be ordered for further evaluation of pain, in addition to labs. Patient also requesting additional pain management. Denies any other concerns today.  Denies any fatigue, chest pain, shortness of breath, HA or dizziness. Denies any blurred vision, numbness or tingling.  Past Medical History:  Diagnosis Date   Sciatic nerve pain     History reviewed. No pertinent surgical history.  Family History  Problem Relation Age of Onset   Prostatitis Father    Hypertension Father    Cervical cancer Paternal Aunt    Ovarian cancer Paternal Aunt    Cancer Cousin     Social History   Socioeconomic History   Marital status: Married    Spouse name: Not on file   Number of children: Not on file   Years of education: Not on file   Highest education level: Not on file  Occupational History   Not on file  Tobacco Use   Smoking status: Never   Smokeless tobacco: Never  Vaping Use   Vaping Use: Never used  Substance and Sexual Activity   Alcohol use: Never   Drug use: Never   Sexual activity: Not Currently  Other Topics Concern   Not on file   Social History Narrative   Not on file   Social Determinants of Health   Financial Resource Strain: Not on file  Food Insecurity: Not on file  Transportation Needs: Not on file  Physical Activity: Not on file  Stress: Not on file  Social Connections: Not on file  Intimate Partner Violence: Not on file    Outpatient Medications Prior to Visit  Medication Sig Dispense Refill   cyclobenzaprine (FLEXERIL) 5 MG tablet Take 1 tablet (5 mg total) by mouth 3 (three) times daily as needed for muscle spasms. 20 tablet 0   docusate sodium (COLACE) 100 MG capsule Take 1 capsule (100 mg total) by mouth 2 (two) times daily. (Patient not taking: Reported on 06/04/2022) 10 capsule 0   amLODipine (NORVASC) 10 MG tablet Take 1 tablet (10 mg total) by mouth daily. 30 tablet 2   No facility-administered medications prior to visit.    No Known Allergies  Review of Systems  Constitutional:  Negative for chills, fever and malaise/fatigue.  Respiratory:  Negative for cough and shortness of breath.   Cardiovascular:  Negative for chest pain, palpitations and leg swelling.  Gastrointestinal:  Negative for abdominal pain, blood in stool, constipation, diarrhea, nausea and vomiting.  Musculoskeletal:        See HPI, leg pain  Skin: Negative.   Neurological: Negative.   Psychiatric/Behavioral:  Negative for depression. The patient is not nervous/anxious.   All other systems reviewed and are negative.      Objective:    Physical Exam Constitutional:      General: She is not in acute distress.    Appearance: Normal appearance. She is normal weight.  HENT:     Head: Normocephalic.  Neck:     Vascular: No carotid bruit.  Cardiovascular:     Rate and Rhythm: Normal rate and regular rhythm.     Pulses: Normal pulses.     Heart sounds: Normal heart sounds.     Comments: No obvious peripheral edema Pulmonary:     Effort: Pulmonary effort is normal.     Breath sounds: Normal breath sounds.   Musculoskeletal:        General: No swelling, tenderness, deformity or signs of injury. Normal range of motion.     Cervical back: Normal range of motion and neck supple. No rigidity or tenderness.     Right lower leg: No edema.     Left lower leg: No edema.  Lymphadenopathy:     Cervical: No cervical adenopathy.  Skin:    General: Skin is warm and dry.     Capillary Refill: Capillary refill takes less than 2 seconds.  Neurological:     General: No focal deficit present.     Mental Status: She is alert and oriented to person, place, and time.  Psychiatric:        Mood and Affect: Mood normal.        Behavior: Behavior normal.        Thought Content: Thought content normal.        Judgment: Judgment normal.     BP (!) 124/94   Pulse 78   Temp 97.6 F (36.4 C)   Ht 5' 5"  (1.651 m)   Wt 172 lb 6.4 oz (78.2 kg)   SpO2 99%   BMI 28.69 kg/m  Wt Readings from Last 3 Encounters:  06/04/22 172 lb 6.4 oz (78.2 kg)  04/21/22 171 lb 4 oz (77.7 kg)  04/15/22 166 lb 12.8 oz (75.7 kg)    Immunization History  Administered Date(s) Administered   Influenza-Unspecified 10/09/2021    Diabetic Foot Exam - Simple   No data filed     Rice Results  Component Value Date   TSH 0.466 11/29/2021   Rice Results  Component Value Date   WBC 3.3 (L) 06/04/2022   HGB 11.7 06/04/2022   HCT 37.1 06/04/2022   MCV 88 06/04/2022   PLT CANCELED 06/04/2022   Rice Results  Component Value Date   NA 138 06/04/2022   K 4.3 06/04/2022   CO2 22 06/04/2022   GLUCOSE 79 06/04/2022   BUN 8 06/04/2022   CREATININE 0.72 06/04/2022   BILITOT 0.5 06/04/2022   ALKPHOS 53 06/04/2022   AST 22 06/04/2022   ALT 12 06/04/2022   PROT 7.5 06/04/2022   ALBUMIN 4.8 06/04/2022   CALCIUM 10.0 06/04/2022   ANIONGAP 9 01/22/2022   EGFR 117 06/04/2022   Rice Results  Component Value Date   CHOL 227 (H) 11/29/2021   CHOL 231 (H) 11/29/2021   Rice Results  Component Value Date   HDL 78 11/29/2021   HDL  81 11/29/2021   Rice Results  Component Value Date   LDLCALC 133 (H) 11/29/2021   LDLCALC 133 (H) 11/29/2021   Rice Results  Component Value Date   TRIG 92 11/29/2021   TRIG 96  11/29/2021   Rice Results  Component Value Date   CHOLHDL 2.9 11/29/2021   CHOLHDL 2.9 11/29/2021   Rice Results  Component Value Date   HGBA1C 5.6 11/29/2021   HGBA1C WILL FOLLOW 11/29/2021       Assessment & Plan:   Problem List Items Addressed This Visit       Immune and Lymphatic   Inguinal lymphadenopathy - Primary   Relevant Orders   CBC with Differential/Platelet (Completed)   Comprehensive metabolic panel (Completed) Repeat labs completed at patient request  Encouraged to continue monitoring for changes in BLE pain and swelling    Other Visit Diagnoses     Pain in both lower extremities       Relevant Medications   acetaminophen-codeine (TYLENOL #3) 300-30 MG tablet, initiated during visit   Other Relevant Orders   AMB referral to orthopedics   VAS [place] LOWER EXTREMITY VENOUS (DVT)   Primary hypertension       Relevant Medications   amLODipine (NORVASC) 10 MG tablet, refilled without change  Encouraged continued diet and exercise efforts  Encouraged continued compliance with medication     Follow up in 3 wks for reevaluation of BLE pain s/p LEEP, sooner as needed     I am having Barbara Rice start on acetaminophen-codeine. I am also having her maintain her docusate sodium, cyclobenzaprine, and amLODipine.  Meds ordered this encounter  Medications   amLODipine (NORVASC) 10 MG tablet    Sig: Take 1 tablet (10 mg total) by mouth daily.    Dispense:  30 tablet    Refill:  2   acetaminophen-codeine (TYLENOL #3) 300-30 MG tablet    Sig: Take 1 tablet by mouth every 4 (four) hours as needed for up to 14 days for moderate pain.    Dispense:  14 tablet    Refill:  0     Teena Dunk, NP

## 2022-06-04 NOTE — Patient Instructions (Signed)
You were seen today in the [facility] for leg pain and swelling. Labs were collected, results will be available via MyChart or, if abnormal, you will be contacted by clinic staff. You were prescribed medications, please take as directed. Please follow up in 3 mths for reevaluation of bilateral leg pain

## 2022-06-05 ENCOUNTER — Ambulatory Visit: Payer: 59 | Admitting: Occupational Therapy

## 2022-06-05 LAB — CBC WITH DIFFERENTIAL/PLATELET
Basophils Absolute: 0 10*3/uL (ref 0.0–0.2)
Basos: 1 %
EOS (ABSOLUTE): 0.1 10*3/uL (ref 0.0–0.4)
Eos: 2 %
Hematocrit: 37.1 % (ref 34.0–46.6)
Hemoglobin: 11.7 g/dL (ref 11.1–15.9)
Immature Grans (Abs): 0 10*3/uL (ref 0.0–0.1)
Immature Granulocytes: 0 %
Lymphocytes Absolute: 1.9 10*3/uL (ref 0.7–3.1)
Lymphs: 56 %
MCH: 27.8 pg (ref 26.6–33.0)
MCHC: 31.5 g/dL (ref 31.5–35.7)
MCV: 88 fL (ref 79–97)
Monocytes Absolute: 0.2 10*3/uL (ref 0.1–0.9)
Monocytes: 6 %
Neutrophils Absolute: 1.1 10*3/uL — ABNORMAL LOW (ref 1.4–7.0)
Neutrophils: 35 %
RBC: 4.21 x10E6/uL (ref 3.77–5.28)
RDW: 12.5 % (ref 11.7–15.4)
WBC: 3.3 10*3/uL — ABNORMAL LOW (ref 3.4–10.8)

## 2022-06-05 LAB — COMPREHENSIVE METABOLIC PANEL
ALT: 12 IU/L (ref 0–32)
AST: 22 IU/L (ref 0–40)
Albumin/Globulin Ratio: 1.8 (ref 1.2–2.2)
Albumin: 4.8 g/dL (ref 3.9–5.0)
Alkaline Phosphatase: 53 IU/L (ref 44–121)
BUN/Creatinine Ratio: 11 (ref 9–23)
BUN: 8 mg/dL (ref 6–20)
Bilirubin Total: 0.5 mg/dL (ref 0.0–1.2)
CO2: 22 mmol/L (ref 20–29)
Calcium: 10 mg/dL (ref 8.7–10.2)
Chloride: 102 mmol/L (ref 96–106)
Creatinine, Ser: 0.72 mg/dL (ref 0.57–1.00)
Globulin, Total: 2.7 g/dL (ref 1.5–4.5)
Glucose: 79 mg/dL (ref 70–99)
Potassium: 4.3 mmol/L (ref 3.5–5.2)
Sodium: 138 mmol/L (ref 134–144)
Total Protein: 7.5 g/dL (ref 6.0–8.5)
eGFR: 117 mL/min/{1.73_m2} (ref >59)

## 2022-06-09 ENCOUNTER — Ambulatory Visit: Payer: 59 | Admitting: Occupational Therapy

## 2022-06-10 ENCOUNTER — Ambulatory Visit (INDEPENDENT_AMBULATORY_CARE_PROVIDER_SITE_OTHER): Payer: 59

## 2022-06-10 ENCOUNTER — Ambulatory Visit: Payer: 59 | Admitting: Physician Assistant

## 2022-06-10 DIAGNOSIS — M79605 Pain in left leg: Secondary | ICD-10-CM | POA: Diagnosis not present

## 2022-06-10 DIAGNOSIS — M79604 Pain in right leg: Secondary | ICD-10-CM

## 2022-06-10 NOTE — Progress Notes (Signed)
Office Visit Note   Patient: Barbara Rice           Date of Birth: 30-Oct-1996           MRN: [id number] Visit Date: 06/10/2022              Requested by: Orion Crook I, NP [address], [place],  [place] [address] PCP: Orion Crook I, NP   Assessment & Plan: Visit Diagnoses:  1. Bilateral leg pain     Plan: Impression is bilateral lower extremity pain and swelling.  At this point, I think her symptoms are due more to lymphedema rather than true orthopedic etiology.  I will have her follow-up with her primary care provider for further evaluation treatment recommendation.  Follow-Up Instructions: Return if symptoms worsen or fail to improve.   Orders:  Orders Placed This Encounter  Procedures   XR Tibia/Fibula Right   XR Tibia/Fibula Left   No orders of the defined types were placed in this encounter.     Procedures: No procedures performed   Clinical Data: No additional findings.   Subjective: Chief Complaint  Patient presents with   Left Leg - Pain   Right Leg - Pain    HPI patient is a pleasant 26 year old female who comes in today with bilateral lower leg pain and swelling left greater than right.  She has been dealing with this for about 8 months.  She denies any injury or change in activity.  Symptoms appear to be worse while at work and as well as at the end of the day as she works as a Psychologist, sport and exercise.  She occasionally wears compression socks without significant relief.  She has been going to therapy where they have been doing wraps and pumps with some relief.  She underwent bilateral lower extremity ultrasound in the past without evidence of DVT.  It was noted at that time that she had a right inguinal lymph node which was aspirated.  She states that this was unremarkable.  Review of Systems as detailed in HPI.  All others reviewed and are negative.   Objective: Vital Signs: There were no vitals taken for this visit.  Physical Exam  well-developed well-nourished female no acute distress.  Alert and oriented x3.  Ortho Exam bilateral lower extremity exam reveals mild and diffuse tenderness throughout the entire aspect of both lower legs.  She is neurovascular intact distally.  Specialty Comments:  No specialty comments available.  Imaging: XR Tibia/Fibula Left  Result Date: 06/10/2022 No acute or structural abnormalities  XR Tibia/Fibula Right  Result Date: 06/10/2022 No acute or structural abnormalities    PMFS History: Patient Active Problem List   Diagnosis Date Noted   LGSIL on Pap smear of cervix 04/30/2022   Inguinal lymphadenopathy 01/26/2022   Past Medical History:  Diagnosis Date   Sciatic nerve pain     Family History  Problem Relation Age of Onset   Prostatitis Father    Hypertension Father    Cervical cancer Paternal Aunt    Ovarian cancer Paternal Aunt    Cancer Cousin     No past surgical history on file. Social History   Occupational History   Not on file  Tobacco Use   Smoking status: Never   Smokeless tobacco: Never  Vaping Use   Vaping Use: Never used  Substance and Sexual Activity   Alcohol use: Never   Drug use: Never   Sexual activity: Not Currently

## 2022-06-12 ENCOUNTER — Ambulatory Visit: Payer: 59 | Admitting: Occupational Therapy

## 2022-06-12 DIAGNOSIS — I890 Lymphedema, not elsewhere classified: Secondary | ICD-10-CM | POA: Diagnosis not present

## 2022-06-12 NOTE — Therapy (Signed)
[place] [facility] MAIN [facility] SERVICES [address] [place], [place], [address] Phone: [phone]   Fax:  [phone]  Occupational Therapy Treatment  Patient Details  Name: Barbara Rice MRN: [id number] Date of Birth: 12/29/1996 Referring Provider (OT): Orion Crook, NP   Encounter Date: 06/12/2022   OT End of Session - 06/12/22 1212     Visit Number 7    Number of Visits 36    Date for OT Re-Evaluation 06/29/22    Progress Note Due on Visit 10    OT Start Time 1010    OT Stop Time 1110    OT Time Calculation (min) 60 min    Activity Tolerance Patient tolerated treatment well;No increased pain    Behavior During Therapy WFL for tasks assessed/performed             Past Medical History:  Diagnosis Date   Sciatic nerve pain     No past surgical history on file.  There were no vitals filed for this visit.   Subjective Assessment - 06/12/22 1246     Subjective  "[name]" presents for OT Rx to address BLE lymphedema and associated pain. Pt does not rate LE related pain numerically, but states it is ongoing.  Pt does not bring compression wraps back to clinic today. She tells me she removed them      late on the night after her last session as they felt increasingly tight over time. Pt states she made an appointment with her podiatrist and is interested in consulting with a vascular doctor to check on status of her blood vessels and circulation.    Patient is accompanied by: --   Karrie Meres -[employer] rmanufacturer's rep   Pertinent History Insideous onset BLE swelling with associated pain 10/2021; denies known family hx of limb swelling; HTN (amlodipine!); hx sciatic nerve pain; S/p 02/20/22 US guided FNA of right inguinal lymph node w/ Inguinal lymphadenopathy per imaging; Subsequent cytology ruled out malignancy    Limitations Decreased standing and walking tolerance 2/2 chronnic bilateral leg swelling and associated pain;  hgait disturbance,    Repetition Increases Symptoms    Special Tests - Stemmer bilaterally. Intake FOTO score: 83/100    Patient Stated Goals My provider and I are trying to find out the cause of my swollen lymph nodes on both sides.  I hope this helps with pain management.    Pain Onset --   leg swelling and associated pain  onet 11/22                         OT Treatments/Exercises (OP) - 06/12/22 1247       ADLs   ADL Education Given Yes      Manual Therapy   Manual Therapy Edema management;Manual Lymphatic Drainage (MLD)    Manual Lymphatic Drainage (MLD) MLD to RLE/RLQ utilizing short neck sequence, deep abdominal breathing, functional inguinal LN, and proximal to distal J strokes from groin to foot, then back and ending with terminus. Goof tolerance.                    OT Education - 06/12/22 1248     Education Details Continued Pt edu for entire, non-cancer lower extremity sequence using functional inguinals.    Person(s) Educated Patient    Methods Explanation;Demonstration;Handout    Comprehension Verbalized understanding;Returned demonstration;Need further instruction  OT Long Term Goals - 04/01/22 1244       OT LONG TERM GOAL #1   Title Given this patient's Intake score of 83/100 on the functional outcomes FOTO tool, patient will experience an increase in function of 6 points to 89, to improve basic and instrumental ADLs performance, including lymphedema self-care.    Baseline 83/100    Time 12    Period Weeks    Status New    Target Date 06/29/22      OT LONG TERM GOAL #2   Title Pt will achieve at least a 10% limb volume reduction from ankle to groin bilaterally to return limb to more normal size and shape, to limit infection risk, to decrease pain, to improve function, and to limit lymphedema progression.    Baseline dependent    Time 12    Period Days    Status New    Target Date 06/29/22      OT LONG TERM  GOAL #3   Title Pt will achieve and sustain a least 85% compliance with all LE self-care home program components throughout OT for CDT, including daily skin inspection and care, lymphatic pumping ther ex, 23/7 compression wraps and simple self-MLD, to sustain clinical gains made in CDT and to limit lymphedema progression and further functional decline.    Baseline Max A    Time 12    Period Weeks    Status New    Target Date 06/29/22      OT LONG TERM GOAL #4   Title Pt will be able to donn/doff appropriate compression garments and devices and follow recommended wear and care schedule by DC for optimal LE self-management over time.    Baseline Max A    Time 12    Period Weeks    Status New    Target Date 06/29/22      OT LONG TERM GOAL #5   Title Pt will demonstrate understanding of lymphedema prevention strategies by identifying and discussing 5 precautions using printed reference (modified assistance) to reduce risk of progression and to limit infection risk.    Baseline Max A    Time 4    Period Days    Status New    Target Date --   4th OT Rx visit     Long Term Additional Goals   Additional Long Term Goals Yes                   Plan - 06/12/22 1235     Clinical Impression Statement Pt reports she purchased recommended compression stockings and she wears them to work. She states they help relieve leg pain, but it returns when she takes them off after work. Pt has not brought them in for assessment.  Provided Pt edu for simple self-MLD for LE sequence using functional inguinal LN throughout session wheile performing MLD to RLE/RLQ as established. . Pt was able to perform short neck sequence, diaphragmatic breathing, inguinal stationary strokes and proximal to distal J strokes with Min assist by end of session.Pt continues to have leg pain below the knees. 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 Suggested Pt explore venogram  with her PCP, or cvascular provider, as a possible diagnostic tool. Pt in agreement with plan to reduce Rx frequency to 1 x weekly as she rarely attends at recommended 2 x weekly frequency.    OT Occupational Profile and History Detailed Assessment- Review of Records  and additional review of physical, cognitive, psychosocial history related to current functional performance    Occupational performance deficits (Please refer to evaluation for details): ADL's;IADL's;Rest and Sleep;Work;Leisure;Social Participation;Other   body image; functional ambulation  and  mobility   Rehab Potential Good    Clinical Decision Making Limited treatment options, no task modification necessary    Comorbidities Affecting Occupational Performance: Presence of comorbidities impacting occupational performance    Comorbidities impacting occupational performance description: HTN    Modification or Assistance to Complete Evaluation  No modification of tasks or assist necessary to complete eval    OT Frequency 1x / week   1 x weekly for 6 weeks tinitially. We'll focus on fitting off-the-shelf compression thigh highs initially- considering ccl 2 (30-40 mmHg)  or 3 (40-50 mmHg) for 12 hour shifts on feet, and providing MLD.   OT Duration 6 weeks    OT Treatment/Interventions Self-care/ADL training;DME and/or AE instruction;Manual lymph drainage;Compression bandaging;Therapeutic activities;Coping strategies training;Therapeutic exercise;Other (comment);Manual Therapy;Energy conservation;Patient/family education    Plan Modified Intensive Phase CDT: 1 x weekly for 6 weeks tinitially. We'll focus on fitting off-the-shelf compression thigh highs , especially for working hours!- considering ccl 2 (30-40 mmHg)  or 3 (40-50 mmHg) . Will teach all LE self care skills, including simple self-MLD, skin care, ther ex and compression options based on activity demands. If no volumetric change will commence multilayer gradient compression wrapping  to LLE  and complete standard Intensive phase CDT.    Recommended Other Services Consider changing AmLODapine for alternative med since Norvasc has leg and ankle swelling as common side effect    Consulted and Agree with Plan of Care Patient             Patient will benefit from skilled therapeutic intervention in order to improve the following deficits and impairments:           Visit Diagnosis: Lymphedema, not elsewhere classified    Problem List Patient Active Problem List   Diagnosis Date Noted   LGSIL on Pap smear of cervix 04/30/2022   Inguinal lymphadenopathy 01/26/2022    Loel Dubonnet, MS, OTR/L, [facility] 06/12/22 12:50 PM    [place] [facility] MAIN [facility] SERVICES [address] [place], [place], [address] Phone: [phone]   Fax:  [phone]  Name: Barbara Rice MRN: [id number] Date of Birth: 1996-05-08

## 2022-06-12 NOTE — Patient Instructions (Signed)

## 2022-06-13 ENCOUNTER — Telehealth: Payer: Self-pay

## 2022-06-13 NOTE — Telephone Encounter (Signed)
No additional notes needed  

## 2022-06-16 ENCOUNTER — Ambulatory Visit: Payer: 59 | Admitting: Occupational Therapy

## 2022-06-17 ENCOUNTER — Ambulatory Visit (INDEPENDENT_AMBULATORY_CARE_PROVIDER_SITE_OTHER): Payer: 59 | Admitting: Podiatry

## 2022-06-17 DIAGNOSIS — M25571 Pain in right ankle and joints of right foot: Secondary | ICD-10-CM | POA: Diagnosis not present

## 2022-06-17 DIAGNOSIS — M6788 Other specified disorders of synovium and tendon, other site: Secondary | ICD-10-CM

## 2022-06-17 DIAGNOSIS — M76821 Posterior tibial tendinitis, right leg: Secondary | ICD-10-CM

## 2022-06-17 DIAGNOSIS — M2141 Flat foot [pes planus] (acquired), right foot: Secondary | ICD-10-CM

## 2022-06-17 DIAGNOSIS — M2142 Flat foot [pes planus] (acquired), left foot: Secondary | ICD-10-CM

## 2022-06-17 DIAGNOSIS — M76822 Posterior tibial tendinitis, left leg: Secondary | ICD-10-CM

## 2022-06-17 DIAGNOSIS — M21862 Other specified acquired deformities of left lower leg: Secondary | ICD-10-CM

## 2022-06-17 DIAGNOSIS — M21861 Other specified acquired deformities of right lower leg: Secondary | ICD-10-CM

## 2022-06-17 MED ORDER — MELOXICAM 15 MG PO TABS: 15.0000 mg | ORAL_TABLET | Freq: Every day | ORAL | 3 refills | Status: DC

## 2022-06-17 NOTE — Patient Instructions (Signed)
Call [facility] to schedule your MRI at the below locations.  Please allow at least 1 business day after your visit to process the referral.  It may take longer depending on approval from insurance.  Please let me know if you have issues or problems scheduling the MRI    [facility] [phone] [address] [place], [place] [address]   Call to schedule physical therapy: [facility] at [facility]  [phone]

## 2022-06-17 NOTE — Progress Notes (Signed)
  Subjective:  Patient ID: Barbara Rice, female    DOB: 1996-05-15,  MRN: [id number]  Chief Complaint  Patient presents with   Flat Foot      bilateral ankle, heel pain    26 y.o. female presents with the above complaint. History confirmed with patient.  Pain continues to be quite bothersome its along the inside of the ankle now on the outside of the ankle and into the arch.  She has been wearing the orthotic inserts and doing home exercises and these have not helped.  Her pain and is very significant by the end of the day  Objective:  Physical Exam: warm, good capillary refill, no trophic changes or ulcerative lesions, normal DP and PT pulses, normal sensory exam, and she has right worse than left pes planus deformity with collapse of the medial arch, pain on palpation of the plantar medial arch bilaterally especially at the insertion of the PT tendon on the navicular, she also has pain along the peroneal tendons with resisted eversion and inversion on the PT tendon  Radiographs: Previous radiographs taken November 2022 show significant pes planus deformity with collapse of the medial longitudinal arch and abduction of the forefoot Assessment:   1. Posterior tibial tendon dysfunction (PTTD) of both lower extremities   2. Pes planus of both feet   3. Sinus tarsitis, right   4. Peroneal tendinosis, unspecified laterality   5. Gastrocnemius equinus of left lower extremity   6. Gastrocnemius equinus of right lower extremity      Plan:  Patient was evaluated and treated and all questions answered.  Unfortunately has continued to worsen.  We discussed continued orthotic support beyond prefabricated orthoses including custom molded orthoses.  Her home therapy has not been helpful and I recommended formal physical therapy to work on this with her as well and this was ordered and referral sent.  She will call to schedule.  I do recommend MRIs of the bilateral ankles as she may require  eventual surgical intervention for her flatfoot deformity which is quite significant, the MRI is for surgical planning to evaluate the integrity of the PT tendon and peroneal tendons disease the most painful areas for her right now.  I will see her back in 6 weeks following the MRI and physical therapy.  In the interim I prescribed her meloxicam, OTC meds have not been helpful for her.  We discussed the risk benefits and potential side effects of meloxicam   Return in about 6 weeks (around 07/29/2022) for after MRI to review.

## 2022-06-18 ENCOUNTER — Other Ambulatory Visit (HOSPITAL_COMMUNITY)
Admission: RE | Admit: 2022-06-18 | Discharge: 2022-06-18 | Disposition: A | Discharge status: Home / Self Care | Payer: 59 | Source: Ambulatory Visit | Attending: Obstetrics and Gynecology | Admitting: Obstetrics and Gynecology

## 2022-06-18 ENCOUNTER — Encounter: Payer: Self-pay | Admitting: Obstetrics and Gynecology

## 2022-06-18 ENCOUNTER — Ambulatory Visit (INDEPENDENT_AMBULATORY_CARE_PROVIDER_SITE_OTHER): Payer: 59 | Admitting: Obstetrics and Gynecology

## 2022-06-18 VITALS — BP 130/80 | HR 80 | Wt 169.0 lb

## 2022-06-18 DIAGNOSIS — R87612 Low grade squamous intraepithelial lesion on cytologic smear of cervix (LGSIL): Secondary | ICD-10-CM | POA: Insufficient documentation

## 2022-06-18 NOTE — Progress Notes (Signed)
   GYNECOLOGY CLINIC PROCEDURE NOTE  Barbara Rice is a 26 y.o. No obstetric history on file. here for LEEP. No GYN concerns. Pap smear and colposcopy reviewed.    Pap ASCUS, ? HG 01/14/22 Colpo Biopsy CIN 2  4/23 ECC Negative  Risks, benefits, alternatives, and limitations of procedure explained to patient, including pain, bleeding, infection, failure to remove abnormal tissue and failure to cure dysplasia, need for repeat procedures, damage to pelvic organs, cervical incompetence.  Role of HPV,cervical dysplasia and need for close followup was empasized. Informed written consent was obtained. All questions were answered. Time out performed.  ??Procedure: The patient was placed in lithotomy position and the bivalved coated speculum was placed in the patient's vagina. A grounding pad placed on the patient. Lugol's solution was applied to the cervix and areas of decreased uptake were noted around the transformation zone.   Local anesthesia was administered via an intracervical block using 10cc of 2% Lidocaine with epinephrine. The suction was turned on and the Medium 1X Fisher Cone Biopsy Excisor on 50 Watts of cutting current was used to excise the area of decreased uptake and excise the entire transformation zone. Excellent hemostasis was achieved using roller ball coagulation set at 50 Watts coagulation current. Monsel's solution was then applied and the speculum was removed from the vagina. Specimens were sent to pathology.  ?The patient tolerated the procedure well. Post-operative instructions given to patient, including instruction to seek medical attention for persistent bright red bleeding, fever, abdominal/pelvic pain, dysuria, nausea or vomiting. She was also told about the possibility of having copious yellow to black tinged discharge for weeks. She was counseled to avoid anything in the vagina (sex/douching/tampons) for 3 weeks. She has a 4 week post-operative check to assess wound healing,  review results and discuss further management.   Nettie Elm, MD, FACOG Attending Obstetrician & Gynecologist Center for [facility], [facility]

## 2022-06-18 NOTE — Patient Instructions (Signed)
LEEP POST-PROCEDURE INSTRUCTIONS  You may take Ibuprofen, Aleve or Tylenol for pain if needed.  Cramping is normal.  You will have black and/or bloody discharge at first.  This will lighten and then turn clear before completely resolving.  This will take 2 to 3 weeks.  Put nothing in your vagina until the bleeding or discharge stops (usually 2 or3 days).  You need to call if you have redness around the biopsy site, if there is any unusual draining, if the bleeding is heavy, or if you are concerned.  Shower or bathe as normal  We will call you within one week with results or we will discuss the results at your follow-up appointment if needed.  You will need to return for a follow-up Pap smear as directed by your physician.  Loop Electrosurgical Excision Procedure Loop electrosurgical excision procedure (LEEP) is the cutting and removal (excision) of tissue from the cervix. The cervix is the bottom part of the uterus that opens into the vagina. The tissue that is removed from the cervix is examined to see if there are cancer cells or cells that might turn into cancer (precancerous cells). LEEP may be done when: You have abnormal bleeding from your cervix. You have an abnormal Pap test result. Your health care provider finds abnormalities on your cervix during an exam. LEEP typically only takes a few minutes and is often done in the health care provider's office. The procedure is safe for women who are trying to get pregnant. The procedure is usually not done during a menstrual period or during pregnancy. Tell a health care provider about: Any allergies you have. All medicines you are taking, including vitamins, herbs, eye drops, creams, and over-the-counter medicines. Any problems you or family members have had with anesthetic medicines. Any bleeding problems you have. Any medical conditions you have or have had. This includes current or past vaginal infections, such as herpes or STIs  (sexually transmitted infections). Whether you are pregnant or may be pregnant. If you are having vaginal bleeding on the day of the procedure. What are the risks? Generally, this is a safe procedure. However, problems may occur, including: Infection. Bleeding. Allergic reactions to medicines. Changes or scarring in the cervix. Damage to nearby structures or organs. Increased risk of early (preterm) labor in future pregnancies. What happens before the procedure? Ask your health care provider about: Changing or stopping your regular medicines. This is especially important if you are taking diabetes medicines or blood thinners. Taking medicines such as aspirin and ibuprofen. These medicines can thin your blood. Do not take these medicines unless your health care provider tells you to take them. Taking over-the-counter medicines, vitamins, herbs, and supplements. Your health care provider may recommend that you take pain medicine before the procedure. Ask your health care provider if you should plan to have a responsible adult take you home after the procedure. What happens during the procedure?  An instrument called a speculum will be placed in your vagina. This will allow your health care provider to see your cervix. You will be given a medicine to numb the area (local anesthetic). The medicine will be injected into your cervix and the surrounding area. A solution will be applied to your cervix. This solution will help the health care provider find the abnormal cells that need to be removed. A thin wire loop will be passed through your vagina to your cervix. The wire will remove layers of abnormal cervical cells. The wire will burn (cauterize)   the cervical tissue with an electrical current during cell removal. Open blood vessels will be cauterized to prevent bleeding. You might feel some pressure, aching, and cramping. If you feel like you will faint during the procedure, tell your health  care provider right away. A paste may be applied to the cauterized area of your cervix to help control bleeding. The sample of cervical tissue will be sent to a lab and looked at under a microscope. The procedure may vary among health care providers and hospitals. What can I expect after the procedure? After the procedure, it is common to have: Mild abdominal cramps that may last for up to 1 week. A small amount of pink-tinged or bloody vaginal discharge, including light to moderate bleeding, for 1-2 weeks. A brown- or black-colored discharge coming from your vagina, if a paste was used on the cervix to control bleeding. It is up to you to get the results of your procedure. Ask your health care provider, or the department that is doing the procedure, when your results will be ready. Follow these instructions at home: Take over-the-counter and prescription medicines only as told by your health care provider. Return to your normal activities as told by your health care provider. Ask your health care provider what activities are safe for you. Do not put anything in your vagina for 2 weeks after the procedure or until your health care provider says that it is okay. This includes tampons, creams, and douches. Do not have sex until your health care provider approves. Keep all follow-up visits. This is important. Contact a health care provider if: You have a fever or chills. You feel very weak. You have blood clots or bleeding that is heavier than a normal menstrual period. Bleeding that soaks a pad in less than 1 hour is considered heavy bleeding. You develop a bad-smelling discharge from your vagina. You have severe abdominal pain or cramping. Summary Loop electrosurgical excision procedure (LEEP) is the removal of tissue from the cervix. The removed tissue will be checked for precancerous cells or cancer cells. LEEP typically only takes a few minutes and is often done in your health care  provider's office. Do not put anything in your vagina for 2 weeks after the procedure or until your health care provider says that it is okay. This includes tampons, creams, and douches. Ask your health care provider, or the department that is doing the procedure, when your results will be ready. This information is not intended to replace advice given to you by your health care provider. Make sure you discuss any questions you have with your health care provider. Document Revised: 05/22/2021 Document Reviewed: 05/22/2021 Elsevier Patient Education  2023 Elsevier Inc.  

## 2022-06-19 ENCOUNTER — Ambulatory Visit: Payer: 59 | Admitting: Occupational Therapy

## 2022-06-19 ENCOUNTER — Encounter: Payer: Self-pay | Admitting: Occupational Therapy

## 2022-06-19 DIAGNOSIS — I890 Lymphedema, not elsewhere classified: Secondary | ICD-10-CM | POA: Diagnosis not present

## 2022-06-19 LAB — SURGICAL PATHOLOGY

## 2022-06-19 NOTE — Therapy (Signed)
OUTPATIENT OCCUPATIONAL THERAPY TREATMENT NOTE   Patient Name: Barbara Rice MRN: [id number] DOB:1996/12/29, 26 y.o., female Today's Date: 06/19/2022 REFERRING PROVIDER: Orion Crook, NP   OT End of Session - 06/19/22 1311     Visit Number 8    Number of Visits 36    Date for OT Re-Evaluation 06/29/22    Progress Note Due on Visit 10    OT Start Time 0905    OT Stop Time 1005    OT Time Calculation (min) 60 min    Activity Tolerance Patient tolerated treatment well;No increased pain    Behavior During Therapy WFL for tasks assessed/performed             Past Medical History:  Diagnosis Date   Sciatic nerve pain    No past surgical history on file. Patient Active Problem List   Diagnosis Date Noted   LGSIL on Pap smear of cervix 04/30/2022   Inguinal lymphadenopathy 01/26/2022    ONSET DATE: 03/31/22  REFERRING DIAG: I89.0 (ICD-10-CM) - Lymphedema  THERAPY DIAG:  Lymphedema, not elsewhere classified  Rationale for Evaluation and Treatment Rehabilitation  PERTINENT HISTORY: Insideous onset BLE swelling with associated pain 10/2021; denies known family hx of limb swelling; HTN (amlodipine!); hx sciatic nerve pain; S/p 02/20/22 US guided FNA of right inguinal lymph node w/ Inguinal lymphadenopathy per imaging; Subsequent cytology ruled out malignancy   LIMITATIONS:  Decreased standing and walking tolerance 2/2 chronic bilateral foot and leg swelling and associated pain; gait disturbance,   PRECAUTIONS: Lymphedema Precautions  SUBJECTIVE: Barbara Rice presents to OT for lymphedema treatment to BLE. Pt reports pain is ongoing. She saw podiatrist during interval , who recommended an MRI and possible surgical intervention. Pt endorses foot and distal leg pain, but does not rate numerically.   PAIN:  Are you having pain? Yes:  NPRS scale: /10- not numerically rated today "Pain location: distal legs and ankles, R>L "Pain description: sore, aching, tired, heavy,  full "Aggravating factors: standing, walking Relieving factors: elevation, MLD, compression     OBJECTIVE:   TODAY'S TREATMENT:  MLD to RLE/RLQ using    PATIENT EDUCATION: Education details: Continued Pt/ CG edu for lymphedema self care home program throughout session. Topics include outcome of comparative limb volumetrics- starting limb volume differentials (LVDs), technology and gradient techniques used for short stretch, multilayer compression wrapping, simple self-MLD, therapeutic lymphatic pumping exercises, skin/nail care, LE precautions,. compression garment recommendations and specifications, wear and care schedule and compression garment donning / doffing w assistive devices. Discussed progress towards all OT goals since commencing CDT. All questions answered to the Pt's satisfaction. Good return.  Person educated: Patient Education method: Medical illustrator Education comprehension: verbalized understanding and returned demonstration   HOME EXERCISE PROGRAM BLE lymphatic pumping There ex      OT Long Term Goals -      OT LONG TERM GOAL #1   Title Given this patient's Intake score of 83/100 on the functional outcomes FOTO tool, patient will experience an increase in function of 6 points to 89, to improve basic and instrumental ADLs performance, including lymphedema self-care.    Baseline 83/100    Time 12    Period Weeks    Status New    Target Date 06/29/22      OT LONG TERM GOAL #2   Title Pt will achieve at least a 10% limb volume reduction from ankle to groin bilaterally to return limb to more normal size and shape, to limit infection risk, to  decrease pain, to improve function, and to limit lymphedema progression.    Baseline dependent    Time 12    Period Days    Status New    Target Date 06/29/22      OT LONG TERM GOAL #3   Title Pt will achieve and sustain a least 85% compliance with all LE self-care home program components throughout OT for  CDT, including daily skin inspection and care, lymphatic pumping ther ex, 23/7 compression wraps and simple self-MLD, to sustain clinical gains made in CDT and to limit lymphedema progression and further functional decline.    Baseline Max A    Time 12    Period Weeks    Status New    Target Date 06/29/22      OT LONG TERM GOAL #4   Title Pt will be able to donn/doff appropriate compression garments and devices and follow recommended wear and care schedule by DC for optimal LE self-management over time.    Baseline Max A    Time 12    Period Weeks    Status New    Target Date 06/29/22      OT LONG TERM GOAL #5   Title Pt will demonstrate understanding of lymphedema prevention strategies by identifying and discussing 5 precautions using printed reference (modified assistance) to reduce risk of progression and to limit infection risk.    Baseline Max A    Time 4    Period Days    Status New    Target Date --   4th OT Rx visit     Long Term Additional Goals   Additional Long Term Goals Yes              Plan -     Clinical Impression Statement Pt tolerated RLE/RLQ well today without increased Leg pain. Pt does get an alagesis effect from MLD during session and then for several hours after manual therapy, but mild RLE swelling, boggy fibrosis and limb asymmetry persists. OT suggested Pt utilize a low pressure, Juzo anklet ( 15-18 mmHg) anklet over top of medical grade compression on the R when she is performing most physically strenuos activities that generte increased swelling. Increases in swelling and associated pain may be observed with reduced Rx frequency and onset of hot summer outdoor temperatures. OT used photograph of foot asymmetry as educational tool. Provided copy to Pt.  Pt and OT discussed plan going forward and agreed that we will further consider plan for DC after upcoming MRI results are available.    OT Occupational Profile and History Detailed Assessment- Review of  Records and additional review of physical, cognitive, psychosocial history related to current functional performance    Occupational performance deficits (Please refer to evaluation for details): ADL's;IADL's;Rest and Sleep;Work;Leisure;Social Participation;Other   body image; functional ambulation  and  mobility   Rehab Potential Good    Clinical Decision Making Limited treatment options, no task modification necessary    Comorbidities Affecting Occupational Performance: Presence of comorbidities impacting occupational performance    Comorbidities impacting occupational performance description: HTN    Modification or Assistance to Complete Evaluation  No modification of tasks or assist necessary to complete eval    OT Frequency 1x / week   1 x weekly for 6 weeks tinitially. We'll focus on fitting off-the-shelf compression thigh highs initially- considering ccl 2 (30-40 mmHg)  or 3 (40-50 mmHg) for 12 hour shifts on feet, and providing MLD.   OT Duration 6 weeks  OT Treatment/Interventions Self-care/ADL training;DME and/or AE instruction;Manual lymph drainage;Compression bandaging;Therapeutic activities;Coping strategies training;Therapeutic exercise;Other (comment);Manual Therapy;Energy conservation;Patient/family education    Plan Modified Intensive Phase CDT: 1 x weekly for 6 weeks tinitially. We'll focus on fitting off-the-shelf compression thigh highs , especially for working hours!- considering ccl 2 (30-40 mmHg)  or 3 (40-50 mmHg) . Will teach all LE self care skills, including simple self-MLD, skin care, ther ex and compression options based on activity demands. If no volumetric change will commence multilayer gradient compression wrapping to LLE  and complete standard Intensive phase CDT.    Recommended Other Services Consider changing AmLODapine for alternative med since Norvasc has leg and ankle swelling as common side effect    Consulted and Agree with Plan of Care Patient               Loel Dubonnet, Barbara, OTR/L, CLT-LANA 06/19/22 1:44 PM

## 2022-06-20 ENCOUNTER — Ambulatory Visit: Payer: 59 | Admitting: Nurse Practitioner

## 2022-06-24 ENCOUNTER — Encounter: Payer: Self-pay | Admitting: Nurse Practitioner

## 2022-06-24 ENCOUNTER — Ambulatory Visit (INDEPENDENT_AMBULATORY_CARE_PROVIDER_SITE_OTHER): Payer: 59 | Admitting: Nurse Practitioner

## 2022-06-24 VITALS — BP 128/79 | HR 85 | Temp 98.2°F | Ht 66.0 in | Wt 169.4 lb

## 2022-06-24 DIAGNOSIS — I10 Essential (primary) hypertension: Secondary | ICD-10-CM | POA: Diagnosis not present

## 2022-06-24 DIAGNOSIS — M7989 Other specified soft tissue disorders: Secondary | ICD-10-CM | POA: Diagnosis not present

## 2022-06-24 DIAGNOSIS — M79604 Pain in right leg: Secondary | ICD-10-CM

## 2022-06-24 DIAGNOSIS — M79605 Pain in left leg: Secondary | ICD-10-CM

## 2022-06-24 MED ORDER — MELOXICAM 15 MG PO TABS: 15.0000 mg | ORAL_TABLET | Freq: Every day | ORAL | 3 refills | Status: DC

## 2022-06-24 MED ORDER — AMLODIPINE BESYLATE 10 MG PO TABS: 10.0000 mg | ORAL_TABLET | Freq: Every day | ORAL | 2 refills | Status: DC

## 2022-06-24 NOTE — Progress Notes (Signed)
[place], [place]  [address] Phone:  [phone]   Fax:  [phone] Subjective:   Patient ID: Barbara Rice, female    DOB: 1996-07-28, 28 y.o.   MRN: [id number]  Chief Complaint  Patient presents with   Follow-up   HPI Barbara Rice 26 y.o. female \ has a past medical history of Sciatic nerve pain. To the [facility] for follow up visit.   Has completed follow up procedure with OB gyn, was informed of normal results. Swelling in BLE have resolved, but she continues to have intermittent pain. Pain is the most pronounced after standing for long periods or prolonged weight bearing. Completed visit with podiatrist and has additional screening scheduled. Podiatry suspects pain is related to problems with arch in foot. Denies any other concerns. Requesting extension of FMLA, while she is receiving additional screening and management by podiatry.   Denies any fatigue, chest pain, shortness of breath, HA or dizziness. Denies any blurred vision, numbness or tingling.  Past Medical History:  Diagnosis Date   Sciatic nerve pain     No past surgical history on file.  Family History  Problem Relation Age of Onset   Prostatitis Father    Hypertension Father    Cervical cancer Paternal Aunt    Ovarian cancer Paternal Aunt    Cancer Cousin     Social History   Socioeconomic History   Marital status: Married    Spouse name: Not on file   Number of children: Not on file   Years of education: Not on file   Highest education level: Not on file  Occupational History   Not on file  Tobacco Use   Smoking status: Never   Smokeless tobacco: Never  Vaping Use   Vaping Use: Never used  Substance and Sexual Activity   Alcohol use: Never   Drug use: Never   Sexual activity: Not Currently  Other Topics Concern   Not on file  Social History Narrative   Not on file   Social Determinants of Health   Financial Resource Strain: Not on  file  Food Insecurity: Not on file  Transportation Needs: Not on file  Physical Activity: Not on file  Stress: Not on file  Social Connections: Not on file  Intimate Partner Violence: Not on file    Outpatient Medications Prior to Visit  Medication Sig Dispense Refill   cyclobenzaprine (FLEXERIL) 5 MG tablet Take 1 tablet (5 mg total) by mouth 3 (three) times daily as needed for muscle spasms. 20 tablet 0   Sodium Fluoride 0.55 (0.25 F) MG/DROP SOLN Take by mouth.     amLODipine (NORVASC) 10 MG tablet Take 1 tablet (10 mg total) by mouth daily. 30 tablet 2   meloxicam (MOBIC) 15 MG tablet Take 1 tablet (15 mg total) by mouth daily. 30 tablet 3   No facility-administered medications prior to visit.    No Known Allergies  Review of Systems  Constitutional:  Negative for chills, fever and malaise/fatigue.  Respiratory:  Negative for cough and shortness of breath.   Cardiovascular:  Negative for chest pain, palpitations and leg swelling.  Gastrointestinal:  Negative for abdominal pain, blood in stool, constipation, diarrhea, nausea and vomiting.  Musculoskeletal:  Negative for back pain, falls, joint pain, myalgias and neck pain.       See HPI  Skin: Negative.   Neurological: Negative.   Psychiatric/Behavioral:  Negative for depression. The patient is not nervous/anxious.  All other systems reviewed and are negative.      Objective:    Physical Exam Vitals reviewed.  Constitutional:      General: She is not in acute distress.    Appearance: Normal appearance. She is normal weight.  HENT:     Head: Normocephalic.  Neck:     Vascular: No carotid bruit.  Cardiovascular:     Rate and Rhythm: Normal rate and regular rhythm.     Pulses: Normal pulses.     Heart sounds: Normal heart sounds.     Comments: No obvious peripheral edema Pulmonary:     Effort: Pulmonary effort is normal.     Breath sounds: Normal breath sounds.  Musculoskeletal:        General: No swelling,  tenderness, deformity or signs of injury. Normal range of motion.     Cervical back: Normal range of motion and neck supple. No rigidity or tenderness.     Right lower leg: No edema.     Left lower leg: No edema.  Lymphadenopathy:     Cervical: No cervical adenopathy.  Skin:    General: Skin is warm and dry.     Capillary Refill: Capillary refill takes less than 2 seconds.  Neurological:     General: No focal deficit present.     Mental Status: She is alert and oriented to person, place, and time.  Psychiatric:        Mood and Affect: Mood normal.        Behavior: Behavior normal.        Thought Content: Thought content normal.        Judgment: Judgment normal.     BP 128/79 (BP Location: Right Arm, Patient Position: Sitting, Cuff Size: Normal)   Pulse 85   Temp 98.2 F (36.8 C) (Temporal)   Ht 5' 6"  (1.676 m)   Wt 169 lb 6.4 oz (76.8 kg)   LMP 05/30/2022   SpO2 100%   BMI 27.34 kg/m  Wt Readings from Last 3 Encounters:  06/24/22 169 lb 6.4 oz (76.8 kg)  06/18/22 169 lb (76.7 kg)  06/04/22 172 lb 6.4 oz (78.2 kg)    Immunization History  Administered Date(s) Administered   Influenza-Unspecified 10/09/2021    Diabetic Foot Exam - Simple   No data filed     Rice Results  Component Value Date   TSH 0.466 11/29/2021   Rice Results  Component Value Date   WBC 3.3 (L) 06/04/2022   HGB 11.7 06/04/2022   HCT 37.1 06/04/2022   MCV 88 06/04/2022   PLT CANCELED 06/04/2022   Rice Results  Component Value Date   NA 138 06/04/2022   K 4.3 06/04/2022   CO2 22 06/04/2022   GLUCOSE 79 06/04/2022   BUN 8 06/04/2022   CREATININE 0.72 06/04/2022   BILITOT 0.5 06/04/2022   ALKPHOS 53 06/04/2022   AST 22 06/04/2022   ALT 12 06/04/2022   PROT 7.5 06/04/2022   ALBUMIN 4.8 06/04/2022   CALCIUM 10.0 06/04/2022   ANIONGAP 9 01/22/2022   EGFR 117 06/04/2022   Rice Results  Component Value Date   CHOL 227 (H) 11/29/2021   CHOL 231 (H) 11/29/2021   Rice Results   Component Value Date   HDL 78 11/29/2021   HDL 81 11/29/2021   Rice Results  Component Value Date   LDLCALC 133 (H) 11/29/2021   LDLCALC 133 (H) 11/29/2021   Rice Results  Component Value Date   TRIG 92  11/29/2021   TRIG 96 11/29/2021   Rice Results  Component Value Date   CHOLHDL 2.9 11/29/2021   CHOLHDL 2.9 11/29/2021   Rice Results  Component Value Date   HGBA1C 5.6 11/29/2021   HGBA1C WILL FOLLOW 11/29/2021       Assessment & Plan:   Problem List Items Addressed This Visit   None Visit Diagnoses     Leg swelling    -  Primary   Pain in both lower extremities       Relevant Medications   meloxicam (MOBIC) 15 MG tablet, refilled without change Discussed non pharmacological methods for management of symptoms Informed to take OTC medications as needed Encouraged to maintain treatment plan established by podiatry    Primary hypertension       Relevant Medications   amLODipine (NORVASC) 10 MG tablet, refilled without change Encouraged continued diet and exercise efforts  Encouraged continued compliance with medication     Follow up in 6 mths for wellness visit, sooner as needed     I am having Barbara Rice maintain her cyclobenzaprine, Sodium Fluoride, meloxicam, and amLODipine.  Meds ordered this encounter  Medications   meloxicam (MOBIC) 15 MG tablet    Sig: Take 1 tablet (15 mg total) by mouth daily.    Dispense:  30 tablet    Refill:  3   amLODipine (NORVASC) 10 MG tablet    Sig: Take 1 tablet (10 mg total) by mouth daily.    Dispense:  30 tablet    Refill:  2     Teena Dunk, NP

## 2022-07-03 ENCOUNTER — Telehealth: Payer: Self-pay | Admitting: Podiatry

## 2022-07-03 NOTE — Telephone Encounter (Signed)
Pt states she received a call from [facility] and they do not accept her insurance ([employer]) to have the MRI. Pt wants to know if there is another location that accepts her insurance for the MRI?  Please advise.

## 2022-07-07 NOTE — Addendum Note (Signed)
Addended byLilian Kapur, Kaycee Haycraft R on: 07/07/2022 01:15 PM   Modules accepted: Orders

## 2022-07-10 ENCOUNTER — Telehealth: Payer: Self-pay | Admitting: *Deleted

## 2022-07-10 ENCOUNTER — Telehealth: Payer: Self-pay

## 2022-07-10 NOTE — Telephone Encounter (Signed)
Barbara Rice from [facility] Barbara Rice reached out due to the patient needing a prior authorization for her MRI of the right and left ankle. I advised Barbara Rice that I have been dealing with the patients insurance in the past at another location and trying to get her insurance to approve the imaging's that she is needing done but they have been denying the services. I also advised Barbara Rice that I would work on trying to get this MRI approved ordered by Dr. Lilian Rice, but I couldn't promise that the patients insurance would approve it. I will contact Barbara Rice as soon as I get a response back from the patients insurance [employer]. The Prior Authorization process with [employer] will take 3 to 5 business days to be completed on their end.   PA has been started and faxed over to [employer] for patients MRI ankle right and left w/o contrast on 07/10/22.

## 2022-07-10 NOTE — Telephone Encounter (Signed)
[employer] decision:approved #[id number] Approval date:07/10/22 Effective date:07/10/22-10/10/22 Order:MRI lower Ext. Joint w w/o  Imaging @ Barbara Rice has been updated, will contact patient to schedule.

## 2022-07-20 IMAGING — US US PELVIS LIMITED
1 series · 14 of 25 positions shown · non-contrast
Comparison: None are available for review.

CLINICAL DATA: Enlarged lymph nodes in the right groin.

EXAM:
US PELVIS LIMITED
TECHNIQUE: Grayscale and color flow imaging of the right groin was performed.

[Series 1: us pelvis limited · 14 of 33 slices shown]
[im 1/33]
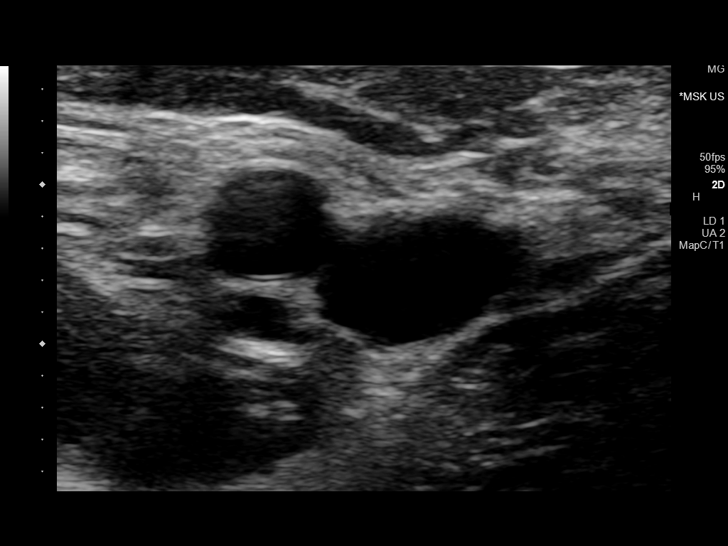
[im 3/33]
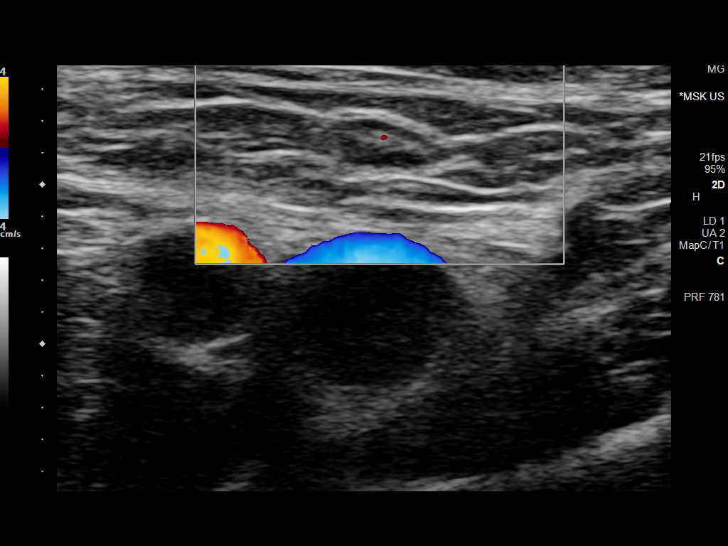
[im 6/33]
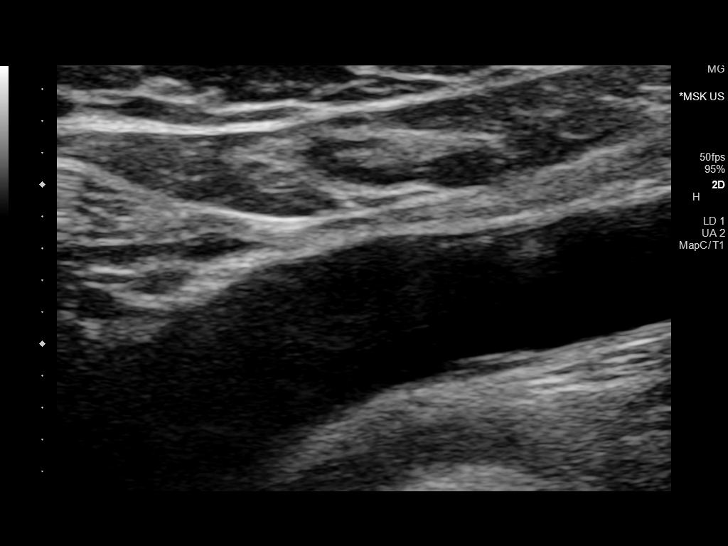
[im 9/33]
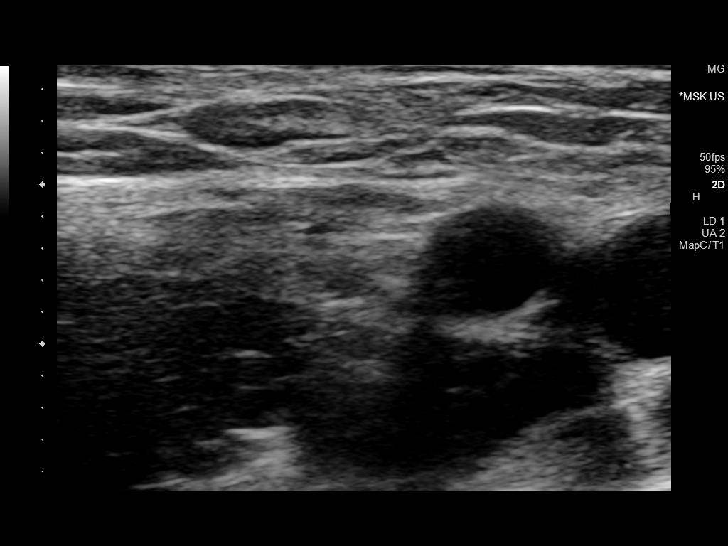
[im 11/33]
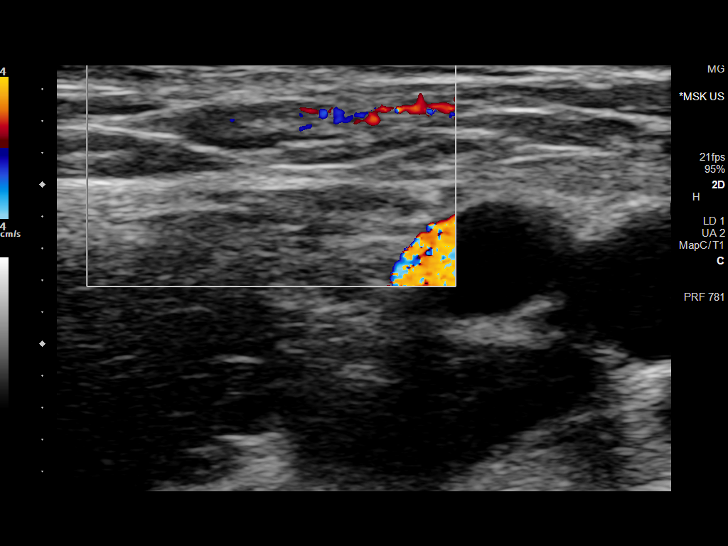
[im 13/33]
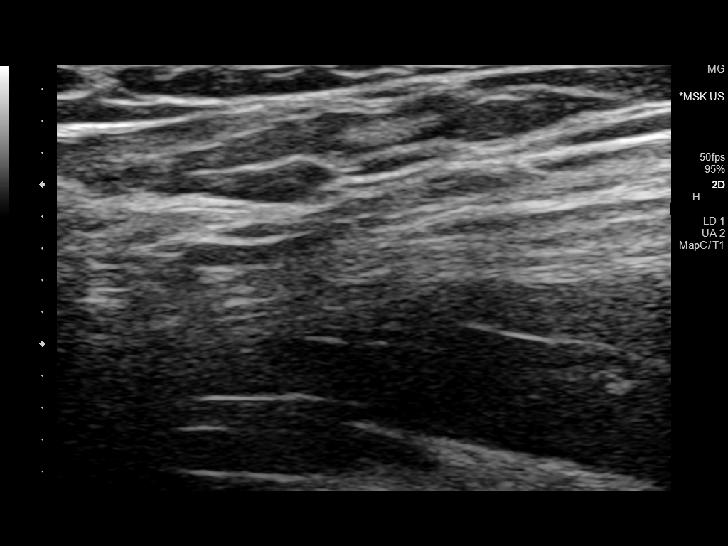
[im 15/33]
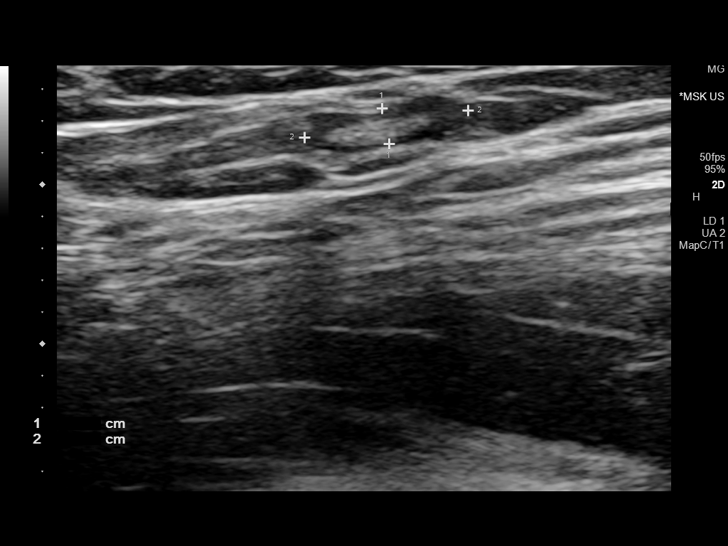
[im 18/33]
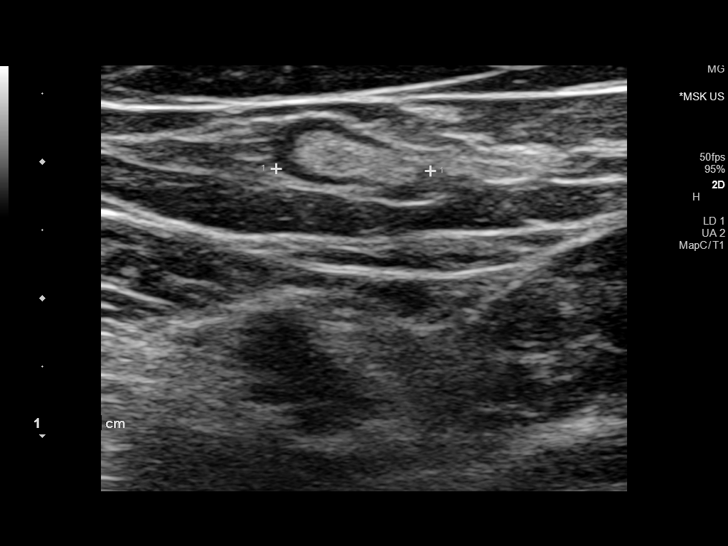
[im 21/33]
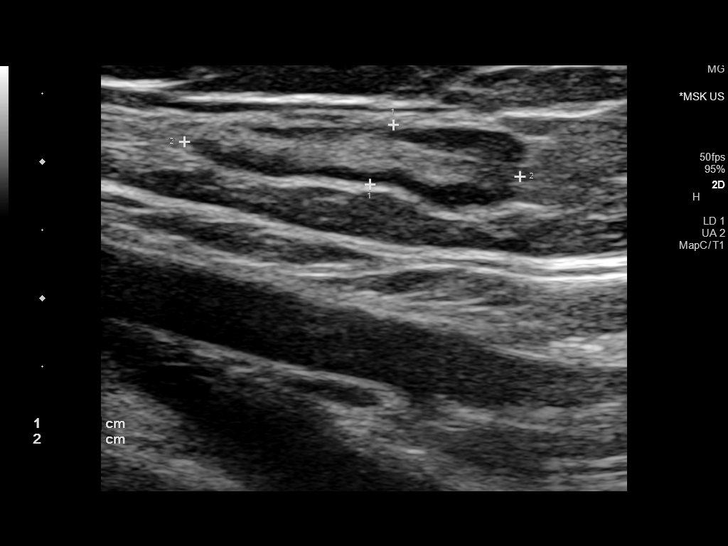
[im 22/33]
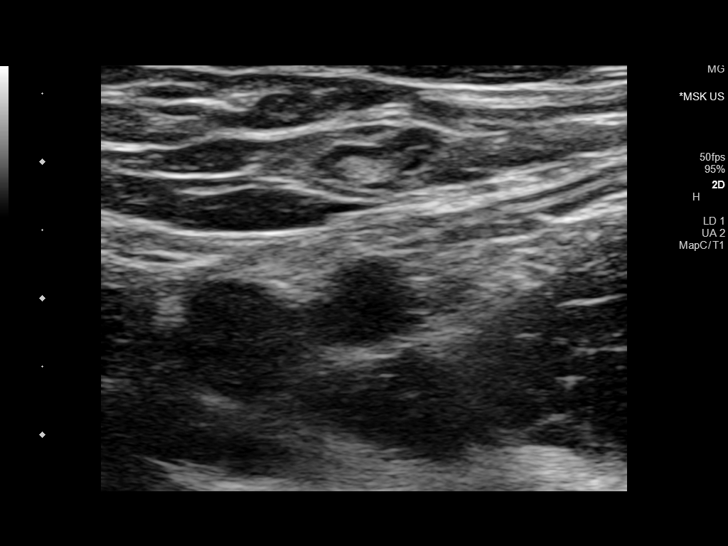
[im 25/33]
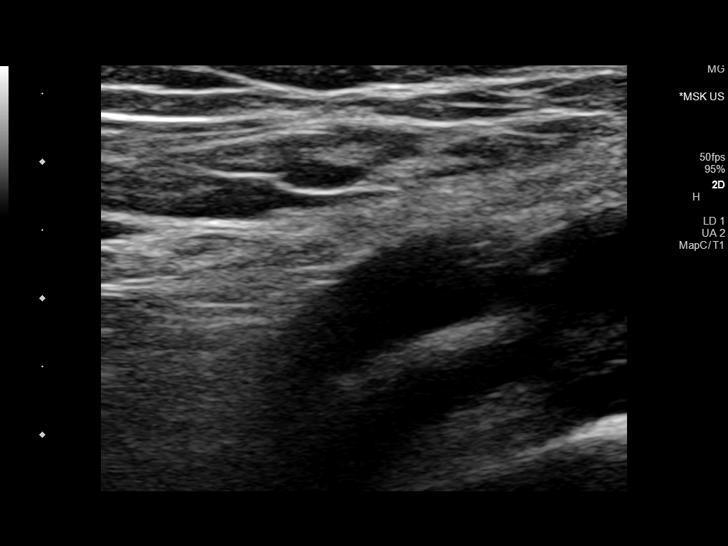
[im 27/33]
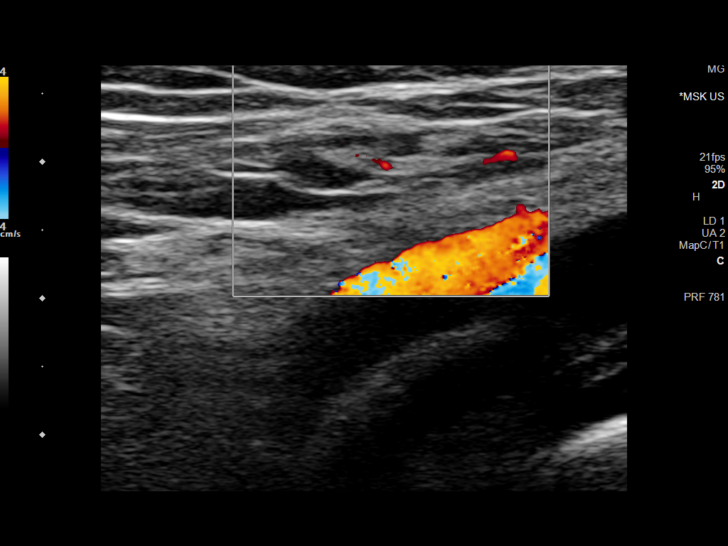
[im 30/33]
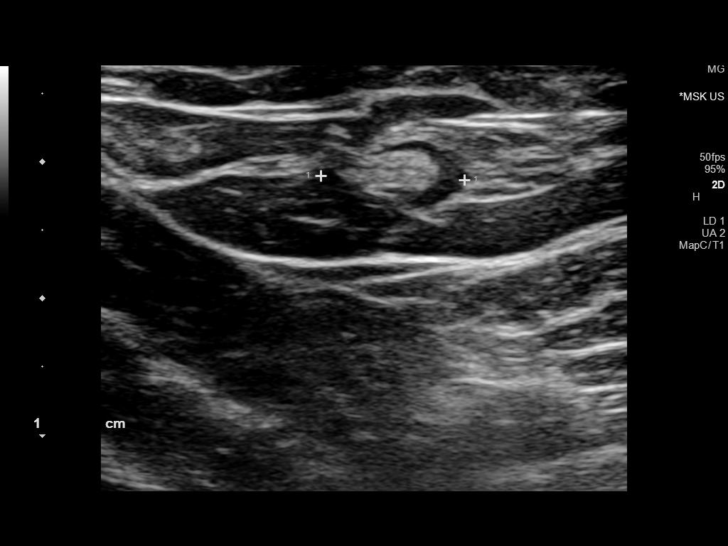
[im 33/33]
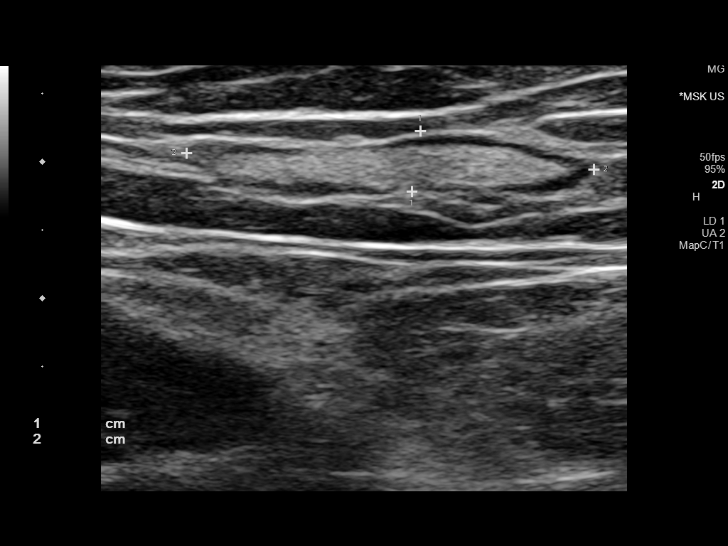

[14 of 25 positions shown; findings below may reference images not displayed]

FINDINGS: A few lymph nodes are noted in the right groin measuring up to 2.5 x
0.5 x 1.1 cm. Similar-appearing lymph nodes are present in the left
groin. No significant cortical thickening. No abnormal vascularity
is identified.
IMPRESSION: Lymph nodes in the inguinal regions bilaterally with no significant
abnormal morphology.

## 2022-07-22 ENCOUNTER — Ambulatory Visit (INDEPENDENT_AMBULATORY_CARE_PROVIDER_SITE_OTHER): Payer: 59

## 2022-07-22 DIAGNOSIS — M76821 Posterior tibial tendinitis, right leg: Secondary | ICD-10-CM

## 2022-07-22 DIAGNOSIS — M2142 Flat foot [pes planus] (acquired), left foot: Secondary | ICD-10-CM | POA: Diagnosis not present

## 2022-07-22 DIAGNOSIS — M25571 Pain in right ankle and joints of right foot: Secondary | ICD-10-CM | POA: Diagnosis not present

## 2022-07-22 DIAGNOSIS — M6788 Other specified disorders of synovium and tendon, other site: Secondary | ICD-10-CM

## 2022-07-22 DIAGNOSIS — M76822 Posterior tibial tendinitis, left leg: Secondary | ICD-10-CM

## 2022-07-22 DIAGNOSIS — M2141 Flat foot [pes planus] (acquired), right foot: Secondary | ICD-10-CM

## 2022-07-24 ENCOUNTER — Observation Stay (HOSPITAL_COMMUNITY)
Admit: 2022-07-24 | Discharge: 2022-07-24 | Disposition: A | Discharge status: Home / Self Care | Payer: 59 | Attending: Emergency Medicine | Admitting: Emergency Medicine

## 2022-07-24 ENCOUNTER — Emergency Department (HOSPITAL_COMMUNITY): Payer: 59

## 2022-07-24 ENCOUNTER — Observation Stay (HOSPITAL_BASED_OUTPATIENT_CLINIC_OR_DEPARTMENT_OTHER): Payer: 59

## 2022-07-24 ENCOUNTER — Inpatient Hospital Stay (HOSPITAL_COMMUNITY)
Admission: EM | Admit: 2022-07-24 | Discharge: 2022-07-29 | DRG: 882 | Disposition: A | Discharge status: Home / Self Care | Payer: 59 | Attending: Family Medicine | Admitting: Family Medicine

## 2022-07-24 ENCOUNTER — Encounter (HOSPITAL_COMMUNITY): Payer: Self-pay | Admitting: Radiology

## 2022-07-24 ENCOUNTER — Other Ambulatory Visit: Payer: Self-pay

## 2022-07-24 DIAGNOSIS — F458 Other somatoform disorders: Principal | ICD-10-CM | POA: Diagnosis present

## 2022-07-24 DIAGNOSIS — R42 Dizziness and giddiness: Secondary | ICD-10-CM | POA: Diagnosis present

## 2022-07-24 DIAGNOSIS — Z91014 Allergy to mammalian meats: Secondary | ICD-10-CM

## 2022-07-24 DIAGNOSIS — M25472 Effusion, left ankle: Secondary | ICD-10-CM | POA: Diagnosis present

## 2022-07-24 DIAGNOSIS — Z20822 Contact with and (suspected) exposure to covid-19: Secondary | ICD-10-CM | POA: Diagnosis present

## 2022-07-24 DIAGNOSIS — R55 Syncope and collapse: Secondary | ICD-10-CM

## 2022-07-24 DIAGNOSIS — R4020 Unspecified coma: Secondary | ICD-10-CM | POA: Diagnosis present

## 2022-07-24 DIAGNOSIS — I10 Essential (primary) hypertension: Secondary | ICD-10-CM | POA: Diagnosis present

## 2022-07-24 DIAGNOSIS — Z8249 Family history of ischemic heart disease and other diseases of the circulatory system: Secondary | ICD-10-CM

## 2022-07-24 DIAGNOSIS — F410 Panic disorder [episodic paroxysmal anxiety] without agoraphobia: Secondary | ICD-10-CM | POA: Diagnosis present

## 2022-07-24 DIAGNOSIS — Z8049 Family history of malignant neoplasm of other genital organs: Secondary | ICD-10-CM

## 2022-07-24 DIAGNOSIS — R569 Unspecified convulsions: Secondary | ICD-10-CM

## 2022-07-24 DIAGNOSIS — M25471 Effusion, right ankle: Secondary | ICD-10-CM | POA: Diagnosis present

## 2022-07-24 DIAGNOSIS — R002 Palpitations: Secondary | ICD-10-CM | POA: Diagnosis present

## 2022-07-24 DIAGNOSIS — R590 Localized enlarged lymph nodes: Secondary | ICD-10-CM | POA: Diagnosis present

## 2022-07-24 DIAGNOSIS — R000 Tachycardia, unspecified: Secondary | ICD-10-CM | POA: Diagnosis present

## 2022-07-24 DIAGNOSIS — Z79899 Other long term (current) drug therapy: Secondary | ICD-10-CM

## 2022-07-24 DIAGNOSIS — R064 Hyperventilation: Secondary | ICD-10-CM | POA: Diagnosis present

## 2022-07-24 DIAGNOSIS — Z791 Long term (current) use of non-steroidal anti-inflammatories (NSAID): Secondary | ICD-10-CM

## 2022-07-24 DIAGNOSIS — M7989 Other specified soft tissue disorders: Secondary | ICD-10-CM | POA: Diagnosis present

## 2022-07-24 DIAGNOSIS — D509 Iron deficiency anemia, unspecified: Secondary | ICD-10-CM | POA: Diagnosis present

## 2022-07-24 DIAGNOSIS — D649 Anemia, unspecified: Secondary | ICD-10-CM

## 2022-07-24 DIAGNOSIS — I890 Lymphedema, not elsewhere classified: Secondary | ICD-10-CM | POA: Diagnosis present

## 2022-07-24 DIAGNOSIS — D72819 Decreased white blood cell count, unspecified: Secondary | ICD-10-CM | POA: Diagnosis present

## 2022-07-24 DIAGNOSIS — Z8041 Family history of malignant neoplasm of ovary: Secondary | ICD-10-CM

## 2022-07-24 DIAGNOSIS — R739 Hyperglycemia, unspecified: Secondary | ICD-10-CM | POA: Diagnosis present

## 2022-07-24 LAB — TROPONIN I (HIGH SENSITIVITY)
Troponin I (High Sensitivity): 2 ng/L (ref <18)
Troponin I (High Sensitivity): 3 ng/L (ref <18)

## 2022-07-24 LAB — CBC WITH DIFFERENTIAL/PLATELET
Abs Immature Granulocytes: 0.01 10*3/uL (ref 0.00–0.07)
Basophils Absolute: 0 10*3/uL (ref 0.0–0.1)
Basophils Relative: 1 %
Eosinophils Absolute: 0.1 10*3/uL (ref 0.0–0.5)
Eosinophils Relative: 2 %
HCT: 37.2 % (ref 36.0–46.0)
Hemoglobin: 12 g/dL (ref 12.0–15.0)
Immature Granulocytes: 0 %
Lymphocytes Relative: 56 %
Lymphs Abs: 2.6 10*3/uL (ref 0.7–4.0)
MCH: 28.3 pg (ref 26.0–34.0)
MCHC: 32.3 g/dL (ref 30.0–36.0)
MCV: 87.7 fL (ref 80.0–100.0)
Monocytes Absolute: 0.4 10*3/uL (ref 0.1–1.0)
Monocytes Relative: 8 %
Neutro Abs: 1.5 10*3/uL — ABNORMAL LOW (ref 1.7–7.7)
Neutrophils Relative %: 33 %
Platelets: 211 10*3/uL (ref 150–400)
RBC: 4.24 MIL/uL (ref 3.87–5.11)
RDW: 12.7 % (ref 11.5–15.5)
WBC: 4.6 10*3/uL (ref 4.0–10.5)
nRBC: 0 % (ref 0.0–0.2)

## 2022-07-24 LAB — I-STAT BETA HCG BLOOD, ED (MC, WL, AP ONLY): I-stat hCG, quantitative: <5 m[IU]/mL (ref <5)

## 2022-07-24 LAB — COMPREHENSIVE METABOLIC PANEL
ALT: 15 U/L (ref 0–44)
AST: 24 U/L (ref 15–41)
Albumin: 4.4 g/dL (ref 3.5–5.0)
Alkaline Phosphatase: 42 U/L (ref 38–126)
Anion gap: 7 (ref 5–15)
BUN: 10 mg/dL (ref 6–20)
CO2: 24 mmol/L (ref 22–32)
Calcium: 10 mg/dL (ref 8.9–10.3)
Chloride: 108 mmol/L (ref 98–111)
Creatinine, Ser: 0.7 mg/dL (ref 0.44–1.00)
GFR, Estimated: >60 mL/min (ref >60)
Glucose, Bld: 112 mg/dL — ABNORMAL HIGH (ref 70–99)
Potassium: 3.9 mmol/L (ref 3.5–5.1)
Sodium: 139 mmol/L (ref 135–145)
Total Bilirubin: 0.5 mg/dL (ref 0.3–1.2)
Total Protein: 7.8 g/dL (ref 6.5–8.1)

## 2022-07-24 LAB — ECHOCARDIOGRAM COMPLETE
Area-P 1/2: 4.21 cm2
Calc EF: 63.4 %
S' Lateral: 2.6 cm
Single Plane A2C EF: 63.7 %
Single Plane A4C EF: 62.2 %

## 2022-07-24 LAB — CBG MONITORING, ED: Glucose-Capillary: 87 mg/dL (ref 70–99)

## 2022-07-24 LAB — D-DIMER, QUANTITATIVE: D-Dimer, Quant: 0.56 ug/mL-FEU — ABNORMAL HIGH (ref 0.00–0.50)

## 2022-07-24 LAB — LIPASE, BLOOD: Lipase: 34 U/L (ref 11–51)

## 2022-07-24 LAB — SARS CORONAVIRUS 2 BY RT PCR: SARS Coronavirus 2 by RT PCR: NEGATIVE

## 2022-07-24 LAB — GLUCOSE, CAPILLARY: Glucose-Capillary: 94 mg/dL (ref 70–99)

## 2022-07-24 MED ORDER — ENOXAPARIN SODIUM 40 MG/0.4ML IJ SOSY: 40.0000 mg | PREFILLED_SYRINGE | INTRAMUSCULAR | Status: DC

## 2022-07-24 MED ORDER — ONDANSETRON HCL 4 MG/2ML IJ SOLN
4.0000 mg | Freq: Four times a day (QID) | INTRAMUSCULAR | Status: DC | PRN
  Administered 2022-07-25: 4 mg via INTRAVENOUS
  Filled 2022-07-24: qty 2

## 2022-07-24 MED ORDER — SODIUM CHLORIDE 0.9 % IV BOLUS
1000.0000 mL | Freq: Once | INTRAVENOUS | Status: AC
  Administered 2022-07-24: 1000 mL via INTRAVENOUS

## 2022-07-24 MED ORDER — LORAZEPAM 2 MG/ML IJ SOLN
1.0000 mg | INTRAMUSCULAR | Status: DC | PRN
  Administered 2022-07-25: 1 mg via INTRAVENOUS
  Filled 2022-07-24: qty 1

## 2022-07-24 MED ORDER — ONDANSETRON HCL 4 MG PO TABS: 4.0000 mg | ORAL_TABLET | Freq: Four times a day (QID) | ORAL | Status: DC | PRN

## 2022-07-24 MED ORDER — SODIUM CHLORIDE (PF) 0.9 % IJ SOLN
INTRAMUSCULAR | Status: AC
  Filled 2022-07-24: qty 50

## 2022-07-24 MED ORDER — IOHEXOL 350 MG/ML SOLN
75.0000 mL | Freq: Once | INTRAVENOUS | Status: AC | PRN
  Administered 2022-07-24: 75 mL via INTRAVENOUS

## 2022-07-24 MED ORDER — SODIUM CHLORIDE 0.9 % IV SOLN: INTRAVENOUS | Status: DC

## 2022-07-24 MED ORDER — ACETAMINOPHEN 325 MG PO TABS
650.0000 mg | ORAL_TABLET | Freq: Four times a day (QID) | ORAL | Status: DC | PRN
  Administered 2022-07-26: 650 mg via ORAL
  Filled 2022-07-24: qty 2

## 2022-07-24 MED ORDER — LORAZEPAM 2 MG/ML IJ SOLN
1.0000 mg | Freq: Once | INTRAMUSCULAR | Status: AC
  Administered 2022-07-24: 1 mg via INTRAVENOUS
  Filled 2022-07-24: qty 1

## 2022-07-24 MED ORDER — SODIUM CHLORIDE 0.9% FLUSH
3.0000 mL | Freq: Two times a day (BID) | INTRAVENOUS | Status: DC
Start: 2022-07-24 — End: 2022-07-29
  Administered 2022-07-25 – 2022-07-27 (×7): 3 mL via INTRAVENOUS

## 2022-07-24 MED ORDER — LORAZEPAM 2 MG/ML IJ SOLN
1.0000 mg | Freq: Once | INTRAMUSCULAR | Status: AC
  Administered 2022-07-24: 1 mg via INTRAVENOUS

## 2022-07-24 MED ORDER — ACETAMINOPHEN 650 MG RE SUPP: 650.0000 mg | Freq: Four times a day (QID) | RECTAL | Status: DC | PRN

## 2022-07-24 NOTE — Progress Notes (Signed)
  Echocardiogram 2D Echocardiogram has been performed.  Barbara Rice 07/24/2022, 3:13 PM

## 2022-07-24 NOTE — ED Provider Notes (Signed)
[facility]-EMERGENCY DEPT Provider Note   CSN: [id number] Arrival date & time: 07/24/22  8756     History  Chief Complaint  Patient presents with   Loss of Consciousness    Barbara Rice is a 26 y.o. female.  Patient arrives from nursing unit upstairs after syncopal type event.  Patient with history of lymphedema, sciatic nerve pain who presents after change in her mental status.  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  Nursing staff states that patient started to have some palpitations, became tearful and feeling lightheaded.  She had not had much to eat or drink this morning but when staff went to go get her something to drink they came back and she was starting to shake more.  They are able to help get her to the floor.  She started to not respond to questions and having shaking all over.  No one knows of any history of seizures.  She has not had any recent illness per their knowledge.  She has been dealing with some lymphedema in her legs for a long time.  They did not think she had any recent surgery or travel.  Not aware of any stress.  Overall level 5 caveat as patient does not respond to questions or commands.  The history is provided by a friend.       Home Medications Prior to Admission medications   Medication Sig Start Date End Date Taking? Authorizing Provider  amLODipine (NORVASC) 10 MG tablet Take 1 tablet (10 mg total) by mouth daily. 06/24/22 09/22/22  Orion Crook I, NP  cyclobenzaprine (FLEXERIL) 5 MG tablet Take 1 tablet (5 mg total) by mouth 3 (three) times daily as needed for muscle spasms. 04/21/22   Passmore, Enid Derry I, NP  meloxicam (MOBIC) 15 MG tablet Take 1 tablet (15 mg total) by mouth daily. 06/24/22   Orion Crook I, NP  Sodium Fluoride 0.55 (0.25 F) MG/DROP SOLN Take by mouth.    [provider]      Allergies    Patient has no known allergies.    Review of Systems    Review of Systems  Physical Exam Updated Vital Signs BP 130/82   Pulse 98   Temp 98.5 F (36.9 C) (Axillary)   Resp (!) 23   SpO2 100%  Physical Exam Vitals and nursing note reviewed.  Constitutional:      General: She is in acute distress.     Appearance: She is well-developed.  HENT:     Head: Normocephalic and atraumatic.  Eyes:     Extraocular Movements: Extraocular movements intact.     Conjunctiva/sclera: Conjunctivae normal.     Pupils: Pupils are equal, round, and reactive to light.  Cardiovascular:     Rate and Rhythm: Normal rate and regular rhythm.     Pulses: Normal pulses.     Heart sounds: Normal heart sounds. No murmur heard. Pulmonary:     Effort: Pulmonary effort is normal. No respiratory distress.     Breath sounds: Normal breath sounds.  Abdominal:     Palpations: Abdomen is soft.     Tenderness: There is no abdominal tenderness.  Musculoskeletal:        General: No swelling.     Cervical back: Normal range of motion and neck supple. No rigidity.  Skin:    General: Skin is warm and dry.     Capillary Refill: Capillary refill takes less than  2 seconds.  Neurological:     Comments: Patient with spontaneous eye opening, she has some increased tone in her extremities, tremors, will not speak or follow commands  Psychiatric:        Mood and Affect: Mood normal.     ED Results / Procedures / Treatments   Labs (all labs ordered are listed, but only abnormal results are displayed) Labs Reviewed  CBC WITH DIFFERENTIAL/PLATELET - Abnormal; Notable for the following components:      Result Value   Neutro Abs 1.5 (*)    All other components within normal limits  COMPREHENSIVE METABOLIC PANEL - Abnormal; Notable for the following components:   Glucose, Bld 112 (*)    All other components within normal limits  D-DIMER, QUANTITATIVE - Abnormal; Notable for the following components:   D-Dimer, Quant 0.56 (*)    All other components within normal limits   SARS CORONAVIRUS 2 BY RT PCR  LIPASE, BLOOD  CBG MONITORING, ED  I-STAT BETA HCG BLOOD, ED ([facility], [facility], [facility] ONLY)  TROPONIN I (HIGH SENSITIVITY)  TROPONIN I (HIGH SENSITIVITY)    EKG EKG Interpretation  Date/Time:  Thursday July 24 2022 10:06:32 EDT Ventricular Rate:  108 PR Interval:  145 QRS Duration: 93 QT Interval:  336 QTC Calculation: 451 R Axis:   70 Text Interpretation: Sinus tachycardia Confirmed by Virgina Norfolk ([id number]) on 07/24/2022 10:11:56 AM  Radiology CT Angio Chest PE W and/or Wo Contrast  Result Date: 07/24/2022 CLINICAL DATA:  Pulmonary embolism suspected. Positive D-dimer. Patient loss consciousness. EXAM: CT ANGIOGRAPHY CHEST WITH CONTRAST TECHNIQUE: Multidetector CT imaging of the chest was performed using the standard protocol during bolus administration of intravenous contrast. Multiplanar CT image reconstructions and MIPs were obtained to evaluate the vascular anatomy. RADIATION DOSE REDUCTION: This exam was performed according to the departmental dose-optimization program which includes automated exposure control, adjustment of the mA and/or kV according to patient size and/or use of iterative reconstruction technique. CONTRAST:  43mL OMNIPAQUE IOHEXOL 350 MG/ML SOLN COMPARISON:  Chest radiography same day FINDINGS: Cardiovascular: Heart size is normal. No pericardial fluid. No current air artery calcification or aortic atherosclerotic calcification. Pulmonary arterial opacification is good. There are no pulmonary emboli. Mediastinum/Nodes: No mass or adenopathy. Lungs/Pleura: The lungs are clear.  No pleural effusion. Upper Abdomen: No upper abdominal abnormality is seen. Musculoskeletal: Mild spinal curvature.  No acute finding. Review of the MIP images confirms the above findings. IMPRESSION: No pulmonary emboli or other acute chest pathology. Electronically Signed   By: Paulina Fusi M.D.   On: 07/24/2022 11:57   CT Head Wo Contrast  Result Date: 07/24/2022 CLINICAL  DATA:  Mental status change, unknown cause EXAM: CT HEAD WITHOUT CONTRAST TECHNIQUE: Contiguous axial images were obtained from the base of the skull through the vertex without intravenous contrast. RADIATION DOSE REDUCTION: This exam was performed according to the departmental dose-optimization program which includes automated exposure control, adjustment of the mA and/or kV according to patient size and/or use of iterative reconstruction technique. COMPARISON:  None Available. FINDINGS: Brain: No evidence of acute infarction, hemorrhage, hydrocephalus, extra-axial collection or mass lesion/mass effect. Vascular: No hyperdense vessel. Skull: No acute fracture. Sinuses/Orbits: Clear sinuses.  No acute orbital findings. Other: No mastoid effusions. IMPRESSION: No evidence of acute intracranial abnormality. Electronically Signed   By: Feliberto Harts M.D.   On: 07/24/2022 10:37   DG Chest Portable 1 View  Result Date: 07/24/2022 CLINICAL DATA:  Altered mental status EXAM: PORTABLE CHEST 1 VIEW COMPARISON:  Chest x-ray dated April 07, 2017 FINDINGS: The heart size and mediastinal contours are within normal limits. Both lungs are clear. The visualized skeletal structures are unremarkable. IMPRESSION: No active disease. Electronically Signed   By: Allegra Lai M.D.   On: 07/24/2022 10:28   MR ANKLE LEFT WO CONTRAST  Result Date: 07/23/2022 CLINICAL DATA:  Bilateral ankle pain for 7 months. EXAM: MRI OF THE LEFT ANKLE WITHOUT CONTRAST TECHNIQUE: Multiplanar, multisequence MR imaging of the ankle was performed. No intravenous contrast was administered. COMPARISON:  None Available. FINDINGS: TENDONS Peroneal: Peroneal longus tendon intact. Peroneal brevis intact. Posteromedial: Posterior tibial tendon intact. Flexor hallucis longus tendon intact. Flexor digitorum longus tendon intact. Anterior: Tibialis anterior tendon intact. Extensor hallucis longus tendon intact Extensor digitorum longus tendon intact.  Achilles:  Intact. Plantar Fascia: Intact. LIGAMENTS Lateral: Anterior talofibular ligament intact. Calcaneofibular ligament intact. Posterior talofibular ligament intact. Anterior and posterior tibiofibular ligaments intact. Medial: Deltoid ligament intact. Spring ligament intact. CARTILAGE Ankle Joint: No joint effusion. Normal ankle mortise. No chondral defect. Subtalar Joints/Sinus Tarsi: Normal subtalar joints. Small posterior subtalar joint effusion. Normal sinus tarsi. Bones: No aggressive osseous lesion. No fracture or dislocation. Relative pes planus. Small talonavicular joint effusion. Soft Tissue: No fluid collection or hematoma. Muscles are normal without edema or atrophy. Tarsal tunnel is normal. IMPRESSION: 1. No acute injury of the left ankle. 2. Small posterior subtalar and talonavicular joint effusion. 3. Relative pes planus. Electronically Signed   By: Elige Ko M.D.   On: 07/23/2022 07:26   MR ANKLE RIGHT WO CONTRAST  Result Date: 07/23/2022 CLINICAL DATA:  Bilateral ankle pain and weakness for 7 months EXAM: MRI OF THE RIGHT ANKLE WITHOUT CONTRAST TECHNIQUE: Multiplanar, multisequence MR imaging of the ankle was performed. No intravenous contrast was administered. COMPARISON:  None Available. FINDINGS: TENDONS Peroneal: Peroneal longus tendon intact. Peroneal brevis intact. Posteromedial: Posterior tibial tendon intact. Flexor hallucis longus tendon intact. Flexor digitorum longus tendon intact. Small amount of fluid in the flexor hallucis longus tendon sheath. Anterior: Tibialis anterior tendon intact. Extensor hallucis longus tendon intact Extensor digitorum longus tendon intact. Achilles:  Intact. Plantar Fascia: Intact. LIGAMENTS Lateral: Anterior talofibular ligament intact. Calcaneofibular ligament intact. Posterior talofibular ligament intact. Anterior and posterior tibiofibular ligaments intact. Medial: Deltoid ligament intact. Spring ligament intact. CARTILAGE Ankle Joint: No  joint effusion. Normal ankle mortise. No chondral defect. Subtalar Joints/Sinus Tarsi: Normal subtalar joints. Small posterior subtalar joint effusion. Normal sinus tarsi. Bones: No aggressive osseous lesion. No fracture or dislocation. Small talonavicular joint effusion. Relative pes planus. Soft Tissue: No fluid collection or hematoma. Muscles are normal without edema or atrophy. Tarsal tunnel is normal. IMPRESSION: 1. No acute injury of the right ankle. 2. Relative pes planus. 3. Small talonavicular joint and posterior subtalar joint effusions. 4. Mild flexor hallucis longus tenosynovitis. Electronically Signed   By: Elige Ko M.D.   On: 07/23/2022 06:52    Procedures Procedures    Medications Ordered in ED Medications  sodium chloride (PF) 0.9 % injection (  Not Given 07/24/22 1112)  sodium chloride 0.9 % bolus 1,000 mL (1,000 mLs Intravenous New Bag/Given 07/24/22 1106)  LORazepam (ATIVAN) injection 1 mg (1 mg Intravenous Given 07/24/22 1024)  LORazepam (ATIVAN) injection 1 mg (1 mg Intravenous Given 07/24/22 1106)  iohexol (OMNIPAQUE) 350 MG/ML injection 75 mL (75 mLs Intravenous Contrast Given 07/24/22 1141)    ED Course/ Medical Decision Making/ A&P  Medical Decision Making Amount and/or Complexity of Data Reviewed Labs: ordered. Radiology: ordered.  Risk Prescription drug management. Decision regarding hospitalization.   [name] is here with episode of loss of consciousness.  Unremarkable vitals except for tachycardia.  Level 5 caveat as patient does not respond to questions.  She has some increased tone in all 4 extremities with stiffening.  She appears to be hyperventilating.  Per nursing staff that was with her upstairs in the hospital where she is intact she started to have some palpitations, not feeling well like she is going to pass out, was very tearful.  When she just stopped responding.  She is helped to the floor.  She seems to be having  some stiffening in her arms and stopped responding to questions.  People who brought her down states that she has been dealing with some swelling in her legs for a long time but are not aware of any stress or anxiety or psych history or seizure history.  Overall her eyes are open spontaneously, she does have increased tone in her extremities but not quite sure if this is seizure activity.  Differential diagnosis is likely stress related/panic attack versus possibly seizure.  We will give a dose IV Ativan.  She has no fever.  She was in her normal state of health this morning at work.  Has not been sick recently.  I have very low suspicion that this is an infectious process.  She was complained of some chest pain and will do cardiac work-up/pulmonary work-up.  We will get CBC, CMP, troponin, D-dimer, chest x-ray, head CT.  Following first dose of Ativan the tongue her body is improved.  She is intermittently tearful, seems to be trying to talk but still not answering questions appropriately.  Her father is here now.  Seems like she is trying to answers questions but no words are coming out.  She moves her extremities spontaneously.  Head CT per my review and interpretation shows no acute findings.  Per further review of labs my interpretation is there is no significant anemia, electrolyte abnormality, kidney injury.  No leukocytosis.  Troponin normal.  D-dimer mildly elevated with CT scan to fully rule out PE.  Chest x-ray with no evidence of pneumonia or pneumothorax.  Will give additional dose of IV Ativan and reevaluate.  We will touch base with neurology.  Not sure if get an EEG would be helpful to fully rule out seizures.  Patient reevaluated at noon.  She appears more awake and alert but still not talking much or fully back to her baseline.  I talked with Dr. Amada Jupiter with neurology and upon further chart review it seems like she had a similar episode a few months back.  Dr. Otelia Limes had evaluated her.   Seem like she had a fairly intense panic attack during her biopsy.  Did not seem to be seizure activity at that time.  We will continue to observe patient, await CT scan of her chest.  After talking with neurology as patient hasnt fully returned to normal can admit her and get EEG and may be psychiatric evaluation.  CT scan of the chest showed no PE.  Overall patient appears to be improving but still not fully at her baseline.  I think it is reasonable to keep her overnight to continue to monitor, get EEG.  This chart was dictated using voice recognition software.  Despite best efforts to proofread,  errors can occur which can change the documentation meaning.  Final Clinical Impression(s) / ED Diagnoses Final diagnoses:  Seizure-like activity [facility](HCC)    Rx / DC Orders ED Discharge Orders     None         Virgina NorfolkCuratolo, Earsie Humm, DO 07/24/22 1209

## 2022-07-24 NOTE — ED Triage Notes (Addendum)
Pt coming from work. Pt lost consciousness, began shaking, became flaccid and then rigid soon after. Pt non verbal at this time.

## 2022-07-24 NOTE — ED Notes (Signed)
EEG at bedside.

## 2022-07-24 NOTE — H&P (Signed)
History and Physical    Patient: Barbara Rice DUK:[id number] DOB: [birth year] DOA: 07/24/2022 DOS: the patient was seen and examined on 07/24/2022 PCP: Kathrynn Speed, NP  Patient coming from: Home  Chief Complaint:  Chief Complaint  Patient presents with   Loss of Consciousness   HPI: Barbara Rice is a 26 y.o. 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  Witnesses stated that the patient has some palpitations, became tearful and lightheaded.  They helped her to the floor.  She became unresponsive.  The patient started "shaking" all her body.  She lost consciousness for a while.  She also stated that she felt short of breath when the symptoms begun.  As she had a similar episode in the past and was seen by neurology (Dr. Otelia Limes).  Denied headache or blurred vision.  Her review of system is otherwise unremarkable.  She denied fever, chills, rhinorrhea, sore throat, wheezing or hemoptysis.  No chest pain, palpitations, diaphoresis, PND, orthopnea or pitting edema of the lower extremities.  No abdominal pain, nausea, emesis, diarrhea, constipation, melena or hematochezia.  No flank pain, dysuria, frequency or hematuria.  No polyuria, polydipsia, polyphagia.  ED course: Initial vital signs were temperature 98.5 F, pulse 104, respiration 19, BP 162/101 mmHg O2 sat 100% on room air.  She received lorazepam 1 mg IVP x2 and 1000 mL of normal saline bolus in the emergency department.  Neurology on-call was contacted and suggested observation and evaluation with EEG.  Lab work: Her D-dimer was mildly elevated at 0.56 mcg/mL.  CBC, troponin x2 and lipase were unremarkable.  Coronavirus PCR was negative.  CMP showed a mildly elevated glucose at 112 mg/dL, but all other CMP measurements were normal.  Imaging: Portable 1 view chest radiograph  with no active disease.  CT head with contrast with no acute intracranial abnormality.  CTA chest with no pulmonary emboli or other acute chest pathology.   Review of Systems: As mentioned in the history of present illness. All other systems reviewed and are negative.  Past Medical History:  Diagnosis Date   Sciatic nerve pain    History reviewed. No pertinent surgical history. Social History:  reports that she has never smoked. She has never used smokeless tobacco. She reports that she does not drink alcohol and does not use drugs.  No Known Allergies  Family History  Problem Relation Age of Onset   Prostatitis Father    Hypertension Father    Cervical cancer Paternal Aunt    Ovarian cancer Paternal Aunt    Cancer Cousin     Prior to Admission medications   Medication Sig Start Date End Date Taking? Authorizing Provider  acetaminophen-codeine (TYLENOL #3) 300-30 MG tablet Take 1 tablet by mouth every 6 (six) hours as needed for moderate pain.   Yes [provider]  amLODipine (NORVASC) 10 MG tablet Take 1 tablet (10 mg total) by mouth daily. 06/24/22 09/22/22 Yes Passmore, Enid Derry I, NP  cyclobenzaprine (FLEXERIL) 5 MG tablet Take 1 tablet (5 mg total) by mouth 3 (three) times daily as needed for muscle spasms. 04/21/22  Yes Passmore, Enid Derry I, NP  ibuprofen (ADVIL) 200 MG tablet Take 400 mg by mouth every 6 (six) hours as needed for mild pain.   Yes [provider]  meloxicam (MOBIC) 15 MG tablet Take 1 tablet (15 mg total)  by mouth daily. 06/24/22  Yes Passmore, Enid Derry I, NP  Sodium Fluoride 0.55 (0.25 F) MG/DROP SOLN 5 mLs by Mouth Rinse route at bedtime.   Yes [provider]    Physical Exam: Vitals:   07/24/22 1115 07/24/22 1130 07/24/22 1515 07/24/22 1530  BP: 121/82 130/82 (!) 136/97 132/89  Pulse: 94 98 89 (!) 102  Resp: (!) 21 (!) 23 (!) 24 16  Temp:      TempSrc:      SpO2: 100% 100% 100% 100%   Physical Exam Vitals and nursing note  reviewed.  Constitutional:      General: She is awake.     Appearance: Normal appearance.  HENT:     Head: Normocephalic.     Mouth/Throat:     Mouth: Mucous membranes are moist.  Eyes:     General: No scleral icterus.    Pupils: Pupils are equal, round, and reactive to light.  Neck:     Vascular: No JVD.  Cardiovascular:     Rate and Rhythm: Normal rate and regular rhythm.     Heart sounds: S1 normal and S2 normal.     Comments: Mild bilateral nonpitting edema. Pulmonary:     Effort: Pulmonary effort is normal.     Breath sounds: Normal breath sounds. No wheezing, rhonchi or rales.  Abdominal:     General: Bowel sounds are normal. There is no distension.     Palpations: Abdomen is soft.     Tenderness: There is no abdominal tenderness. There is no guarding.  Musculoskeletal:     Cervical back: Neck supple.  Skin:    General: Skin is warm and dry.  Neurological:     General: No focal deficit present.     Mental Status: She is alert and oriented to person, place, and time.     Cranial Nerves: No cranial nerve deficit.     Motor: No weakness.  Psychiatric:        Mood and Affect: Mood normal.        Behavior: Behavior normal. Behavior is cooperative.    Data Reviewed:  There are no new results to review at this time.  EKG: Vent. rate 108 BPM PR interval 145 ms QRS duration 93 ms QT/QTcB 336/451 ms P-R-T axes 64 70 53 Sinus tachycardia RSR' in V1 or V2, right VCD or RVH Artifact in lead(s) III aVR aVL aVF V1 V2 V6  Assessment and Plan: Principal Problem:   Syncope Symptoms suspicious for seizures. Observation/telemetry. Seizure precautions. Fall precautions. Obtain EEG. Obtain echocardiogram.      Advance Care Planning:   Code Status: Full Code   Consults:   Family Communication:   Severity of Illness: The appropriate patient status for this patient is OBSERVATION. Observation status is judged to be reasonable and necessary in order to provide the  required intensity of service to ensure the patient's safety. The patient's presenting symptoms, physical exam findings, and initial radiographic and laboratory data in the context of their medical condition is felt to place them at decreased risk for further clinical deterioration. Furthermore, it is anticipated that the patient will be medically stable for discharge from the hospital within 2 midnights of admission.   Author: Bobette Mo, MD 07/24/2022 3:42 PM  For on call review [email or web address].   This document was prepared using Dragon voice recognition software and may contain some unintended transcription errors.

## 2022-07-24 NOTE — Progress Notes (Signed)
EEG complete - results pending 

## 2022-07-25 ENCOUNTER — Observation Stay (HOSPITAL_BASED_OUTPATIENT_CLINIC_OR_DEPARTMENT_OTHER): Payer: 59

## 2022-07-25 ENCOUNTER — Observation Stay (HOSPITAL_COMMUNITY): Payer: 59

## 2022-07-25 DIAGNOSIS — R55 Syncope and collapse: Secondary | ICD-10-CM | POA: Diagnosis present

## 2022-07-25 DIAGNOSIS — F458 Other somatoform disorders: Secondary | ICD-10-CM | POA: Diagnosis present

## 2022-07-25 DIAGNOSIS — M7989 Other specified soft tissue disorders: Secondary | ICD-10-CM | POA: Diagnosis present

## 2022-07-25 DIAGNOSIS — R064 Hyperventilation: Secondary | ICD-10-CM | POA: Diagnosis present

## 2022-07-25 DIAGNOSIS — R590 Localized enlarged lymph nodes: Secondary | ICD-10-CM | POA: Diagnosis not present

## 2022-07-25 DIAGNOSIS — M25472 Effusion, left ankle: Secondary | ICD-10-CM | POA: Diagnosis present

## 2022-07-25 DIAGNOSIS — R4020 Unspecified coma: Secondary | ICD-10-CM | POA: Diagnosis not present

## 2022-07-25 DIAGNOSIS — Z20822 Contact with and (suspected) exposure to covid-19: Secondary | ICD-10-CM | POA: Diagnosis present

## 2022-07-25 DIAGNOSIS — D509 Iron deficiency anemia, unspecified: Secondary | ICD-10-CM | POA: Diagnosis present

## 2022-07-25 DIAGNOSIS — R000 Tachycardia, unspecified: Secondary | ICD-10-CM | POA: Diagnosis present

## 2022-07-25 DIAGNOSIS — Z79899 Other long term (current) drug therapy: Secondary | ICD-10-CM | POA: Diagnosis not present

## 2022-07-25 DIAGNOSIS — R739 Hyperglycemia, unspecified: Secondary | ICD-10-CM | POA: Diagnosis present

## 2022-07-25 DIAGNOSIS — Z91014 Allergy to mammalian meats: Secondary | ICD-10-CM | POA: Diagnosis not present

## 2022-07-25 DIAGNOSIS — I10 Essential (primary) hypertension: Secondary | ICD-10-CM

## 2022-07-25 DIAGNOSIS — R7989 Other specified abnormal findings of blood chemistry: Secondary | ICD-10-CM | POA: Diagnosis not present

## 2022-07-25 DIAGNOSIS — Z791 Long term (current) use of non-steroidal anti-inflammatories (NSAID): Secondary | ICD-10-CM | POA: Diagnosis not present

## 2022-07-25 DIAGNOSIS — R569 Unspecified convulsions: Principal | ICD-10-CM

## 2022-07-25 DIAGNOSIS — R002 Palpitations: Secondary | ICD-10-CM | POA: Diagnosis present

## 2022-07-25 DIAGNOSIS — R42 Dizziness and giddiness: Secondary | ICD-10-CM | POA: Diagnosis present

## 2022-07-25 DIAGNOSIS — D72819 Decreased white blood cell count, unspecified: Secondary | ICD-10-CM | POA: Diagnosis present

## 2022-07-25 DIAGNOSIS — Z8049 Family history of malignant neoplasm of other genital organs: Secondary | ICD-10-CM | POA: Diagnosis not present

## 2022-07-25 DIAGNOSIS — Z8249 Family history of ischemic heart disease and other diseases of the circulatory system: Secondary | ICD-10-CM | POA: Diagnosis not present

## 2022-07-25 DIAGNOSIS — D649 Anemia, unspecified: Secondary | ICD-10-CM | POA: Diagnosis not present

## 2022-07-25 DIAGNOSIS — Z8041 Family history of malignant neoplasm of ovary: Secondary | ICD-10-CM | POA: Diagnosis not present

## 2022-07-25 DIAGNOSIS — I890 Lymphedema, not elsewhere classified: Secondary | ICD-10-CM

## 2022-07-25 DIAGNOSIS — M25471 Effusion, right ankle: Secondary | ICD-10-CM | POA: Diagnosis present

## 2022-07-25 DIAGNOSIS — F410 Panic disorder [episodic paroxysmal anxiety] without agoraphobia: Secondary | ICD-10-CM | POA: Diagnosis present

## 2022-07-25 LAB — RPR: RPR Ser Ql: NONREACTIVE

## 2022-07-25 LAB — IRON AND TIBC
Iron: 60 ug/dL (ref 28–170)
Saturation Ratios: 14 % (ref 10.4–31.8)
TIBC: 428 ug/dL (ref 250–450)
UIBC: 368 ug/dL

## 2022-07-25 LAB — COMPREHENSIVE METABOLIC PANEL
ALT: 14 U/L (ref 0–44)
AST: 21 U/L (ref 15–41)
Albumin: 4.2 g/dL (ref 3.5–5.0)
Alkaline Phosphatase: 41 U/L (ref 38–126)
Anion gap: 10 (ref 5–15)
BUN: 11 mg/dL (ref 6–20)
CO2: 23 mmol/L (ref 22–32)
Calcium: 9.6 mg/dL (ref 8.9–10.3)
Chloride: 109 mmol/L (ref 98–111)
Creatinine, Ser: 0.68 mg/dL (ref 0.44–1.00)
GFR, Estimated: >60 mL/min (ref >60)
Glucose, Bld: 103 mg/dL — ABNORMAL HIGH (ref 70–99)
Potassium: 3.7 mmol/L (ref 3.5–5.1)
Sodium: 142 mmol/L (ref 135–145)
Total Bilirubin: 0.6 mg/dL (ref 0.3–1.2)
Total Protein: 7.3 g/dL (ref 6.5–8.1)

## 2022-07-25 LAB — CBC
HCT: 35.4 % — ABNORMAL LOW (ref 36.0–46.0)
Hemoglobin: 11.3 g/dL — ABNORMAL LOW (ref 12.0–15.0)
MCH: 28.4 pg (ref 26.0–34.0)
MCHC: 31.9 g/dL (ref 30.0–36.0)
MCV: 88.9 fL (ref 80.0–100.0)
Platelets: 197 10*3/uL (ref 150–400)
RBC: 3.98 MIL/uL (ref 3.87–5.11)
RDW: 12.9 % (ref 11.5–15.5)
WBC: 3.6 10*3/uL — ABNORMAL LOW (ref 4.0–10.5)
nRBC: 0 % (ref 0.0–0.2)

## 2022-07-25 LAB — TSH: TSH: 2.346 u[IU]/mL (ref 0.350–4.500)

## 2022-07-25 LAB — GLUCOSE, CAPILLARY
Glucose-Capillary: 82 mg/dL (ref 70–99)
Glucose-Capillary: 93 mg/dL (ref 70–99)

## 2022-07-25 LAB — RAPID URINE DRUG SCREEN, HOSP PERFORMED
Amphetamines: NOT DETECTED
Barbiturates: NOT DETECTED
Benzodiazepines: NOT DETECTED
Cocaine: NOT DETECTED
Opiates: NOT DETECTED
Tetrahydrocannabinol: NOT DETECTED

## 2022-07-25 LAB — MRSA NEXT GEN BY PCR, NASAL: MRSA by PCR Next Gen: NOT DETECTED

## 2022-07-25 LAB — RAPID HIV SCREEN (HIV 1/2 AB+AG)
HIV 1/2 Antibodies: NONREACTIVE
HIV-1 P24 Antigen - HIV24: NONREACTIVE

## 2022-07-25 LAB — SEDIMENTATION RATE: Sed Rate: 8 mm/hr (ref 0–22)

## 2022-07-25 LAB — FERRITIN: Ferritin: 6 ng/mL — ABNORMAL LOW (ref 11–307)

## 2022-07-25 LAB — CK: Total CK: 169 U/L (ref 38–234)

## 2022-07-25 LAB — RETICULOCYTES
Immature Retic Fract: 8.9 % (ref 2.3–15.9)
RBC.: 4.07 MIL/uL (ref 3.87–5.11)
Retic Count, Absolute: 55.8 10*3/uL (ref 19.0–186.0)
Retic Ct Pct: 1.4 % (ref 0.4–3.1)

## 2022-07-25 LAB — FOLATE: Folate: 23.1 ng/mL (ref >5.9)

## 2022-07-25 LAB — HIV ANTIBODY (ROUTINE TESTING W REFLEX): HIV Screen 4th Generation wRfx: NONREACTIVE

## 2022-07-25 LAB — MAGNESIUM: Magnesium: 1.9 mg/dL (ref 1.7–2.4)

## 2022-07-25 LAB — VITAMIN B12: Vitamin B-12: 722 pg/mL (ref 180–914)

## 2022-07-25 MED ORDER — LORAZEPAM 2 MG/ML IJ SOLN
1.0000 mg | INTRAMUSCULAR | Status: DC | PRN
  Administered 2022-07-25: 1 mg via INTRAVENOUS

## 2022-07-25 MED ORDER — LEVETIRACETAM IN NACL 1500 MG/100ML IV SOLN
1500.0000 mg | Freq: Once | INTRAVENOUS | Status: AC
Start: 2022-07-25 — End: 2022-07-25
  Administered 2022-07-25: 1500 mg via INTRAVENOUS
  Filled 2022-07-25: qty 100

## 2022-07-25 MED ORDER — CHLORHEXIDINE GLUCONATE CLOTH 2 % EX PADS
6.0000 | MEDICATED_PAD | Freq: Every day | CUTANEOUS | Status: DC
  Administered 2022-07-25 – 2022-07-26 (×2): 6 via TOPICAL

## 2022-07-25 MED ORDER — SODIUM CHLORIDE 0.9 % IV SOLN: 250.0000 mg | Freq: Once | INTRAVENOUS | Status: DC

## 2022-07-25 MED ORDER — GADOBUTROL 1 MMOL/ML IV SOLN
8.0000 mL | Freq: Once | INTRAVENOUS | Status: AC | PRN
  Administered 2022-07-25: 8 mL via INTRAVENOUS

## 2022-07-25 MED ORDER — ORAL CARE MOUTH RINSE
15.0000 mL | OROMUCOSAL | Status: DC | PRN
Start: 2022-07-25 — End: 2022-07-29

## 2022-07-25 MED ORDER — FERROUS SULFATE 325 (65 FE) MG PO TABS
325.0000 mg | ORAL_TABLET | Freq: Two times a day (BID) | ORAL | Status: DC
  Administered 2022-07-25 – 2022-07-29 (×7): 325 mg via ORAL
  Filled 2022-07-25 (×7): qty 1

## 2022-07-25 MED ORDER — LEVETIRACETAM 500 MG PO TABS
750.0000 mg | ORAL_TABLET | Freq: Two times a day (BID) | ORAL | Status: DC
  Filled 2022-07-25: qty 1

## 2022-07-25 NOTE — Progress Notes (Signed)
Xcover  Pt was apparently walking w assistance and felt weak, like she was going to pass out, and was placed in chair.  Pt became unresponsive and then started to have some generalized shaking.  Pt received ativan and is currently more responsive.  See rapid response note for details.  Pt denies any family hx of seizure.    Exam: Axox3, able to speak fluently Heent: anicteric, pupils 1.32m symmetric, direct, consensual, near intact, no nystagmus.   Motor 5/5 in all 4 ext Reflexes 2+ symmetric, diffuse with downgoing toes bil/ no clonus  A/P  Suspected Seizure Ativan given EEG Check cbc, cmp, tsh, esr, ana, rpr Check MRI brain Tele neurology consulted for input by rapid response while I was present.  Transfer to [place] RN input

## 2022-07-25 NOTE — Procedures (Signed)
Patient Name: Kandise Riehle  MRN: [id number]  Epilepsy Attending: Charlsie Quest  Referring Physician/Provider: Virgina Norfolk, DO  Date: 07/24/2022 Duration: 27.18 mins  Patient history: 26 year old female with syncope.  EEG Drolet for seizure.  Level of alertness: Awake, asleep  AEDs during EEG study: None  Technical aspects: This EEG study was done with scalp electrodes positioned according to the 10-20 International system of electrode placement. Electrical activity was reviewed with band pass filter of 1-70Hz , sensitivity of 7 uV/mm, display speed of 2mm/sec with a 60Hz  notched filter applied as appropriate. EEG data were recorded continuously and digitally stored.  Video monitoring was available and reviewed as appropriate.  Description: The posterior dominant rhythm consists of 9-10 Hz activity of moderate voltage (25-35 uV) seen predominantly in posterior head regions, symmetric and reactive to eye opening and eye closing. Sleep was characterized by vertex waves, sleep spindles (12 to 14 Hz), maximal frontocentral region. Physiologic photic driving was seen during photic stimulation. Hyperventilation was not performed.     IMPRESSION: This study is within normal limits. No seizures or epileptiform discharges were seen throughout the recording.  Gennette Shadix 

## 2022-07-25 NOTE — Progress Notes (Signed)
Bilateral lower extremity venous duplex has been completed. Preliminary results can be found in CV Proc through chart review.   07/25/22 12:04 PM Olen Cordial RVT

## 2022-07-25 NOTE — Progress Notes (Signed)
Transition of Care [facility]) Screening Note  Patient Details  Name: Norena Bratton Date of Birth: September 23, 1996  Transition of Care [facility]) CM/SW Contact:    Ewing Schlein, LCSW Phone Number: 07/25/2022, 9:43 AM  Transition of Care Department [facility]) has reviewed patient and no TOC needs have been identified at this time. We will continue to monitor patient advancement through interdisciplinary progression rounds. If new patient transition needs arise, please place a TOC consult.

## 2022-07-25 NOTE — Consult Note (Signed)
[facility] TeleNeurology Consult Services  Stat Consult  Patient Name:   Kruti, Horacek Date of Birth:   [birth year] Identification Number:   MRN - [id number] Date of Service:   07/25/2022 02:01:58  Diagnosis:       R56.9 - Seizures  Impression 26 y/o with PMH of lymphedema, HTN. She had a spell about a month ago and another tonight. She has received 2mg  lorazepam. EEG has been acquired, no formal report available yet. Per Nursing, she was nonresponsive for about an hour; someone saw her shaking. She is answering questions better now and is tired but arouses easily and is oriented and her neuro exam is normal, as is NCCT head.  Recommendations: 1) Enhanced brain MRI 2) Load keppra 1500mg  IV, maintenance for now 750mg  bid iv/po 3) Neuro f/u later this am. EEG report may be available by then.   ----------------------------------------------------------------------------------------------------    Metrics: [facility] Notification Time: 07/25/2022 02:01:58 Stamp Time: 07/25/2022 02:01:58 Callback Response Time: 07/25/2022 02:03:25     ----------------------------------------------------------------------------------------------------  Chief Complaint: spells  History of Present Illness: Patient is a 26 year old Female. 26 y/o with PMH of lymphedema, HTN. She had a spell about a month ago and another tonight. She has received 2mg  lorazepam. EEG has been acquired, no formal report available yet. Per Nursing, she was nonresponsive for about an hour; someone saw her shaking. She is answering questions better now and is tired but arouses easily and is oriented.   Past Medical History:      There is no history of Hypertension  Medications:  No Anticoagulant use  No Antiplatelet use Reviewed EMR for current medications  Allergies:  Reviewed  Social History: Unable To Obtain Due To Patient Status : Patient Cannot Communicate Relevant Social  History  Family History:  There is no family history of premature cerebrovascular disease pertinent to this consultation  ROS : 14 Points Review of Systems was performed and was negative except mentioned in HPI.  Past Surgical History: There Is No Surgical History Contributory To Today's Visit   Examination: BP(168/98), Pulse(80),  Neuro Exam:   Tired but arouses to voice, oriented, MAE, normal CN      Patient / Family was informed the Neurology Consult would occur via TeleHealth consult by way of interactive audio and video telecommunications and consented to receiving care in this manner.  Patient is being evaluated for possible acute neurologic impairment and high probability of imminent or life - threatening deterioration.I spent total of 35 minutes providing care to this patient, including time for face to face visit via telemedicine, review of medical records, imaging studies and discussion of findings with providers, the patient and / or family.   Dr 07/27/2022   [facility] For Inpatient follow-up with [facility] physician please call [facility] [phone]. This is not an outpatient service. Post hospital discharge, please contact hospital directly.

## 2022-07-25 NOTE — Progress Notes (Addendum)
Inpatient Rehab Admissions Coordinator:   Per PT recommendations pt was screened for CIR by Estill Dooms, PT, DPT.  At this time it is not clear that pt demonstrates the medical necessity for a CIR admission.  MRI brain and EEG normal.  Will rescreen in another day or so once further workup completed to determine whether she meets criteria.    Estill Dooms, PT, DPT Admissions Coordinator [phone] 07/25/22  3:51 PM

## 2022-07-25 NOTE — Consult Note (Signed)
Neurology Consultation Reason for Consult: Possible seizures Referring Physician: Cyndia Skeeters, T  CC: Possible seizures  History is obtained from: Patient  HPI: Barbara Rice is a 26 y.o. female who was seen about a month ago for an episode after a biopsy.  Per the note of Dr. Cheral Marker, the patient had multiple findings concerning for functional etiology at the time.  She then had no further episodes until yesterday.  Of note, she underwent MRI of bilateral ankles for concern of bilateral ankle swelling and was awaiting those results.  Yesterday she had an episode while working where she had shaking of all extremities, she states that she had preserved consciousness throughout the episode.  She remembers shaking on both sides.  She thinks it lasted about 45 minutes to an hour.  She was able to respond to some questions while shaking.  She was admitted for EEG and MRI, EEG was performed which was negative.  Overnight, she states that she felt "shaky" inside and then began shaking outside as well, this is bilateral, again with preserved consciousness.  She was evaluated by teleneurology, though not certain if they witnessed her shaking.  She had been given Ativan, and was arousing by the time of his evaluation.  He loaded her with Keppra and a stat cerebel was placed.  This was also normal.   Past Medical History:  Diagnosis Date   Sciatic nerve pain      Family History  Problem Relation Age of Onset   Prostatitis Father    Hypertension Father    Cervical cancer Paternal Aunt    Ovarian cancer Paternal Aunt    Cancer Cousin      Social History:  reports that she has never smoked. She has never used smokeless tobacco. She reports that she does not drink alcohol and does not use drugs.   Exam: Current vital signs: BP (!) 142/89   Pulse 92   Temp 97.9 F (36.6 C) (Oral)   Resp 18   Ht 5' 7"  (1.702 m)   Wt 79.6 kg   SpO2 100%   BMI 27.49 kg/m  Vital signs in last 24 hours: Temp:   [97.8 F (36.6 C)-98.6 F (37 C)] 97.9 F (36.6 C) (07/28 1557) Pulse Rate:  [58-92] 92 (07/28 1822) Resp:  [14-29] 18 (07/28 1822) BP: (120-168)/(67-105) 142/89 (07/28 1818) SpO2:  [99 %-100 %] 100 % (07/28 1822) Weight:  [79.6 kg] 79.6 kg (07/28 0236)   Physical Exam  Constitutional: Appears well-developed and well-nourished.  Psych: Affect appropriate to situation Eyes: No scleral injection HENT: No OP obstruction MSK: no joint deformities.  Cardiovascular: Normal rate and regular rhythm.  Respiratory: Effort normal, non-labored breathing GI: Soft.  No distension. There is no tenderness.  Skin: WDI  Neuro: Mental Status: Patient is awake, alert, oriented to person, place, month, year, and situation. Patient is able to give a clear and coherent history. No signs of aphasia or neglect Cranial Nerves: II: Visual Fields are full. Pupils are equal, round, and reactive to light.   III,IV, VI: EOMI without ptosis or diploplia.  V: Facial sensation is symmetric to temperature VII: Facial movement is symmetric.  VIII: hearing is intact to voice X: Uvula elevates symmetrically XI: Shoulder shrug is symmetric. XII: tongue is midline without atrophy or fasciculations.  Motor: Tone is normal. Bulk is normal. 5/5 strength was present in all four extremities.  Sensory: Sensation is symmetric to light touch and temperature in the arms and legs. Deep Tendon Reflexes: 2+  and symmetric in the biceps and patellae.  Plantars: Toes are downgoing bilaterally.  Cerebellar: FNF and HKS are intact bilaterally      I have reviewed labs in epic and the results pertinent to this consultation are: B12 722 RPR normal ESR eight Calcium 9.6 Creatinine 0.68  I have reviewed the images obtained: CT/MRI-negative  Impression: 26 year old female with recurrent episodes with description highly suggestive of functional etiology.  This coupled with the fact that she had an exam described as  functional by previous neurologist a month ago, leads me to have a high concern that this is a functional presentation.  If she continues to have recurrent spells, may need to consider transfer for continuous EEG for spell characterization, but I think this is of low yield.  I would not continue levetiracetam at this time.  I discussed with her that I felt like her presentation was most likely stress related.  Recommendations: 1) discontinue levetiracetam 2) if she continues to have frequent recurrent spells, may need to transfer to [facility] for continuous EEG 3) neurological be available on an as-needed basis.   Roland Rack, MD [facility] [phone]  If 7pm- 7am, please page neurology on call as listed in [place].

## 2022-07-25 NOTE — Progress Notes (Signed)
After brushing teeth, patient experienced one small emesis episode. Patient states she's feeling weak, MRI time moved due at this time; MD notified.

## 2022-07-25 NOTE — Progress Notes (Signed)
PROGRESS NOTE  Barbara Rice WUJ:[id number] DOB: Oct 06, 1996   PCP: Bo Merino I, NP  Patient is from: Home.  Lives with family.  DOA: 07/24/2022 LOS: 0  Chief complaints Chief Complaint  Patient presents with   Loss of Consciousness     Brief Narrative / Interim history: 26 year old F with history of lymphedema, HTN, sciatica and inguinal LAD (negative pathology) sent to ED for loss of consciousness.  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  She suddenly became tearful with some palpitation, SOB, lightheadedness.  She was helped down to the floor by staff and lost consciousness for sometimes.  Reportedly had similar symptoms in the past and seen by neurology.  In ED, vitals stable except for mild tachycardia and hypertension.  Orthostatic vitals negative.  CMP, CBC, troponin, lipase, CT head and CTA chest without significant finding.  EKG sinus tachycardia to 108.  Pregnancy test negative.  D-dimer slightly elevated to 0.56.  Patient was admitted for possible syncope and seizure-like activity.   Patient had an episode of seizure-like activity when she became "unresponsive" and started to have some generalized shaking.  Evaluated by teleneurology.  She was given IV Ativan and started on Keppra.  EEG, TSH, RPR, ESR, HIV screen, ANA and UDS ordered.  EEG negative for epileptiform discharge or seizure.  TSH, RPR and ESR within normal.  Neurology consulted.  MRI brain ordered.   Subjective: Seen and examined earlier this morning.  Patient's father at bedside.  Patient is awake but not quite alert.  She reports dizziness.  When asked if she meant lightheadedness or spinning, she says both.  She felt like gagging while brushing her teeth this morning.  She was to go for MRI about 10 AM that was passed on to 2 PM due to an episode of dizziness when she tried to get up.  She denies numbness or tingling.  She denies new medication.  With father out of the room, she denies stress at work or  at home other than pain and swelling in her legs at the end of his shift.  She denies smoking cigarette, drinking alcohol recreational drug use.  No family history of seizure or heart disease.  Objective: Vitals:   07/25/22 0735 07/25/22 0746 07/25/22 0800 07/25/22 0900  BP:   (!) 129/102 (!) 145/89  Pulse: 89  89 92  Resp: 20  20 (!) 29  Temp:  98 F (36.7 C)    TempSrc:  Oral    SpO2: 100%  99% 100%  Weight:      Height:        Examination:  GENERAL: No apparent distress.  Nontoxic. HEENT: MMM.  Vision and hearing grossly intact.  NECK: Supple.  No apparent JVD.  No thyromegaly or lymphadenopathy. RESP:  No IWOB.  Fair aeration bilaterally. CVS:  RRR. Heart sounds normal.  ABD/GI/GU: BS+. Abd soft, NTND.  MSK/EXT:  Moves extremities. No apparent deformity. No edema.  SKIN: no apparent skin lesion or wound NEURO: Awake but not quite alert.  Oriented x4.  Speech clear. Cranial nerves II-XII grossly intact.  No nystagmus.  Motor intact in all muscle groups but limited effort.  Normal tone. Light sensation intact in all dermatomes of upper and lower ext bilaterally. Patellar reflex symmetric.   PSYCH: Calm. Normal affect.   Procedures:  None  Microbiology summarized: MRSA PCR screen negative.  Assessment and plan: Principal Problem:   Loss of consciousness ([place]) Active Problems:   Inguinal lymphadenopathy  Seizure-like activity ([facility])   Normocytic anemia  Loss of consciousness/dizziness/seizure-like activity-she had what looks like a panic attack.  Reportedly, she was tearful, short of breath with palpitation followed by loss of consciousness.  She was helped down to the floor by staff members.  No fall or head trauma.  She also have seizure-like activity last night.  Work-up including orthostatic vitals, CMP, CBC, CBG, troponin, EKG, TSH, RPR, ESR, HIV screen, pregnancy test, CT head, EEG, TTE and CTA chest unrevealing.  No event on telemetry.  She is awake but tends to fall  asleep quickly, which I believe could be from the Ativan and Keppra -MRI brain, UDS and ANA pending. -Neurology to see patient. -Continue Keppra and as needed Ativan until neurology evaluation -Discontinue IV fluid -Continue telemetry, seizure and fall precautions -PT/OT eval  Mildly elevated D-dimer: Nonspecific.  CTA chest negative for PE. -Check lower extremity venous Doppler  Normocytic anemia/mild leukopenia: Noted on morning labs.  Initial CBC was normal. -Check anemia panel  Lymphedema: Stable.  Unclear etiology of this.  She has normal albumin and TSH.  Anemia is very mild to cause.  Followed at lymphedema clinic. -Amlodipine is not a great choice  Essential hypertension: Normotensive.  On amlodipine at home but not a great choice with lymphedema. -May consider HCTZ on discharge  Body mass index is 27.49 kg/m.          DVT prophylaxis:  SCD after lower extremity venous Doppler  Code Status: Full code Family Communication: Updated patient's father at bedside. Level of care: Stepdown Status is: Observation The patient will require care spanning > 2 midnights and should be moved to inpatient because: Evaluation for loss of consciousness and seizure-like activity.    Final disposition: Likely home once medically cleared Consultants:  Neurology  Sch Meds:  Scheduled Meds:  Chlorhexidine Gluconate Cloth  6 each Topical Q0600   levETIRAcetam  750 mg Oral BID   sodium chloride flush  3 mL Intravenous Q12H   Continuous Infusions: PRN Meds:.acetaminophen **OR** acetaminophen, LORazepam, ondansetron **OR** ondansetron (ZOFRAN) IV, mouth rinse  Antimicrobials: Anti-infectives (From admission, onward)    None        I have personally reviewed the following labs and images: CBC: Recent Labs  Lab 07/24/22 1002 07/25/22 0250  WBC 4.6 3.6*  NEUTROABS 1.5*  --   HGB 12.0 11.3*  HCT 37.2 35.4*  MCV 87.7 88.9  PLT 211 197   BMP &GFR Recent Labs  Lab  07/24/22 1002 07/25/22 0250  NA 139 142  K 3.9 3.7  CL 108 109  CO2 24 23  GLUCOSE 112* 103*  BUN 10 11  CREATININE 0.70 0.68  CALCIUM 10.0 9.6  MG  --  1.9   Estimated Creatinine Clearance: 114.7 mL/min (by C-G formula based on SCr of 0.68 mg/dL). Liver & Pancreas: Recent Labs  Lab 07/24/22 1002 07/25/22 0250  AST 24 21  ALT 15 14  ALKPHOS 42 41  BILITOT 0.5 0.6  PROT 7.8 7.3  ALBUMIN 4.4 4.2   Recent Labs  Lab 07/24/22 1002  LIPASE 34   No results for input(s): "AMMONIA" in the last 168 hours. Diabetic: No results for input(s): "HGBA1C" in the last 72 hours. Recent Labs  Lab 07/24/22 1003 07/24/22 2028 07/25/22 0223 07/25/22 0450  GLUCAP 87 94 82 93   Cardiac Enzymes: No results for input(s): "CKTOTAL", "CKMB", "CKMBINDEX", "TROPONINI" in the last 168 hours. Recent Labs    11/29/21 1514 11/29/21 1610  PROBNP 12  5   Coagulation Profile: No results for input(s): "INR", "PROTIME" in the last 168 hours. Thyroid Function Tests: Recent Labs    2022/07/27 0250  TSH 2.346   Lipid Profile: No results for input(s): "CHOL", "HDL", "LDLCALC", "TRIG", "CHOLHDL", "LDLDIRECT" in the last 72 hours. Anemia Panel: No results for input(s): "VITAMINB12", "FOLATE", "FERRITIN", "TIBC", "IRON", "RETICCTPCT" in the last 72 hours. Urine analysis:    Component Value Date/Time   COLORURINE YELLOW 11/12/2015 [place] 11/12/2015 1245   LABSPEC >1.030 (H) 11/12/2015 1245   PHURINE 5.5 11/12/2015 1245   GLUCOSEU NEGATIVE 11/12/2015 1245   HGBUR MODERATE (A) 11/12/2015 1245   BILIRUBINUR NEGATIVE 11/12/2015 1245   KETONESUR NEGATIVE 11/12/2015 1245   PROTEINUR NEGATIVE 11/12/2015 1245   UROBILINOGEN 0.2 11/12/2015 1245   NITRITE NEGATIVE 11/12/2015 1245   LEUKOCYTESUR NEGATIVE 11/12/2015 1245   Sepsis Labs: Invalid input(s): "PROCALCITONIN", "LACTICIDVEN"  Microbiology: Recent Results (from the past 240 hour(s))  SARS Coronavirus 2 by RT PCR  (hospital order, performed in [facility] hospital lab) *cepheid single result test* Anterior Nasal Swab     Status: None   Collection Time: 07/24/22 10:04 AM   Specimen: Anterior Nasal Swab  Result Value Ref Range Status   SARS Coronavirus 2 by RT PCR NEGATIVE NEGATIVE Final    Comment: (NOTE) SARS-CoV-2 target nucleic acids are NOT DETECTED.  The SARS-CoV-2 RNA is generally detectable in upper and lower respiratory specimens during the acute phase of infection. The lowest concentration of SARS-CoV-2 viral copies this assay can detect is 250 copies / mL. A negative result does not preclude SARS-CoV-2 infection and should not be used as the sole basis for treatment or other patient management decisions.  A negative result may occur with improper specimen collection / handling, submission of specimen other than nasopharyngeal swab, presence of viral mutation(s) within the areas targeted by this assay, and inadequate number of viral copies (<250 copies / mL). A negative result must be combined with clinical observations, patient history, and epidemiological information.  Fact Sheet for Patients:   https://www.patel.info/  Fact Sheet for Healthcare Providers: https://hall.com/  This test is not yet approved or  cleared by the [place] FDA and has been authorized for detection and/or diagnosis of SARS-CoV-2 by FDA under an Emergency Use Authorization (EUA).  This EUA will remain in effect (meaning this test can be used) for the duration of the COVID-19 declaration under Section 564(b)(1) of the Act, 21 U.S.C. section 360bbb-3(b)(1), unless the authorization is terminated or revoked sooner.  Performed at [facility], [place] [address], [place], [place] [address]   MRSA Next Gen by PCR, Nasal     Status: None   Collection Time: July 27, 2022  2:18 AM   Specimen: Nasal Mucosa; Nasal Swab  Result Value Ref Range Status   MRSA  by PCR Next Gen NOT DETECTED NOT DETECTED Final    Comment: (NOTE) The GeneXpert MRSA Assay (FDA approved for NASAL specimens only), is one component of a comprehensive MRSA colonization surveillance program. It is not intended to diagnose MRSA infection nor to guide or monitor treatment for MRSA infections. Test performance is not FDA approved in patients less than 80 years old. Performed at [facility], [place] [address] [place], [place] [address]     Radiology Studies: EEG adult  Result Date: 27-Jul-2022 Lora Havens, MD     Jul 27, 2022  8:43 AM Patient Name: Maili Shutters MRN: [id number] Epilepsy Attending: Lora Havens Referring  Physician/Provider: Lennice Sites, DO Date: 07/24/2022 Duration: 27.18 mins Patient history: 26 year old female with syncope.  EEG Drolet for seizure. Level of alertness: Awake, asleep AEDs during EEG study: None Technical aspects: This EEG study was done with scalp electrodes positioned according to the 10-20 International system of electrode placement. Electrical activity was reviewed with band pass filter of 1-70Hz , sensitivity of 7 uV/mm, display speed of 6m/sec with a 60Hz  notched filter applied as appropriate. EEG data were recorded continuously and digitally stored.  Video monitoring was available and reviewed as appropriate. Description: The posterior dominant rhythm consists of 9-10 Hz activity of moderate voltage (25-35 uV) seen predominantly in posterior head regions, symmetric and reactive to eye opening and eye closing. Sleep was characterized by vertex waves, sleep spindles (12 to 14 Hz), maximal frontocentral region. Physiologic photic driving was seen during photic stimulation. Hyperventilation was not performed.   IMPRESSION: This study is within normal limits. No seizures or epileptiform discharges were seen throughout the recording. PLora Havens  ECHOCARDIOGRAM COMPLETE  Result Date: 07/24/2022    ECHOCARDIOGRAM  REPORT   Patient Name:   OLILLEE MOONEYHANDate of Exam: 07/24/2022 Medical Rec #:  [id number]       Height:       66.0 in Accession #:    [id number]      Weight:       169.4 lb Date of Birth:  101-05-1996      BSA:          1.864 m Patient Age:    25 years         BP:           130/82 mmHg Patient Gender: F                HR:           91 bpm. Exam Location:  Inpatient Procedure: 2D Echo, Cardiac Doppler, Color Doppler and 3D Echo Indications:    R55 Syncope  History:        Patient has no prior history of Echocardiogram examinations.                 Signs/Symptoms:Syncope. No prior cardiac history.  Sonographer:    TRoseanna RainbowRDCS Referring Phys: [id number] [place] Sonographer Comments: Suboptimal subcostal window. IMPRESSIONS  1. Left ventricular ejection fraction, by estimation, is 60 to 65%. The left ventricle has normal function. The left ventricle has no regional wall motion abnormalities. Left ventricular diastolic parameters were normal.  2. Right ventricular systolic function is normal. The right ventricular size is normal. There is normal pulmonary artery systolic pressure. The estimated right ventricular systolic pressure is 284.1mmHg.  3. The mitral valve is normal in structure. No evidence of mitral valve regurgitation. No evidence of mitral stenosis.  4. The aortic valve is tricuspid. Aortic valve regurgitation is not visualized. No aortic stenosis is present.  5. The inferior vena cava is normal in size with greater than 50% respiratory variability, suggesting right atrial pressure of 3 mmHg. FINDINGS  Left Ventricle: Left ventricular ejection fraction, by estimation, is 60 to 65%. The left ventricle has normal function. The left ventricle has no regional wall motion abnormalities. The left ventricular internal cavity size was normal in size. There is  no left ventricular hypertrophy. Left ventricular diastolic parameters were normal. Right Ventricle: The right ventricular size is normal. No  increase in right ventricular wall thickness. Right ventricular systolic function is normal. There  is normal pulmonary artery systolic pressure. The tricuspid regurgitant velocity is 2.46 m/s, and  with an assumed right atrial pressure of 3 mmHg, the estimated right ventricular systolic pressure is 65.7 mmHg. Left Atrium: Left atrial size was normal in size. Right Atrium: Right atrial size was normal in size. Pericardium: There is no evidence of pericardial effusion. Mitral Valve: The mitral valve is normal in structure. No evidence of mitral valve regurgitation. No evidence of mitral valve stenosis. Tricuspid Valve: The tricuspid valve is normal in structure. Tricuspid valve regurgitation is trivial. Aortic Valve: The aortic valve is tricuspid. Aortic valve regurgitation is not visualized. No aortic stenosis is present. Pulmonic Valve: The pulmonic valve was normal in structure. Pulmonic valve regurgitation is trivial. Aorta: The aortic root is normal in size and structure. Venous: The inferior vena cava is normal in size with greater than 50% respiratory variability, suggesting right atrial pressure of 3 mmHg. IAS/Shunts: No atrial level shunt detected by color flow Doppler.  LEFT VENTRICLE PLAX 2D LVIDd:         3.80 cm     Diastology LVIDs:         2.60 cm     LV e' medial:    9.03 cm/s LV PW:         1.25 cm     LV E/e' medial:  8.2 LV IVS:        1.00 cm     LV e' lateral:   13.40 cm/s LVOT diam:     1.90 cm     LV E/e' lateral: 5.6 LV SV:         69 LV SV Index:   37 LVOT Area:     2.84 cm                             3D Volume EF: LV Volumes (MOD)           3D EF:        61 % LV vol d, MOD A2C: 65.8 ml LV EDV:       90 ml LV vol d, MOD A4C: 67.8 ml LV ESV:       35 ml LV vol s, MOD A2C: 23.9 ml LV SV:        55 ml LV vol s, MOD A4C: 25.6 ml LV SV MOD A2C:     41.9 ml LV SV MOD A4C:     67.8 ml LV SV MOD BP:      43.0 ml RIGHT VENTRICLE             IVC RV S prime:     13.40 cm/s  IVC diam: 1.30 cm TAPSE  (M-mode): 2.5 cm LEFT ATRIUM           Index        RIGHT ATRIUM           Index LA diam:      2.40 cm 1.29 cm/m   RA Area:     12.00 cm LA Vol (A2C): 17.4 ml 9.34 ml/m   RA Volume:   24.70 ml  13.25 ml/m LA Vol (A4C): 25.9 ml 13.90 ml/m  AORTIC VALVE             PULMONIC VALVE LVOT Vmax:   133.00 cm/s PR End Diast Vel: 1.19 msec LVOT Vmean:  88.700 cm/s LVOT VTI:    0.243 m  AORTA Ao Root diam:  2.80 cm Ao Asc diam:  2.70 cm MITRAL VALVE               TRICUSPID VALVE MV Area (PHT): 4.21 cm    TR Peak grad:   24.2 mmHg MV Decel Time: 180 msec    TR Vmax:        246.00 cm/s MV E velocity: 74.40 cm/s MV A velocity: 68.60 cm/s  SHUNTS MV E/A ratio:  1.08        Systemic VTI:  0.24 m MV A Prime:    9.1 cm/s    Systemic Diam: 1.90 cm Dalton McleanMD Electronically signed by Franki Monte Signature Date/Time: 07/24/2022/5:24:43 PM    Final       Nathanie Ottley T. [place]  If 7PM-7AM, please contact night-coverage [email or web address] 07/25/2022, 12:02 PM

## 2022-07-25 NOTE — Evaluation (Signed)
Physical Therapy Evaluation Patient Details Name: Barbara Rice MRN: [id number] DOB: 10/13/1996 Today's Date: 07/25/2022  History of Present Illness  Patient is 26 y.o. female with PMH significant for lymphedema, HTN, sciatica and inguinal LAD (negative pathology) sent to ED for loss of consciousness.  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  She suddenly became tearful with some palpitation, SOB, lightheadedness. She was helped down to the floor by staff and lost consciousness for sometime.  Reportedly had similar symptoms in the past and seen by neurology.    Clinical Impression  Barbara Rice is 26 y.o. female admitted with above HPI and diagnosis. Patient is currently limited by functional impairments below (see PT problem list). Patient lives with her father and is independent at baseline. Currently pt required Min-Mod to sit up to EOB and Max assist to return to supine. Pt hypotonic/floppy once seated and unsafe to progress to standing. BP elevated and not orthostatic with supine to sit. Patient will benefit from continued skilled PT interventions to address impairments and progress independence with mobility, recommending AIR at this time give significance of functional deficits, high baseline independence, and excellent family support. Acute PT will follow and progress as able.        Recommendations for follow up therapy are one component of a multi-disciplinary discharge planning process, led by the attending physician.  Recommendations may be updated based on patient status, additional functional criteria and insurance authorization.  Follow Up Recommendations Acute inpatient rehab (3hours/day)      Assistance Recommended at Discharge Frequent or constant Supervision/Assistance  Patient can return home with the following  Two people to help with walking and/or transfers;Two people to help with bathing/dressing/bathroom;Assistance with cooking/housework;Assistance with feeding;Direct  supervision/assist for medications management;Assist for transportation;Help with stairs or ramp for entrance    Equipment Recommendations  (TBA)  Recommendations for Other Services  Rehab consult (pendign diagnosis)    Functional Status Assessment Patient has had a recent decline in their functional status and demonstrates the ability to make significant improvements in function in a reasonable and predictable amount of time.     Precautions / Restrictions Precautions Precautions: Fall Restrictions Weight Bearing Restrictions: No      Mobility  Bed Mobility Overal bed mobility: Needs Assistance Bed Mobility: Supine to Sit, Sit to Supine     Supine to sit: Min assist, Mod assist, HOB elevated Sit to supine: Max assist   General bed mobility comments: Pt initiated bringign LE's off EOB and reaching UE's toward bed rail. once on side pt uabel to raise trunk and mod assist needed. Pt collapsed against PT for support when seated EOB but still answering questions and opening eyes but pt clearly lethargic. Pt required Max Assist to return to supine.    Transfers                   General transfer comment: NT due to hypotonicity    Ambulation/Gait                  Stairs            Wheelchair Mobility    Modified Rankin (Stroke Patients Only)       Balance Overall balance assessment: Needs assistance Sitting-balance support: Feet supported, Bilateral upper extremity supported Sitting balance-Leahy Scale: Zero Sitting balance - Comments: reliant on external support of therapist  Pertinent Vitals/Pain      Home Living Family/patient expects to be discharged to:: Private residence Living Arrangements: Parent (dad) Available Help at Discharge: Family (brothers are going to come in to help) Type of Home: House Home Access: Level entry     Alternate Level Stairs-Number of Steps: 14 Home Layout:  Two level;Bed/bath upstairs        Prior Function Prior Level of Function : Independent/Modified Independent                     Hand Dominance   Dominant Hand: Right    Extremity/Trunk Assessment   Upper Extremity Assessment Upper Extremity Assessment: RUE deficits/detail;LUE deficits/detail;Generalized weakness RUE Deficits / Details: specific testing inconsistant: pt hypotonic and "floppy" with funcitonal tests but some resistance/muscle activation noted with AROM testing for elbow flex and grip strength. pt unable to hold UE up in shoulder flexion against gravity however able to attempt lifting forarms off lap under covers (against light resistance) pt also using iphone at start of session and initiated reaching across body to move to EOB but became hypotonic/floppy at EOB. RUE Sensation: decreased light touch (decreased sensation and reaction to noxious stimuli) RUE Coordination: decreased gross motor;decreased fine motor LUE Deficits / Details: specific testing inconsistant: pt hypotonic and "floppy" with funcitonal tests but some resistance/muscle activation noted with AROM testing for elbow flex and grip strength. pt unable to hold UE up in shoulder flexion against gravity however able to attempt lifting forarms off lap under covers (against light resistance) pt also using iphone at start of session and initiated reaching across body to move to EOB but became hypotonic/floppy at EOB. LUE Sensation: decreased light touch (decreased sensation and reaction to noxious stimuli) LUE Coordination: decreased gross motor;decreased fine motor    Lower Extremity Assessment Lower Extremity Assessment: RLE deficits/detail;LLE deficits/detail RLE Deficits / Details: Inconsistent: at arrival pt able to lift bil LE's up of bed, Lt>Rt. pt initiated moving to EOB and once seated hypotonic/floppy unable to complete ankle AROM and knee extension. pt able to activaiton dorsiflexion with tactile  stimuli of light touch at toes, and plantar flexion with cues to push. EOS pt unable to complete heel slide at all on bil LE despite having down SLR and heel slide at start. RLE Sensation: decreased light touch (decreased sensation and reaction to noxious stimuli) RLE Coordination: decreased gross motor;decreased fine motor LLE Deficits / Details: Inconsistent: at arrival pt able to lift bil LE's up of bed, Lt>Rt. pt initiated moving to EOB and once seated hypotonic/floppy unable to complete ankle AROM and knee extension. pt able to activaiton dorsiflexion with tactile stimuli of light touch at toes, and plantar flexion with cues to push. EOS pt unable to complete heel slide at all on bil LE despite having down SLR and heel slide at start. LLE Sensation: decreased light touch (decreased sensation and reaction to noxious stimuli) LLE Coordination: decreased gross motor;decreased fine motor    Cervical / Trunk Assessment Cervical / Trunk Assessment: Other exceptions;Normal Cervical / Trunk Exceptions: floppy in sitting  Communication   Communication: No difficulties  Cognition Arousal/Alertness: Awake/alert, Lethargic Behavior During Therapy: Flat affect Overall Cognitive Status: Difficult to assess                                 General Comments: pt alert when therapist arrived to room. pt        General Comments  Exercises     Assessment/Plan    PT Assessment Patient needs continued PT services  PT Problem List Decreased strength;Decreased range of motion;Decreased activity tolerance;Decreased balance;Decreased mobility;Decreased coordination;Decreased cognition;Decreased knowledge of use of DME;Decreased safety awareness;Decreased knowledge of precautions;Cardiopulmonary status limiting activity;Impaired sensation;Impaired tone       PT Treatment Interventions DME instruction;Gait training;Stair training;Functional mobility training;Therapeutic  activities;Therapeutic exercise;Balance training;Neuromuscular re-education;Patient/family education    PT Goals (Current goals can be found in the Care Plan section)  Acute Rehab PT Goals Patient Stated Goal: be able to walk and get back to normal PT Goal Formulation: With patient Time For Goal Achievement: 08/08/22 Potential to Achieve Goals: Good    Frequency Min 4X/week     Co-evaluation               AM-PAC PT "6 Clicks" Mobility  Outcome Measure Help needed turning from your back to your side while in a flat bed without using bedrails?: Total Help needed moving from lying on your back to sitting on the side of a flat bed without using bedrails?: Total Help needed moving to and from a bed to a chair (including a wheelchair)?: Total Help needed standing up from a chair using your arms (e.g., wheelchair or bedside chair)?: Total Help needed to walk in hospital room?: Total Help needed climbing 3-5 steps with a railing? : Total 6 Click Score: 6    End of Session Equipment Utilized During Treatment: Gait belt Activity Tolerance: Treatment limited secondary to medical complications (Comment);Patient limited by lethargy Patient left: in bed;with call bell/phone within reach;with bed alarm set;with family/visitor present Nurse Communication: Mobility status PT Visit Diagnosis: Muscle weakness (generalized) (M62.81);Difficulty in walking, not elsewhere classified (R26.2);Unsteadiness on feet (R26.81)    Time: 9030-0923 PT Time Calculation (min) (ACUTE ONLY): 32 min   Charges:   PT Evaluation $PT Eval Moderate Complexity: 1 Mod PT Treatments $Therapeutic Activity: 8-22 mins        Wynn Maudlin, DPT Acute Rehabilitation Services Office [phone] Pager [phone]  07/25/22 3:26 PM

## 2022-07-25 NOTE — Progress Notes (Signed)
Patient was walking in the hallway with staff at 0103. She walked about 49ft. Patient became unsteady on her feet then became extremely weak. Patient sat in the chair where she became unresponsive. We wheeled her back into her room and five staff members moved her from the bed to the chair. Rapid response RN notified. On call provider notified. Patient received a total of 2mg  of IV Ativan for seizure. Patient moved to ICU. Hand off given to receiving RN.

## 2022-07-25 NOTE — Procedures (Addendum)
Patient Name: Barbara Rice  MRN: [id number]  Epilepsy Attending: Charlsie Quest  Referring Physician/Provider: Dr Pearson Grippe Duration: 07/25/2022 0123 to 1302  Patient history: 25 year old female with seizure-like episode.  Cer[facility] EEG to evaluate for seizure.  Level of alertness: Awake, asleep  AEDs during EEG study: Ativan  Technical aspects:This EEG was obtained using a 10 lead EEG system positioned circumferentially without any parasagittal coverage (rapid EEG). Computer selected EEG is reviewed as  well as background features and all clinically significant events.  Description: The posterior dominant rhythm consists of 9-10 Hz activity of moderate voltage (25-35 uV) seen predominantly in posterior head regions, symmetric and reactive to eye opening and eye closing. Sleep was characterized by sleep spindles (12 to 14 Hz), maximal frontocentral region.  There is an excessive amount of 15 to 18 Hz beta activity distributed symmetrically and diffusely.  Hyperventilation and photic stimulation were not performed.     ABNORMALITY - Excessive beta, generalized  IMPRESSION: This limited rapid EEG is within normal limits. The excessive beta activity seen in the background is most likely due to the effect of benzodiazepine and is a benign EEG pattern. No seizures or epileptiform discharges were seen throughout the recording.  If suspicion for interictal activity remains a concern, a traditional 10-20 EEG can be considered.   [name]

## 2022-07-25 NOTE — Progress Notes (Signed)
Rapid Response Event Note   Reason for Call : seizure like activity   Initial Focused Assessment:  On arrival, RN staff trying to get pt back in bed. Pt was walking in the hallway, OTW back to the room pt  verbalized she was becoming weak. RN placed her in a near by chair and started wheeling her to the room. Per pr RN, pt went unresponsive in the hallway then showed seizure-like activity.  From TOA to 0132a pt remained unresponsive.  5397Q - pt became more responsive - followed a few commands  0133A - Shaking/jerking resumed 0137A - became more responsive and interactive  Interventions:  2L O2 applied (100% RA @ the time) 1mg  ativan given @ 0110a 1mg  ativan given @ 0145a  Plan of Care:  Continuous EEG on MRI ordered Moved to ICU/SD  Event Summary:  Family made aware (idris - brother)  MD Notified: , MD Call Time: [phone] Arrival TimePearson Grippe End Time: ongoing...  [id number], RN

## 2022-07-26 ENCOUNTER — Inpatient Hospital Stay (HOSPITAL_COMMUNITY): Payer: 59

## 2022-07-26 DIAGNOSIS — R4020 Unspecified coma: Secondary | ICD-10-CM | POA: Diagnosis not present

## 2022-07-26 LAB — CBC
HCT: 38.7 % (ref 36.0–46.0)
Hemoglobin: 11.8 g/dL — ABNORMAL LOW (ref 12.0–15.0)
MCH: 28.9 pg (ref 26.0–34.0)
MCHC: 30.5 g/dL (ref 30.0–36.0)
MCV: 94.6 fL (ref 80.0–100.0)
Platelets: 215 10*3/uL (ref 150–400)
RBC: 4.09 MIL/uL (ref 3.87–5.11)
RDW: 12.8 % (ref 11.5–15.5)
WBC: 4.9 10*3/uL (ref 4.0–10.5)
nRBC: 0 % (ref 0.0–0.2)

## 2022-07-26 LAB — GLUCOSE, CAPILLARY: Glucose-Capillary: 85 mg/dL (ref 70–99)

## 2022-07-26 LAB — ANA: Anti Nuclear Antibody (ANA): NEGATIVE

## 2022-07-26 MED ORDER — GUAIFENESIN 100 MG/5ML PO LIQD: 5.0000 mL | ORAL | Status: DC | PRN

## 2022-07-26 MED ORDER — FERROUS SULFATE 325 (65 FE) MG PO TABS
325.0000 mg | ORAL_TABLET | Freq: Every day | ORAL | Status: DC
  Administered 2022-07-28: 325 mg via ORAL
  Filled 2022-07-26 (×3): qty 1

## 2022-07-26 MED ORDER — DOCUSATE SODIUM 100 MG PO CAPS
100.0000 mg | ORAL_CAPSULE | Freq: Two times a day (BID) | ORAL | Status: DC
  Administered 2022-07-26 – 2022-07-29 (×4): 100 mg via ORAL
  Filled 2022-07-26 (×7): qty 1

## 2022-07-26 MED ORDER — FERUMOXYTOL INJECTION 510 MG/17 ML
510.0000 mg | Freq: Once | INTRAVENOUS | Status: AC
  Administered 2022-07-26: 510 mg via INTRAVENOUS
  Filled 2022-07-26: qty 17

## 2022-07-26 MED ORDER — IPRATROPIUM-ALBUTEROL 0.5-2.5 (3) MG/3ML IN SOLN: 3.0000 mL | RESPIRATORY_TRACT | Status: DC | PRN

## 2022-07-26 MED ORDER — HYDRALAZINE HCL 20 MG/ML IJ SOLN: 10.0000 mg | INTRAMUSCULAR | Status: DC | PRN

## 2022-07-26 MED ORDER — TRAZODONE HCL 50 MG PO TABS: 50.0000 mg | ORAL_TABLET | Freq: Every evening | ORAL | Status: DC | PRN

## 2022-07-26 MED ORDER — METOPROLOL TARTRATE 5 MG/5ML IV SOLN: 5.0000 mg | INTRAVENOUS | Status: DC | PRN

## 2022-07-26 MED ORDER — LORAZEPAM 2 MG/ML IJ SOLN
2.0000 mg | INTRAMUSCULAR | Status: DC | PRN
Start: 2022-07-26 — End: 2022-07-29
  Administered 2022-07-26: 2 mg via INTRAVENOUS
  Filled 2022-07-26: qty 1

## 2022-07-26 MED ORDER — SENNOSIDES-DOCUSATE SODIUM 8.6-50 MG PO TABS: 1.0000 | ORAL_TABLET | Freq: Every evening | ORAL | Status: DC | PRN

## 2022-07-26 NOTE — Progress Notes (Signed)
OT Cancellation Note  Patient Details Name: Barbara Rice MRN: [id number] DOB: 03-08-1996   Cancelled Treatment:    Reason Eval/Treat Not Completed: Other (comment). Patient transferred to [facility] prior to evaluation.  Santos Sollenberger L Ourania Hamler 07/26/2022, 12:25 PM

## 2022-07-26 NOTE — Progress Notes (Signed)
Heart rate ST 140 noted on monitor. RN went to assess patient and noticed twitching in eye and face, not following commands. Pupils 4 mm and responsive. Tachycardia lasted less that one minute and patient began to become more responsive after twitching stopped after approximately one minute witnessed. Amin MD notified. Orders placed to transfer to [facility].

## 2022-07-26 NOTE — Evaluation (Signed)
Occupational Therapy Evaluation Patient Details Name: Barbara Rice MRN: [id number] DOB: August 12, 1996 Today's Date: 07/26/2022   History of Present Illness Patient is 26 y.o. female with PMH significant for lymphedema, HTN, sciatica and inguinal LAD (negative pathology) sent to ED for loss of consciousness.  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  She suddenly became tearful with some palpitation, SOB, lightheadedness. She was helped down to the floor by staff and lost consciousness for sometime.  Reportedly had similar symptoms in the past and seen by neurology.   Clinical Impression   Pt admitted for concerns listed above. PTA pt reported that she was independent with all ADL's and IADL's, including working as a Psychologist, sport and exercise. At this time, pt presents with increased weakness, ataxia, balance deficits. She requires mod A +2 for transfers, functional mobility, and all ADL's. She is very wobbly sitting and standing, however motivated to continue working towards progressing her mobility. Recommending AIR to maximize her independence and safety. OT will follow acutely.     Recommendations for follow up therapy are one component of a multi-disciplinary discharge planning process, led by the attending physician.  Recommendations may be updated based on patient status, additional functional criteria and insurance authorization.   Follow Up Recommendations  Acute inpatient rehab (3hours/day)    Assistance Recommended at Discharge Frequent or constant Supervision/Assistance  Patient can return home with the following Two people to help with walking and/or transfers;Two people to help with bathing/dressing/bathroom;Assistance with cooking/housework;Assist for transportation;Help with stairs or ramp for entrance    Functional Status Assessment  Patient has had a recent decline in their functional status and demonstrates the ability to make significant improvements in function in a reasonable and predictable  amount of time.  Equipment Recommendations  BSC/3in1;Tub/shower seat;Wheelchair (measurements OT);Wheelchair cushion (measurements OT)    Recommendations for Other Services Rehab consult     Precautions / Restrictions Precautions Precautions: Fall Restrictions Weight Bearing Restrictions: No      Mobility Bed Mobility Overal bed mobility: Needs Assistance Bed Mobility: Supine to Sit, Sit to Supine     Supine to sit: Min assist Sit to supine: Min assist   General bed mobility comments: Mod A to assist trunk up to sitting, mod A to bring BLE back into bed    Transfers Overall transfer level: Needs assistance Equipment used: 2 person hand held assist Transfers: Sit to/from Stand Sit to Stand: Mod assist, +2 safety/equipment, +2 physical assistance           General transfer comment: Pt requriing mod A +2 to power up to standing and provide constant support. She was very unbalanced and swayed constantly in standing, knees presented with mild buckling motion, however she never fully lost her balance      Balance Overall balance assessment: Needs assistance Sitting-balance support: Feet supported, Bilateral upper extremity supported Sitting balance-Leahy Scale: Poor Sitting balance - Comments: Pt intermittently requiring up to mod A to maintain balance, othertimes min guard Postural control: Posterior lean, Right lateral lean Standing balance support: Bilateral upper extremity supported Standing balance-Leahy Scale: Poor Standing balance comment: Heavily relying on external support                           ADL either performed or assessed with clinical judgement   ADL Overall ADL's : Needs assistance/impaired Eating/Feeding: Minimal assistance;Sitting   Grooming: Minimal assistance;Sitting   Upper Body Bathing: Minimal assistance;Sitting   Lower Body Bathing: Maximal assistance;Sit  to/from stand;Sitting/lateral leans;+2 for safety/equipment   Upper  Body Dressing : Minimal assistance;Sitting   Lower Body Dressing: Maximal assistance;Sitting/lateral leans;Sit to/from stand;+2 for safety/equipment   Toilet Transfer: Maximal assistance;+2 for physical assistance;+2 for safety/equipment;Stand-pivot   Toileting- Clothing Manipulation and Hygiene: Maximal assistance;Sitting/lateral lean;Sit to/from stand;+2 for safety/equipment         General ADL Comments: Pt with ataxic movements/difficulty with motor planning, slowed response with movements in all extremities     Vision Baseline Vision/History: 0 No visual deficits Ability to See in Adequate Light: 0 Adequate Patient Visual Report: No change from baseline Vision Assessment?: Vision impaired- to be further tested in functional context Additional Comments: Pt eyes "rolling in the back of her head", difficulty maintaining gaze at objects, closing her eyes frequently     Perception     Praxis      Pertinent Vitals/Pain Pain Assessment Pain Assessment: No/denies pain     Hand Dominance Right   Extremity/Trunk Assessment Upper Extremity Assessment Upper Extremity Assessment: Generalized weakness (ROM limited, mild ataxia noted)   Lower Extremity Assessment Lower Extremity Assessment: Defer to PT evaluation   Cervical / Trunk Assessment Cervical / Trunk Assessment: Normal   Communication Communication Communication: No difficulties   Cognition Arousal/Alertness: Awake/alert Behavior During Therapy: Flat affect Overall Cognitive Status: Within Functional Limits for tasks assessed                                       General Comments  VSS on RA    Exercises     Shoulder Instructions      Home Living Family/patient expects to be discharged to:: Private residence Living Arrangements: Parent Available Help at Discharge: Family Type of Home: House Home Access: Level entry     Home Layout: Two level;Bed/bath upstairs Alternate Level  Stairs-Number of Steps: 14 Alternate Level Stairs-Rails: Right Bathroom Shower/Tub: Chief Strategy Officer: Standard     Home Equipment: None          Prior Functioning/Environment Prior Level of Function : Independent/Modified Independent             Mobility Comments: No AD needed ADLs Comments: Works as NT at [employer] List: Decreased strength;Decreased range of motion;Decreased activity tolerance;Impaired balance (sitting and/or standing);Decreased coordination;Decreased safety awareness;Impaired sensation;Impaired tone;Impaired UE functional use      OT Treatment/Interventions: Self-care/ADL training;Therapeutic exercise;Energy conservation;DME and/or AE instruction;Neuromuscular education;Therapeutic activities;Patient/family education;Balance training    OT Goals(Current goals can be found in the care plan section) Acute Rehab OT Goals Patient Stated Goal: To get her strength back OT Goal Formulation: With patient Time For Goal Achievement: 08/09/22 Potential to Achieve Goals: Good ADL Goals Pt Will Perform Grooming: with min guard assist;standing Pt Will Perform Lower Body Bathing: with min assist;sitting/lateral leans;sit to/from stand Pt Will Perform Lower Body Dressing: with min assist;sitting/lateral leans;sit to/from stand Pt Will Transfer to Toilet: with min guard assist;ambulating Pt Will Perform Toileting - Clothing Manipulation and hygiene: with min guard assist;sitting/lateral leans;sit to/from stand Pt/caregiver will Perform Home Exercise Program: Increased ROM;Increased strength;Both right and left upper extremity;With theraband;With theraputty;Independently  OT Frequency: Min 2X/week    Co-evaluation              AM-PAC OT "6 Clicks" Daily Activity     Outcome Measure Help from another person eating meals?: A Little Help from  another person taking care of personal grooming?: A Little Help from another person  toileting, which includes using toliet, bedpan, or urinal?: A Lot Help from another person bathing (including washing, rinsing, drying)?: A Lot Help from another person to put on and taking off regular upper body clothing?: A Little Help from another person to put on and taking off regular lower body clothing?: A Lot 6 Click Score: 15   End of Session Equipment Utilized During Treatment: Gait belt Nurse Communication: Mobility status  Activity Tolerance: Patient tolerated treatment well Patient left: in bed;with call bell/phone within reach;with family/visitor present  OT Visit Diagnosis: Unsteadiness on feet (R26.81);Other abnormalities of gait and mobility (R26.89);Muscle weakness (generalized) (M62.81);Other symptoms and signs involving the nervous system (R29.898)                Time: 1275-1700 OT Time Calculation (min): 28 min Charges:  OT General Charges $OT Visit: 1 Visit OT Evaluation $OT Eval Moderate Complexity: 1 Mod OT Treatments $Therapeutic Activity: 8-22 mins  Yoav Okane H., OTR/L Acute Rehabilitation  Khalani Novoa Elane Lakeasha Petion 07/26/2022, 4:59 PM

## 2022-07-26 NOTE — Progress Notes (Signed)
Patient with another episode of twitching, pupils responsive, shes responding more now, stated that she felt shaky inside   Notified Dr. Nelson Chimes, orders for Ativan 2 mg PRN

## 2022-07-26 NOTE — Progress Notes (Signed)
PT Cancellation Note  Patient Details Name: Barbara Rice MRN: [id number] DOB: 11/17/1996   Cancelled Treatment:    Reason Eval/Treat Not Completed: Other (comment) Pt transferring to [facility].   Barbara Rice 07/26/2022, 12:14 PM Barbara Rice PT, DPT Physical Therapist Acute Rehabilitation Services Preferred contact method: Secure Chat Weekend Pager Only: [phone] Office: [phone]

## 2022-07-26 NOTE — Progress Notes (Signed)
LTM EEG hooked up and running - no initial skin breakdown - push button tested - neuro notified. [facility] monitoring.  

## 2022-07-26 NOTE — Progress Notes (Signed)
HR up to 140s. MD notified. Ordered EKG. EKG completed.

## 2022-07-26 NOTE — Progress Notes (Signed)
PROGRESS NOTE    Barbara Rice  OZH:[id number] DOB: 1996/12/16 DOA: 07/24/2022 PCP: Orion Crook I, NP   Brief Narrative:  26 year old F with history of lymphedema, HTN, sciatica and inguinal LAD (negative pathology) sent to ED for loss of consciousness.  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  She suddenly became tearful with some palpitation, SOB, lightheadedness.  She was helped down to the floor by staff and lost consciousness for sometimes.  Reportedly had similar symptoms in the past and seen by neurology.  Initially patient was started on Keppra.  EEG, MRI brain unremarkable.  Keppra was discontinued there was concerns of some psychogenic seizures.  Neurology recommended continuous EEG therefore being transferred to [facility].   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  Thus far work-up including EEG, MRI brain has been negative.  Does have some stressors at home.  Had another episode this morning therefore neurology recommending continuous EEG.  Transfer patient to [facility].  Keppra discontinued, IV as needed Ativan ordered.  Mildly elevated D-dimer - No evidence of PE or DVT noted on lower extremity Dopplers and CTA.  Iron deficiency anemia - IV iron ordered.  P.o. starting tomorrow with Colace twice daily.  Essential hypertension - Currently not on any medication.  IV as needed Lopressor and hydralazine ordered.       DVT prophylaxis: SCDs Code Status: Full code Family Communication: Father at bedside  Maintain in hospital stay for seizure evaluation.  Transfer to [facility].   Subjective: Patient had a seizure type episode this morning according to nursing staff where she was staring, her heart rate was in 140s with subsided  without any medications. When I was evaluating the patient patient another episode where she was staring into space and closed her eyes.  No shaking was noted.  Patient was father was present at bedside.  She was given Ativan. When I spoke with patient's father he told me patient has a lot of stressors at home.    Examination:  General exam: Appears calm and comfortable  Respiratory system: Clear to auscultation. Respiratory effort normal. Cardiovascular system: S1 & S2 heard, RRR. No JVD, murmurs, rubs, gallops or clicks. No pedal edema. Gastrointestinal system: Abdomen is nondistended, soft and nontender. No organomegaly or masses felt. Normal bowel sounds heard. Central nervous system: Alert and oriented. No focal neurological deficits. Extremities: Symmetric 5 x 5 power. Skin: No rashes, lesions or ulcers Psychiatry: Judgement and insight appear normal. Mood & affect appropriate.     Objective: Vitals:   07/26/22 0900 07/26/22 1000 07/26/22 1100 07/26/22 1155  BP: 139/90 (!) 189/136 132/82   Pulse: 83 (!) 132 92   Resp: (!) 9 (!) 24 15   Temp:    98.5 F (36.9 C)  TempSrc:    Oral  SpO2: 100% 92% 100%   Weight:      Height:        Intake/Output Summary (Last 24 hours) at 07/26/2022 1157 Last data filed at 07/26/2022 0600 Gross per 24 hour  Intake 416.4 ml  Output 2325 ml  Net -1908.6 ml   Filed Weights   07/24/22 1700 07/25/22 0236 07/26/22 0647  Weight: 79.6 kg 79.6 kg 79.3 kg     Data Reviewed:  CBC: Recent Labs  Lab 07/24/22 1002 07/25/22 0250 07/26/22 0306  WBC 4.6 3.6* 4.9  NEUTROABS 1.5*  --   --   HGB 12.0 11.3* 11.8*  HCT 37.2 35.4* 38.7  MCV 87.7 88.9 94.6  PLT 211 197 215   Basic Metabolic Panel: Recent Labs  Lab 07/24/22 1002 07/25/22 0250  NA 139 142  K 3.9 3.7  CL 108 109  CO2 24 23  GLUCOSE 112* 103*  BUN 10 11  CREATININE 0.70 0.68  CALCIUM 10.0 9.6  MG  --  1.9   GFR: Estimated Creatinine Clearance: 114.6 mL/min (by C-G  formula based on SCr of 0.68 mg/dL). Liver Function Tests: Recent Labs  Lab 07/24/22 1002 07/25/22 0250  AST 24 21  ALT 15 14  ALKPHOS 42 41  BILITOT 0.5 0.6  PROT 7.8 7.3  ALBUMIN 4.4 4.2   Recent Labs  Lab 07/24/22 1002  LIPASE 34   No results for input(s): "AMMONIA" in the last 168 hours. Coagulation Profile: No results for input(s): "INR", "PROTIME" in the last 168 hours. Cardiac Enzymes: Recent Labs  Lab 07/25/22 1235  CKTOTAL 169   BNP (last 3 results) Recent Labs    11/29/21 1514 11/29/21 1610  PROBNP 12 5   HbA1C: No results for input(s): "HGBA1C" in the last 72 hours. CBG: Recent Labs  Lab 07/24/22 1003 07/24/22 2028 07/25/22 0223 07/25/22 0450 07/26/22 0645  GLUCAP 87 94 82 93 85   Lipid Profile: No results for input(s): "CHOL", "HDL", "LDLCALC", "TRIG", "CHOLHDL", "LDLDIRECT" in the last 72 hours. Thyroid Function Tests: Recent Labs    07/25/22 0250  TSH 2.346   Anemia Panel: Recent Labs    07/25/22 1235  VITAMINB12 722  FOLATE 23.1  FERRITIN 6*  TIBC 428  IRON 60  RETICCTPCT 1.4   Sepsis Labs: No results for input(s): "PROCALCITON", "LATICACIDVEN" in the last 168 hours.  Recent Results (from the past 240 hour(s))  SARS Coronavirus 2 by RT PCR (hospital order, performed in [facility] hospital lab) *cepheid single result test* Anterior Nasal Swab     Status: None   Collection Time: 07/24/22 10:04 AM   Specimen: Anterior Nasal Swab  Result Value Ref Range Status   SARS Coronavirus 2 by RT PCR NEGATIVE NEGATIVE Final    Comment: (NOTE) SARS-CoV-2 target nucleic acids are NOT DETECTED.  The SARS-CoV-2 RNA is generally detectable in upper and lower respiratory specimens during the acute phase of infection. The lowest concentration of SARS-CoV-2 viral copies this assay can detect is 250 copies / mL. A negative result does not preclude SARS-CoV-2 infection and should not be used as the sole basis for treatment or other patient  management decisions.  A negative result may occur with improper specimen collection / handling, submission of specimen other than nasopharyngeal swab, presence of viral mutation(s) within the areas targeted by this assay, and inadequate number of viral copies (<250 copies / mL). A negative result must be combined with clinical observations, patient history, and epidemiological information.  Fact Sheet for Patients:   RoadLapTop.co.zahttps://www.fda.gov/media/158405/download  Fact Sheet for Healthcare Providers: http://kim-miller.com/https://www.fda.gov/media/158404/download  This test is not yet approved or  cleared by the [place] FDA and has been authorized for detection and/or diagnosis of SARS-CoV-2 by FDA under an Emergency Use Authorization (EUA).  This EUA will remain in effect (meaning this test can be used) for the duration of the COVID-19 declaration under Section 564(b)(1) of the Act, 21 U.S.C. section 360bbb-3(b)(1), unless the authorization is  terminated or revoked sooner.  Performed at [facility], [address] [place], [place] [address]   MRSA Next Gen by PCR, Nasal     Status: None   Collection Time: 07/25/22  2:18 AM   Specimen: Nasal Mucosa; Nasal Swab  Result Value Ref Range Status   MRSA by PCR Next Gen NOT DETECTED NOT DETECTED Final    Comment: (NOTE) The GeneXpert MRSA Assay (FDA approved for NASAL specimens only), is one component of a comprehensive MRSA colonization surveillance program. It is not intended to diagnose MRSA infection nor to guide or monitor treatment for MRSA infections. Test performance is not FDA approved in patients less than 7 years old. Performed at [facility], [address] [place], [place] [address]          Radiology Studies: MR BRAIN W WO CONTRAST  Result Date: 07/25/2022 CLINICAL DATA:  Seizure-like activity. EXAM: MRI HEAD WITHOUT AND WITH CONTRAST TECHNIQUE: Multiplanar, multiecho pulse sequences of the  brain and surrounding structures were obtained without and with intravenous contrast. CONTRAST:  8mL GADAVIST GADOBUTROL 1 MMOL/ML IV SOLN COMPARISON:  CT head 1 day prior FINDINGS: Brain: There is no acute intracranial hemorrhage, extra-axial fluid collection, or acute infarct. Parenchymal volume is normal. The ventricles are normal in size. Gray-white differentiation is preserved. There is no structural or migration abnormality. The pattern of myelination is normal. The corpus callosum is normally formed. The pituitary and suprasellar region are normal. The hippocampi are normal in signal and architecture. There is no abnormal enhancement. There is no mass effect. There is no mass lesion. There is no midline shift. Vascular: Normal flow voids. Skull and upper cervical spine: Normal marrow signal. Sinuses/Orbits: There is mild mucosal thickening in the paranasal sinuses. The globes and orbits are unremarkable. Other: None. IMPRESSION: Normal brain MRI with no acute intracranial pathology or epileptogenic focus identified. Electronically Signed   By: Lesia Hausen M.D.   On: 07/25/2022 15:11   VAS [place] LOWER EXTREMITY VENOUS (DVT)  Result Date: 07/25/2022  Lower Venous DVT Study Patient Name:  KYA MAYFIELD  Date of Exam:   07/25/2022 Medical Rec #: [id number]         Accession #:    [id number] Date of Birth: March 15, 1996        Patient Gender: F Patient Age:   19 years Exam Location:  [facility] Procedure:      VAS [place] LOWER EXTREMITY VENOUS (DVT) Referring Phys: Alwyn Ren GONFA --------------------------------------------------------------------------------  Indications: Elevated Ddimer.  Risk Factors: None identified. Comparison Study: No prior studies. Performing Technologist: Chanda Busing RVT  Examination Guidelines: A complete evaluation includes B-mode imaging, spectral Doppler, color Doppler, and power Doppler as needed of all accessible portions of each vessel. Bilateral testing is considered an  integral part of a complete examination. Limited examinations for reoccurring indications may be performed as noted. The reflux portion of the exam is performed with the patient in reverse Trendelenburg.  +---------+---------------+---------+-----------+----------+--------------+ RIGHT    CompressibilityPhasicitySpontaneityPropertiesThrombus Aging +---------+---------------+---------+-----------+----------+--------------+ CFV      Full           Yes      Yes                                 +---------+---------------+---------+-----------+----------+--------------+ SFJ      Full                                                        +---------+---------------+---------+-----------+----------+--------------+  FV Prox  Full                                                        +---------+---------------+---------+-----------+----------+--------------+ FV Mid   Full                                                        +---------+---------------+---------+-----------+----------+--------------+ FV DistalFull                                                        +---------+---------------+---------+-----------+----------+--------------+ PFV      Full                                                        +---------+---------------+---------+-----------+----------+--------------+ POP      Full           Yes      Yes                                 +---------+---------------+---------+-----------+----------+--------------+ PTV      Full                                                        +---------+---------------+---------+-----------+----------+--------------+ PERO     Full                                                        +---------+---------------+---------+-----------+----------+--------------+   +---------+---------------+---------+-----------+----------+--------------+ LEFT     CompressibilityPhasicitySpontaneityPropertiesThrombus  Aging +---------+---------------+---------+-----------+----------+--------------+ CFV      Full           Yes      Yes                                 +---------+---------------+---------+-----------+----------+--------------+ SFJ      Full                                                        +---------+---------------+---------+-----------+----------+--------------+ FV Prox  Full                                                        +---------+---------------+---------+-----------+----------+--------------+  FV Mid   Full                                                        +---------+---------------+---------+-----------+----------+--------------+ FV DistalFull                                                        +---------+---------------+---------+-----------+----------+--------------+ PFV      Full                                                        +---------+---------------+---------+-----------+----------+--------------+ POP      Full           Yes      Yes                                 +---------+---------------+---------+-----------+----------+--------------+ PTV      Full                                                        +---------+---------------+---------+-----------+----------+--------------+ PERO     Full                                                        +---------+---------------+---------+-----------+----------+--------------+     Summary: RIGHT: - There is no evidence of deep vein thrombosis in the lower extremity.  - No cystic structure found in the popliteal fossa.  LEFT: - There is no evidence of deep vein thrombosis in the lower extremity.  - No cystic structure found in the popliteal fossa.  *See table(s) above for measurements and observations. Electronically signed by Heath Lark on 07/25/2022 at 2:15:33 PM.    Final    EEG adult  Result Date: 07/25/2022 Charlsie Quest, MD     07/25/2022  8:43 AM  Patient Name: Gissela Bloch MRN: [id number] Epilepsy Attending: Charlsie Quest Referring Physician/Provider: Virgina Norfolk, DO Date: 07/24/2022 Duration: 27.18 mins Patient history: 26 year old female with syncope.  EEG Drolet for seizure. Level of alertness: Awake, asleep AEDs during EEG study: None Technical aspects: This EEG study was done with scalp electrodes positioned according to the 10-20 International system of electrode placement. Electrical activity was reviewed with band pass filter of 1-70Hz , sensitivity of 7 uV/mm, display speed of 84mm/sec with a  notched filter applied as appropriate. EEG data were recorded continuously and digitally stored.  Video monitoring was available and reviewed as appropriate. Description: The posterior dominant rhythm consists of 9-10 Hz activity of moderate voltage (25-35 uV) seen predominantly in posterior head regions, symmetric and reactive to eye opening and eye closing. Sleep was characterized  by vertex waves, sleep spindles (12 to 14 Hz), maximal frontocentral region. Physiologic photic driving was seen during photic stimulation. Hyperventilation was not performed.   IMPRESSION: This study is within normal limits. No seizures or epileptiform discharges were seen throughout the recording. Charlsie Quest   ECHOCARDIOGRAM COMPLETE  Result Date: 07/24/2022    ECHOCARDIOGRAM REPORT   Patient Name:   BIANKA LIBERATI Date of Exam: 07/24/2022 Medical Rec #:  [id number]        Height:       66.0 in Accession #:    [id number]       Weight:       169.4 lb Date of Birth:  Feb 13, 1996       BSA:          1.864 m Patient Age:    25 years         BP:           130/82 mmHg Patient Gender: F                HR:           91 bpm. Exam Location:  Inpatient Procedure: 2D Echo, Cardiac Doppler, Color Doppler and 3D Echo Indications:    R55 Syncope  History:        Patient has no prior history of Echocardiogram examinations.                 Signs/Symptoms:Syncope. No prior  cardiac history.  Sonographer:    Sheralyn Boatman RDCS Referring Phys: [id number] DAVID MANUEL ORTIZ  Sonographer Comments: Suboptimal subcostal window. IMPRESSIONS  1. Left ventricular ejection fraction, by estimation, is 60 to 65%. The left ventricle has normal function. The left ventricle has no regional wall motion abnormalities. Left ventricular diastolic parameters were normal.  2. Right ventricular systolic function is normal. The right ventricular size is normal. There is normal pulmonary artery systolic pressure. The estimated right ventricular systolic pressure is 27.2 mmHg.  3. The mitral valve is normal in structure. No evidence of mitral valve regurgitation. No evidence of mitral stenosis.  4. The aortic valve is tricuspid. Aortic valve regurgitation is not visualized. No aortic stenosis is present.  5. The inferior vena cava is normal in size with greater than 50% respiratory variability, suggesting right atrial pressure of 3 mmHg. FINDINGS  Left Ventricle: Left ventricular ejection fraction, by estimation, is 60 to 65%. The left ventricle has normal function. The left ventricle has no regional wall motion abnormalities. The left ventricular internal cavity size was normal in size. There is  no left ventricular hypertrophy. Left ventricular diastolic parameters were normal. Right Ventricle: The right ventricular size is normal. No increase in right ventricular wall thickness. Right ventricular systolic function is normal. There is normal pulmonary artery systolic pressure. The tricuspid regurgitant velocity is 2.46 m/s, and  with an assumed right atrial pressure of 3 mmHg, the estimated right ventricular systolic pressure is 27.2 mmHg. Left At[facility]: Left atrial size was normal in size. Right At[facility]: Right atrial size was normal in size. Pericardium: There is no evidence of pericardial effusion. Mitral Valve: The mitral valve is normal in structure. No evidence of mitral valve regurgitation. No evidence of mitral  valve stenosis. Tricuspid Valve: The tricuspid valve is normal in structure. Tricuspid valve regurgitation is trivial. Aortic Valve: The aortic valve is tricuspid. Aortic valve regurgitation is not visualized. No aortic stenosis is present. Pulmonic Valve: The pulmonic valve was normal in structure. Pulmonic valve regurgitation is trivial.  Aorta: The aortic root is normal in size and structure. Venous: The inferior vena cava is normal in size with greater than 50% respiratory variability, suggesting right atrial pressure of 3 mmHg. IAS/Shunts: No atrial level shunt detected by color flow Doppler.  LEFT VENTRICLE PLAX 2D LVIDd:         3.80 cm     Diastology LVIDs:         2.60 cm     LV e' medial:    9.03 cm/s LV PW:         1.25 cm     LV E/e' medial:  8.2 LV IVS:        1.00 cm     LV e' lateral:   13.40 cm/s LVOT diam:     1.90 cm     LV E/e' lateral: 5.6 LV SV:         69 LV SV Index:   37 LVOT Area:     2.84 cm                             3D Volume EF: LV Volumes (MOD)           3D EF:        61 % LV vol d, MOD A2C: 65.8 ml LV EDV:       90 ml LV vol d, MOD A4C: 67.8 ml LV ESV:       35 ml LV vol s, MOD A2C: 23.9 ml LV SV:        55 ml LV vol s, MOD A4C: 25.6 ml LV SV MOD A2C:     41.9 ml LV SV MOD A4C:     67.8 ml LV SV MOD BP:      43.0 ml RIGHT VENTRICLE             IVC RV S prime:     13.40 cm/s  IVC diam: 1.30 cm TAPSE (M-mode): 2.5 cm LEFT ATRIUM           Index        RIGHT ATRIUM           Index LA diam:      2.40 cm 1.29 cm/m   RA Area:     12.00 cm LA Vol (A2C): 17.4 ml 9.34 ml/m   RA Volume:   24.70 ml  13.25 ml/m LA Vol (A4C): 25.9 ml 13.90 ml/m  AORTIC VALVE             PULMONIC VALVE LVOT Vmax:   133.00 cm/s PR End Diast Vel: 1.19 msec LVOT Vmean:  88.700 cm/s LVOT VTI:    0.243 m  AORTA Ao Root diam: 2.80 cm Ao Asc diam:  2.70 cm MITRAL VALVE               TRICUSPID VALVE MV Area (PHT): 4.21 cm    TR Peak grad:   24.2 mmHg MV Decel Time: 180 msec    TR Vmax:        246.00 cm/s MV E  velocity: 74.40 cm/s MV A velocity: 68.60 cm/s  SHUNTS MV E/A ratio:  1.08        Systemic VTI:  0.24 m MV A Prime:    9.1 cm/s    Systemic Diam: 1.90 cm Dalton McleanMD Electronically signed by Wilfred Lacy Signature Date/Time: 07/24/2022/5:24:43 PM    Final  Scheduled Meds:  Chlorhexidine Gluconate Cloth  6 each Topical Q0600   docusate sodium  100 mg Oral BID   ferrous sulfate  325 mg Oral BID WC   [START ON 07/27/2022] ferrous sulfate  325 mg Oral Q breakfast   sodium chloride flush  3 mL Intravenous Q12H   Continuous Infusions:   LOS: 1 day   Time spent= 35 mins    Anas Reister Joline Maxcy, MD [facility]  If 7PM-7AM, please contact night-coverage  07/26/2022, 11:57 AM

## 2022-07-27 DIAGNOSIS — R4020 Unspecified coma: Secondary | ICD-10-CM | POA: Diagnosis not present

## 2022-07-27 LAB — BASIC METABOLIC PANEL
Anion gap: 7 (ref 5–15)
BUN: 9 mg/dL (ref 6–20)
CO2: 24 mmol/L (ref 22–32)
Calcium: 9.7 mg/dL (ref 8.9–10.3)
Chloride: 106 mmol/L (ref 98–111)
Creatinine, Ser: 0.73 mg/dL (ref 0.44–1.00)
GFR, Estimated: >60 mL/min (ref >60)
Glucose, Bld: 98 mg/dL (ref 70–99)
Potassium: 4 mmol/L (ref 3.5–5.1)
Sodium: 137 mmol/L (ref 135–145)

## 2022-07-27 LAB — CBC
HCT: 34.3 % — ABNORMAL LOW (ref 36.0–46.0)
Hemoglobin: 11.4 g/dL — ABNORMAL LOW (ref 12.0–15.0)
MCH: 28.9 pg (ref 26.0–34.0)
MCHC: 33.2 g/dL (ref 30.0–36.0)
MCV: 86.8 fL (ref 80.0–100.0)
Platelets: 121 10*3/uL — ABNORMAL LOW (ref 150–400)
RBC: 3.95 MIL/uL (ref 3.87–5.11)
RDW: 12.5 % (ref 11.5–15.5)
WBC: 3.6 10*3/uL — ABNORMAL LOW (ref 4.0–10.5)
nRBC: 0 % (ref 0.0–0.2)

## 2022-07-27 LAB — GLUCOSE, CAPILLARY: Glucose-Capillary: 106 mg/dL — ABNORMAL HIGH (ref 70–99)

## 2022-07-27 LAB — MAGNESIUM: Magnesium: 1.9 mg/dL (ref 1.7–2.4)

## 2022-07-27 NOTE — Progress Notes (Signed)
Subjective: She had one event this morning, consisting of unresponsiveness while on the commode, no shaking was seen.  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  The episode that she had this morning was not completely characteristic, but was clearly nonepileptic.  I would favor to try and capture one of her shaking episodes, but given the description coupled with the fact that she had a clearly nonepileptic spell already, if no further episodes are captured, I think we do have enough information to strongly suspect psychogenic etiology.  I would not start antiepileptic therapy.  Recommendations: 1) continue LTM EEG  Ritta Slot, MD [facility] [phone]  If 7pm- 7am, please page neurology on call as listed in AMION.

## 2022-07-27 NOTE — Progress Notes (Signed)
Trouble shoot equip and made sure that atrium can remote in for overnight

## 2022-07-27 NOTE — Progress Notes (Signed)
PROGRESS NOTE  Barbara Rice  DOB: 1996-12-28  PCP: Orion Crook I, NP ACZ:[id number]  DOA: 07/24/2022  LOS: 2 days  Hospital Day: 4  Brief narrative: Barbara Rice is a 26 y.o. female with PMH significant for history of lymphedema, HTN, sciatica and inguinal lymphadenopathy (negative pathology) sent to ED for loss of consciousness.  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  She suddenly became tearful with some palpitation, SOB, lightheadedness.  She was helped down to the floor by staff and lost consciousness for sometimes.  Reportedly had similar symptoms in the past and seen by neurology.  Initially patient was started on Keppra.  EEG, MRI brain unremarkable.  Keppra was discontinued there was concerns of some psychogenic seizures. Neurology recommended continuous EEG and hence patient was transferred to [facility].  Subjective: Patient was seen and examined this morning.  Pleasant young African-American female.  Undergoing continuous EEG.  Her father was at bedside.  They are concerned about an event this morning which patient was sitting on the commode and went unresponsive without shaking.  They state that patient had episode of intermittent dizziness for few months at home.   Chart reviewed In last 24 hours, patient remains hemodynamically stable Labs this morning with unremarkable BMP, CBC showed WC count low at 3.6, hemoglobin low at 11.4  Assessment and plan: Seizures Highly suspicious for psychogenic seizures  Neurology consult appreciated.   Thus far work-up including EEG, MRI brain has been negative.  Does have some stressors at home.   Patient had an episode yesterday morning afterwards continuous EEG was started.  This morning also, patient had an episode of unresponsiveness while on the commode without shaking Barbara Rice.  Currently not on Keppra.  Ativan as needed Noted a plan from neurology to monitor for another 24 hours.   Mildly elevated D-dimer No  evidence of PE or DVT noted on lower extremity Dopplers and CTA.   Iron deficiency anemia IV iron ordered.  P.o. starting tomorrow with Colace twice daily.   Essential hypertension Currently not on any medication.  IV as needed Lopressor and hydralazine ordered.    Goals of care   Code Status: Full Code    Mobility: Encourage ambulation  Skin assessment:     Nutritional status:  Body mass index is 27.38 kg/m.          Diet:  Diet Order             Diet Heart Room service appropriate? Yes; Fluid consistency: Thin  Diet effective now                   DVT prophylaxis:  Place and maintain sequential compression device Start: 07/26/22 1201   Antimicrobials: None Fluid: None Consultants: Neurology Family Communication: Father at bedside  Status is: Inpatient  Continue in-hospital care because: Undergoing continuous EEG monitoring Level of care: Telemetry Medical   Dispo: The patient is from: Home              Anticipated d/c is to: Pending clinical course.  Hopefully home in 1 to 2 days              Patient currently is not medically stable to d/c.   Difficult to place patient No     Infusions:    Scheduled Meds:  docusate sodium  100 mg Oral BID   ferrous sulfate  325 mg Oral BID WC   ferrous sulfate  325 mg Oral Q  breakfast   sodium chloride flush  3 mL Intravenous Q12H    PRN meds: acetaminophen **OR** acetaminophen, guaiFENesin, hydrALAZINE, ipratropium-albuterol, LORazepam, metoprolol tartrate, ondansetron **OR** ondansetron (ZOFRAN) IV, mouth rinse, senna-docusate, traZODone   Antimicrobials: Anti-infectives (From admission, onward)    None       Objective: Vitals:   07/27/22 0730 07/27/22 1222  BP: 128/81 123/75  Pulse: 93 87  Resp: 16 16  Temp: 98.3 F (36.8 C) 98.7 F (37.1 C)  SpO2: 100% 99%    Intake/Output Summary (Last 24 hours) at 07/27/2022 1502 Last data filed at 07/27/2022 0420 Gross per 24 hour  Intake --   Output 1675 ml  Net -1675 ml   Filed Weights   07/24/22 1700 07/25/22 0236 07/26/22 0647  Weight: 79.6 kg 79.6 kg 79.3 kg   Weight change:  Body mass index is 27.38 kg/m.   Physical Exam: General exam: Pleasant, young African-American female.  Not in physical distress Skin: No rashes, lesions or ulcers. HEENT: Atraumatic, normocephalic, no obvious bleeding Lungs: Clear to auscultation bilaterally CVS: Regular rate and rhythm, no murmur GI/Abd soft, nontender, nondistended, bowel sound present CNS: Alert, awake abound x3 Psychiatry: Mood appropriate Extremities: No pedal edema, no calf tenderness  Data Review: I have personally reviewed the laboratory data and studies available.  F/u labs ordered Unresulted Labs (From admission, onward)     Start     Ordered   07/27/22 0500  Basic metabolic panel  Daily at 5am,   R     Question:  Specimen collection method  Answer:  Lab=Lab collect   07/26/22 0718   07/27/22 0500  CBC  Daily at 5am,   R     Question:  Specimen collection method  Answer:  Lab=Lab collect   07/26/22 0718   07/27/22 0500  Magnesium  Daily at 5am,   R     Question:  Specimen collection method  Answer:  Lab=Lab collect   07/26/22 6834            Signed, Lorin Glass, MD [facility] 07/27/2022

## 2022-07-27 NOTE — Progress Notes (Signed)
I was called into the room by the NT [place] for ambulation assistance. Upon entering the room Barbara Rice is sitting on the bedside commode, eyes closed, leaning forward being supported by NT.  We were able to stand and pivot patient. Once we got her back into the bed, her eyes were opening and she began to verbally respond.  Notified Dr. Pola Corn who came to assess the patient.

## 2022-07-27 NOTE — Procedures (Addendum)
/  Patient Name: Barbara Rice  MRN: [id number]  Epilepsy Attending: Charlsie Quest  Referring Physician/Provider: Dimple Nanas, MD  Duration: 07/26/2022 1636 to 07/27/2022 1636   Patient history: 26 year old female with seizure like episodes  EEG Drolet for seizure.   Level of alertness: Awake, asleep   AEDs during EEG study: None   Technical aspects: This EEG study was done with scalp electrodes positioned according to the 10-20 International system of electrode placement. Electrical activity was reviewed with band pass filter of 1-70Hz , sensitivity of 7 uV/mm, display speed of 48mm/sec with a 60Hz  notched filter applied as appropriate. EEG data were recorded continuously and digitally stored.  Video monitoring was available and reviewed as appropriate.   Description: The posterior dominant rhythm consists of 9-10 Hz activity of moderate voltage (25-35 uV) seen predominantly in posterior head regions, symmetric and reactive to eye opening and eye closing. Sleep was characterized by vertex waves, sleep spindles (12 to 14 Hz), maximal frontocentral region. Physiologic photic driving was seen during photic stimulation. Hyperventilation was not performed.     One event was recorded on 07/27/2022 at 0822. Patient was sitting on bedside commode with nursing tech standing next to her. She leaned to left side, eyes closed, not responding. Concomitant eeg showed normal posterior dominant rhythm.    IMPRESSION: This study is within normal limits. No seizures or epileptiform discharges were seen throughout the recording.  One event was recorded as described above without concomitant eeg change. This was NON-epileptic event.    Barbara Rice 0823

## 2022-07-28 DIAGNOSIS — R569 Unspecified convulsions: Secondary | ICD-10-CM | POA: Diagnosis not present

## 2022-07-28 DIAGNOSIS — D649 Anemia, unspecified: Secondary | ICD-10-CM | POA: Diagnosis not present

## 2022-07-28 DIAGNOSIS — R4020 Unspecified coma: Secondary | ICD-10-CM | POA: Diagnosis not present

## 2022-07-28 LAB — CBC
HCT: 35.4 % — ABNORMAL LOW (ref 36.0–46.0)
Hemoglobin: 11.3 g/dL — ABNORMAL LOW (ref 12.0–15.0)
MCH: 28.4 pg (ref 26.0–34.0)
MCHC: 31.9 g/dL (ref 30.0–36.0)
MCV: 88.9 fL (ref 80.0–100.0)
Platelets: 239 10*3/uL (ref 150–400)
RBC: 3.98 MIL/uL (ref 3.87–5.11)
RDW: 12.5 % (ref 11.5–15.5)
WBC: 3.8 10*3/uL — ABNORMAL LOW (ref 4.0–10.5)
nRBC: 0 % (ref 0.0–0.2)

## 2022-07-28 LAB — BASIC METABOLIC PANEL
Anion gap: 7 (ref 5–15)
BUN: 9 mg/dL (ref 6–20)
CO2: 26 mmol/L (ref 22–32)
Calcium: 9.5 mg/dL (ref 8.9–10.3)
Chloride: 105 mmol/L (ref 98–111)
Creatinine, Ser: 0.8 mg/dL (ref 0.44–1.00)
GFR, Estimated: >60 mL/min (ref >60)
Glucose, Bld: 93 mg/dL (ref 70–99)
Potassium: 3.7 mmol/L (ref 3.5–5.1)
Sodium: 138 mmol/L (ref 135–145)

## 2022-07-28 LAB — MAGNESIUM: Magnesium: 1.9 mg/dL (ref 1.7–2.4)

## 2022-07-28 NOTE — Progress Notes (Signed)
Neurology Progress Note  Subjective: She had one event yesterday morning consisting of unresponsiveness while on the commode, no shaking was seen. No further events noted overnight.   Exam: Vitals:   07/28/22 1131 07/28/22 1506  BP: 122/83 120/85  Pulse: 88 96  Resp: 19 20  Temp: 97.8 F (36.6 C) 97.7 F (36.5 C)  SpO2: 100% 100%   Gen: Sitting up in bed, family at bedside, in no acute distress Resp: non-labored breathing, no acute distress on room air  Neuro: MS: Awake, alert, interactive and appropriate, oriented throughout, follows commands. Speech intact without dysarthria or aphasia. Communicates appropriately.  CN: EOMI, face symmetric, head is midline, tongue protrudes midline, visual fields are full.  Motor: Moves all extremities well without weakness or asymmetry.  Sensory: Intact and symmetric to light touch throughout  EEG overnight 7/30 - 7/31: "This study is within normal limits. 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  The episode that she had 7/30 was not completely characteristic, but was clearly nonepileptic.  I would favor to try and capture one of her shaking episodes, but given the description coupled with the fact that she had a clearly nonepileptic spell already, if no further episodes are captured, I think we do have enough information to strongly suspect psychogenic etiology.  I would not start antiepileptic therapy.  Recommendations: 1) continue LTM EEG overnight until tomorrow  -- Lanae Boast, AGACNP-BC [facility] [phone]  Neurology Attending Attestation   I examined the patient and discussed plan with Ms. Toberman NP. Above note has been edited by me to reflect my findings and recommendations.   Bing Neighbors, MD [facility] [phone]   If 7pm- 7am, please page neurology on call as listed  in AMION.

## 2022-07-28 NOTE — Progress Notes (Signed)
LTM maint complete - no skin breakdown under:  Fp1 Fp2 Serviced C3 [facility] monitored, Event button test confirmed by [facility].

## 2022-07-28 NOTE — TOC Initial Note (Signed)
Transition of Care [facility]) - Initial/Assessment Note    Patient Details  Name: Barbara Rice MRN: [id number] Date of Birth: August 30, 1996  Transition of Care [facility] CM/SW Contact:    Kermit Balo, RN Phone Number: 07/28/2022, 4:05 PM  Clinical Narrative:                 Pt is from home with her father. Father is able to provide supervision at home. No DME and denies issues with home medications.  Pt goes to [facility] Patient [facility]. CM was able to get a follow up appt and placed it on the AVS.  Pt will have transportation home once medically ready.   Expected Discharge Plan: Home w Home Health Services Barriers to Discharge: Continued Medical Work up   Patient Goals and CMS Choice     Choice offered to / list presented to : Patient  Expected Discharge Plan and Services Expected Discharge Plan: Home w Home Health Services   Discharge Planning Services: CM Consult Post Acute Care Choice: Home Health Living arrangements for the past 2 months: Single Family Home                           HH Arranged: PT, OT   Date HH Agency Contacted: 07/28/22   Representative spoke with at [facility] Agency: Frances Furbish  Prior Living Arrangements/Services Living arrangements for the past 2 months: Single Family Home Lives with:: Parents Patient language and need for interpreter reviewed:: Yes Do you feel safe going back to the place where you live?: Yes            Criminal Activity/Legal Involvement Pertinent to Current Situation/Hospitalization: No - Comment as needed  Activities of Daily Living Home Assistive Devices/Equipment: Eyeglasses ADL Screening (condition at time of admission) Patient's cognitive ability adequate to safely complete daily activities?: Yes Is the patient deaf or have difficulty hearing?: No Does the patient have difficulty seeing, even when wearing glasses/contacts?: No Does the patient have difficulty concentrating, remembering, or making decisions?: No Patient  able to express need for assistance with ADLs?: Yes Does the patient have difficulty dressing or bathing?: No Independently performs ADLs?: Yes (appropriate for developmental age) Does the patient have difficulty walking or climbing stairs?: No Weakness of Legs: Both Weakness of Arms/Hands: Both  Permission Sought/Granted                  Emotional Assessment Appearance:: Appears stated age   Affect (typically observed): Accepting Orientation: : Oriented to Self, Oriented to Place, Oriented to  Time, Oriented to Situation   Psych Involvement: No (comment)  Admission diagnosis:  Syncope [R55] Seizure-like activity (HCC) [R56.9] Loss of consciousness (HCC) [R40.20] Patient Active Problem List   Diagnosis Date Noted   Seizure-like activity (HCC) 07/25/2022   Normocytic anemia 07/25/2022   Loss of consciousness (HCC) 07/24/2022   LGSIL on Pap smear of cervix 04/30/2022   Inguinal lymphadenopathy 01/26/2022   PCP:  Kathrynn Speed, NP Pharmacy:   [id number] - [place] - [address] [place]) [place] [address] Phone: [phone] Fax: [phone]     Social Determinants of Health (SDOH) Interventions    Readmission Risk Interventions     No data to display

## 2022-07-28 NOTE — Procedures (Signed)
Patient Name: Dezirae Service  MRN: [id number]  Epilepsy Attending: Charlsie Quest  Referring Physician/Provider: Dimple Nanas, MD  Duration: 07/27/2022 1636 to 07/28/2022 1636   Patient history: 26 year old female with seizure like episodes  EEG Drolet for seizure.   Level of alertness: Awake, asleep   AEDs during EEG study: None   Technical aspects: This EEG study was done with scalp electrodes positioned according to the 10-20 International system of electrode placement. Electrical activity was reviewed with band pass filter of 1-70Hz , sensitivity of 7 uV/mm, display speed of 69mm/sec with a 60Hz  notched filter applied as appropriate. EEG data were recorded continuously and digitally stored.  Video monitoring was available and reviewed as appropriate.   Description: The posterior dominant rhythm consists of 9-10 Hz activity of moderate voltage (25-35 uV) seen predominantly in posterior head regions, symmetric and reactive to eye opening and eye closing. Sleep was characterized by vertex waves, sleep spindles (12 to 14 Hz), maximal frontocentral region.    IMPRESSION: This study is within normal limits. No seizures or epileptiform discharges were seen throughout the recording.  Misty Rago 

## 2022-07-28 NOTE — Progress Notes (Signed)
IV team to bedside per consult. Spoke with patient, patient refuses IV access at this time. Pt reports discharge in AM, no seizure meds needed x2 days. IV Team comfortable with patient's decision at this time. RN notified

## 2022-07-28 NOTE — Progress Notes (Signed)
EEG maintenance complete. Patient had no skin breakdown.

## 2022-07-28 NOTE — Progress Notes (Signed)
Occupational Therapy Treatment Patient Details Name: Barbara Rice MRN: [id number] DOB: [birth year] Today's Date: 07/28/2022   History of present illness Patient is 26 y.o. female with PMH significant for lymphedema, HTN, sciatica and inguinal LAD (negative pathology) sent to ED for loss of consciousness.  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  She suddenly became tearful with some palpitation, SOB, lightheadedness. She was helped down to the floor by staff and lost consciousness for sometime.  Reportedly had similar symptoms in the past and seen by neurology.   OT comments  Pt progressing towards OT goals. Pt performing LB ADL, grooming while standing, toilet transfer, and toileting with min guard A. Pt reporting she feels stiff in her knees and neck, but is motivated to continue making progress with functional mobility. Pt with guarded movement during functional mobility with and without RW. Pt continues to present with decreased balance, strength, and endurance. Pt pleasant and conversational throughout; father present. Due to pt progress and strong desire to go home, undated discharge recommendation to [facility]. Will continue to follow acutely.    Recommendations for follow up therapy are one component of a multi-disciplinary discharge planning process, led by the attending physician.  Recommendations may be updated based on patient status, additional functional criteria and insurance authorization.    Follow Up Recommendations  Home health OT    Assistance Recommended at Discharge Frequent or constant Supervision/Assistance  Patient can return home with the following  A little help with walking and/or transfers;A little help with bathing/dressing/bathroom;Direct supervision/assist for medications management;Direct supervision/assist for financial management;Assistance with cooking/housework;Assist for transportation;Help with stairs or ramp for entrance   Equipment Recommendations  BSC/3in1     Recommendations for Other Services      Precautions / Restrictions Precautions Precautions: Fall Restrictions Weight Bearing Restrictions: No       Mobility Bed Mobility Overal bed mobility: Needs Assistance Bed Mobility: Supine to Sit, Sit to Supine     Supine to sit: Supervision Sit to supine: Supervision   General bed mobility comments: Supervision for safety. Pt with independent management of lines/leads    Transfers Overall transfer level: Needs assistance Equipment used: Rolling walker (2 wheels), None Transfers: Sit to/from Stand Sit to Stand: Min guard, From elevated surface           General transfer comment: Min guard A sit<>stand x3     Balance Overall balance assessment: Needs assistance Sitting-balance support: Feet supported, No upper extremity supported Sitting balance-Leahy Scale: Good Sitting balance - Comments: Donning socks sitting EOB   Standing balance support: Bilateral upper extremity supported, No upper extremity supported Standing balance-Leahy Scale: Fair Standing balance comment: Pt performing functional mobility without ADL to door in room with min guard A                           ADL either performed or assessed with clinical judgement   ADL Overall ADL's : Needs assistance/impaired     Grooming: Wash/dry hands;Standing;Min guard Grooming Details (indicate cue type and reason): Washing hands at sink with min guard A             Lower Body Dressing: Min guard;Sit to/from stand Lower Body Dressing Details (indicate cue type and reason): Pulling up underwear after going to restroom. Donning socks at beginning of session with no physical assistance. Toilet Transfer: Min guard;Rolling walker (2 wheels);Cueing for Teacher, English as a foreign language;Ambulation Toilet Transfer Details (indicate cue type and reason): Ambulatory toilet  transfer with min guard A for safety. Pt using RW on way to restroom and no AD on return to  EOB Toileting- Architect and Hygiene: Min guard;Sit to/from stand Toileting - Clothing Manipulation Details (indicate cue type and reason): Min guard A for safety     Functional mobility during ADLs: Min guard;Standard walker General ADL Comments: Pt with slow, cautious movement, and reporting her neck feels stiff during functional mobility.    Extremity/Trunk Assessment Upper Extremity Assessment Upper Extremity Assessment: Generalized weakness (RUE with less strength than LUE, however, nearly even)   Lower Extremity Assessment Lower Extremity Assessment: Defer to PT evaluation        Vision   Additional Comments: Pt with skill to scan at sink during session. Reporting her vision feels normal.   Perception     Praxis      Cognition Arousal/Alertness: Awake/alert Behavior During Therapy: Flat affect Overall Cognitive Status: Within Functional Limits for tasks assessed                                 General Comments: Pt oriented and alert, reporting she is ready to go home. Pt with flat facial affect, but verbally expressive. Pt demonstrating fair awareness of safety throughout session        Exercises      Shoulder Instructions       General Comments VSS    Pertinent Vitals/ Pain       Pain Assessment Pain Assessment: No/denies pain  Home Living                                          Prior Functioning/Environment              Frequency  Min 2X/week        Progress Toward Goals  OT Goals(current goals can now be found in the care plan section)  Progress towards OT goals: Progressing toward goals  Acute Rehab OT Goals Patient Stated Goal: To get her strength back and go home OT Goal Formulation: With patient Time For Goal Achievement: 08/09/22 Potential to Achieve Goals: Good ADL Goals Pt Will Perform Grooming: with modified independence;standing Pt Will Perform Lower Body Bathing: with modified  independence;sit to/from stand Pt Will Perform Lower Body Dressing: with modified independence;sit to/from stand Pt Will Transfer to Toilet: with modified independence;ambulating;regular height toilet Pt Will Perform Toileting - Clothing Manipulation and hygiene: with modified independence;sit to/from stand  Plan Discharge plan needs to be updated    Co-evaluation                 AM-PAC OT "6 Clicks" Daily Activity     Outcome Measure   Help from another person eating meals?: A Little Help from another person taking care of personal grooming?: A Little Help from another person toileting, which includes using toliet, bedpan, or urinal?: A Little Help from another person bathing (including washing, rinsing, drying)?: A Little Help from another person to put on and taking off regular upper body clothing?: A Little Help from another person to put on and taking off regular lower body clothing?: A Little 6 Click Score: 18    End of Session Equipment Utilized During Treatment: Gait belt;Rolling walker (2 wheels)  OT Visit Diagnosis: Unsteadiness on feet (R26.81);Other abnormalities of gait and mobility (R26.89);Muscle weakness (  generalized) (M62.81);Other symptoms and signs involving the nervous system (R29.898)   Activity Tolerance Patient tolerated treatment well   Patient Left in bed;with call bell/phone within reach;with family/visitor present   Nurse Communication Mobility status        Time: 2202-5427 OT Time Calculation (min): 17 min  Charges: OT General Charges $OT Visit: 1 Visit OT Treatments $Self Care/Home Management : 8-22 mins  Ladene Artist, OTR/L [facility] Acute Rehabilitation Office: [phone]   Drue Novel 07/28/2022, 12:19 PM

## 2022-07-28 NOTE — Progress Notes (Signed)
Inpatient Rehab Admissions Coordinator:   Per therapy recommendations, patient was screened for CIR candidacy by Jameca Chumley, MS, CCC-SLP. At this time, Pt. does not appear to demonstrate medical necessity to justify in hospital rehabilitation/CIR. will not pursue a rehab consult for this Pt.   Recommend other rehab venues to be pursued.  Please contact me with any questions.  Shemekia Patane, MS, CCC-SLP Rehab Admissions Coordinator  [phone] (celll) [phone] (office)   

## 2022-07-28 NOTE — Progress Notes (Signed)
PROGRESS NOTE  Barbara Rice  DOB: 08-25-1996  PCP: Orion Crook I, NP PJA:[id number]  DOA: 07/24/2022  LOS: 3 days  Hospital Day: 5  Brief narrative: Barbara Rice is a 26 y.o. female with PMH significant for history of lymphedema, HTN, sciatica and inguinal lymphadenopathy (negative pathology) sent to ED for loss of consciousness.  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  She suddenly became tearful with some palpitation, SOB, lightheadedness.  She was helped down to the floor by staff and lost consciousness for sometimes.  Reportedly had similar symptoms in the past and seen by neurology.  Initially patient was started on Keppra.  EEG, MRI brain unremarkable.  Keppra was discontinued there was concerns of some psychogenic seizures. Neurology recommended continuous EEG and hence patient was transferred to [facility].  Subjective: No further events.  Pt says she feels well.  No specific complaints.  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   Thus far work-up including continuous EEG, MRI brain has been negative.  Does have some stressors at home.   Plan is to complete LTM EEG monitoring as recommended by neurology service No antiepileptics have been started by neurology as of this time.    Mildly elevated D-dimer No evidence of PE or DVT noted on lower extremity Dopplers and CTA.   Iron deficiency anemia IV iron ordered. Continue colace twice daily.   Essential hypertension Currently not on any medication.  IV as needed Lopressor and hydralazine ordered.    Goals of care   Code Status: Full Code    Mobility: Encourage ambulation  Skin assessment:     Nutritional status:  Body mass index is 27.38 kg/m.          Diet:  Diet Order             Diet Heart Room service appropriate? Yes; Fluid consistency: Thin  Diet effective now                    DVT prophylaxis:  Place and maintain sequential compression device Start: 07/26/22 1201   Antimicrobials: None Fluid: None Consultants: Neurology Family Communication: Father at bedside  Status is: Inpatient  Continue in-hospital care because: Undergoing continuous EEG monitoring Level of care: Telemetry Medical   Dispo: The patient is from: Home              Anticipated d/c is to: Pending clinical course.  Hopefully home in 1 to 2 days              Patient currently is not medically stable to d/c.   Difficult to place patient No Infusions:    Scheduled Meds:  docusate sodium  100 mg Oral BID   ferrous sulfate  325 mg Oral BID WC   ferrous sulfate  325 mg Oral Q breakfast   sodium chloride flush  3 mL Intravenous Q12H    PRN meds: acetaminophen **OR** acetaminophen, guaiFENesin, hydrALAZINE, ipratropium-albuterol, LORazepam, metoprolol tartrate, ondansetron **OR** ondansetron (ZOFRAN) IV, mouth rinse, senna-docusate, traZODone   Antimicrobials: Anti-infectives (From admission, onward)    None       Objective: Vitals:   07/28/22 0500 07/28/22 0753  BP: 108/74 121/88  Pulse: 75 81  Resp: 18 17  Temp: 98.1 F (36.7 C) 98.1 F (36.7 C)  SpO2: 99% 100%    Intake/Output Summary (Last 24 hours) at 07/28/2022 1101 Last data filed at  07/28/2022 0759 Gross per 24 hour  Intake --  Output 1350 ml  Net -1350 ml   Filed Weights   07/24/22 1700 07/25/22 0236 07/26/22 0647  Weight: 79.6 kg 79.6 kg 79.3 kg   Weight change:  Body mass index is 27.38 kg/m.   Physical Exam: General exam: awake, alert, cooperative.   Skin: No rashes, lesions or ulcers. HEENT: Atraumatic, normocephalic, no obvious bleeding Lungs: Clear to auscultation bilaterally CVS: Regular rate and rhythm, no murmur GI/Abd soft, nontender, nondistended, bowel sound present CNS: Alert, awake abound x3 Psychiatry: Mood appropriate Extremities: No pedal edema, no calf tenderness  Data Review:  I have personally reviewed the laboratory data and studies available.  F/u labs ordered Unresulted Labs (From admission, onward)     Start     Ordered   07/27/22 0500  Basic metabolic panel  Daily at 5am,   R     Question:  Specimen collection method  Answer:  Lab=Lab collect   07/26/22 0718   07/27/22 0500  CBC  Daily at 5am,   R     Question:  Specimen collection method  Answer:  Lab=Lab collect   07/26/22 0718   07/27/22 0500  Magnesium  Daily at 5am,   R     Question:  Specimen collection method  Answer:  Lab=Lab collect   07/26/22 8341            Signed, Standley Dakins, MD [facility] 07/28/2022 How to contact the [facility] Attending or Consulting provider 7A - 7P or covering provider during after hours 7P -7A, for this patient?  Check the care team in [facility] and look for a) attending/consulting TR[facility] provider listed and b) the [facility] team listed Log into [email or web address] and use [facility]'s universal password to access. If you do not have the password, please contact the hospital operator. Locate the [facility] provider you are looking for under [facility] and page to a number that you can be directly reached. If you still have difficulty reaching the provider, please page the [facility] (Director on Call) for the Hospitalists listed on amion for assistance.

## 2022-07-28 NOTE — Progress Notes (Signed)
Physical Therapy Treatment Patient Details Name: Barbara Rice MRN: [id number] DOB: [birth year] Today's Date: 07/28/2022   History of Present Illness Patient is 26 y.o. female with PMH significant for lymphedema, HTN, sciatica and inguinal LAD (negative pathology) sent to ED for loss of consciousness.  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  She suddenly became tearful with some palpitation, SOB, lightheadedness. She was helped down to the floor by staff and lost consciousness for sometime.  Reportedly had similar symptoms in the past and seen by neurology.    PT Comments    Pt overall supervision for mobility today, ambulating in room without AD and limited by EEG monitoring. Pt fatigues quickly, but improving vs eval. PT encouraged OOB with family or staff assisting with lines/leads, pt agreeable. PT anticipates no follow up needs at this time.      Recommendations for follow up therapy are one component of a multi-disciplinary discharge planning process, led by the attending physician.  Recommendations may be updated based on patient status, additional functional criteria and insurance authorization.  Follow Up Recommendations  No PT follow up     Assistance Recommended at Discharge PRN  Patient can return home with the following     Equipment Recommendations  None recommended by PT    Recommendations for Other Services       Precautions / Restrictions Precautions Precautions: Fall Restrictions Weight Bearing Restrictions: No     Mobility  Bed Mobility Overal bed mobility: Needs Assistance Bed Mobility: Supine to Sit, Sit to Supine     Supine to sit: Supervision Sit to supine: Supervision        Transfers Overall transfer level: Needs assistance Equipment used: None Transfers: Sit to/from Stand Sit to Stand: Supervision           General transfer comment: for safety, slow to rise and steady    Ambulation/Gait Ambulation/Gait assistance: Supervision Gait  Distance (Feet): 90 Feet Assistive device: None Gait Pattern/deviations: Step-through pattern, Decreased stride length Gait velocity: decr     General Gait Details: slowed but steady gait, limited in repeated room laps given EEG   Stairs             Wheelchair Mobility    Modified Rankin (Stroke Patients Only)       Balance Overall balance assessment: Needs assistance Sitting-balance support: Feet supported, No upper extremity supported Sitting balance-Leahy Scale: Good     Standing balance support: Bilateral upper extremity supported, No upper extremity supported Standing balance-Leahy Scale: Good                 High Level Balance Comments: SLS x10 sec L, x8 sec R; increased time to bend forward and pick up object but no LOB            Cognition Arousal/Alertness: Awake/alert Behavior During Therapy: WFL for tasks assessed/performed Overall Cognitive Status: Within Functional Limits for tasks assessed                                          Exercises Other Exercises Other Exercises: STS x8 EOB    General Comments        Pertinent Vitals/Pain Pain Assessment Pain Assessment: Faces Faces Pain Scale: Hurts a little bit Pain Location: neck stiffness Pain Descriptors / Indicators: Discomfort Pain Intervention(s): Limited activity within patient's tolerance    Home Living  Prior Function            PT Goals (current goals can now be found in the care plan section) Acute Rehab PT Goals Patient Stated Goal: be able to walk and get back to normal PT Goal Formulation: With patient Time For Goal Achievement: 08/08/22 Potential to Achieve Goals: Good Progress towards PT goals: Progressing toward goals    Frequency    Min 3X/week      PT Plan Discharge plan needs to be updated    Co-evaluation              AM-PAC PT "6 Clicks" Mobility   Outcome Measure  Help needed  turning from your back to your side while in a flat bed without using bedrails?: None Help needed moving from lying on your back to sitting on the side of a flat bed without using bedrails?: None Help needed moving to and from a bed to a chair (including a wheelchair)?: None Help needed standing up from a chair using your arms (e.g., wheelchair or bedside chair)?: None Help needed to walk in hospital room?: None Help needed climbing 3-5 steps with a railing? : A Little 6 Click Score: 23    End of Session   Activity Tolerance: Patient tolerated treatment well Patient left: in bed;with call bell/phone within reach;with family/visitor present Nurse Communication: Mobility status PT Visit Diagnosis: Muscle weakness (generalized) (M62.81);Difficulty in walking, not elsewhere classified (R26.2);Unsteadiness on feet (R26.81)     Time: 5284-1324 PT Time Calculation (min) (ACUTE ONLY): 18 min  Charges:  $Therapeutic Activity: 8-22 mins                    Marye Round, PT DPT Acute Rehabilitation Services Pager [phone]  Office [phone]    [name] 07/28/2022, 4:19 PM

## 2022-07-29 ENCOUNTER — Ambulatory Visit: Payer: 59 | Admitting: Podiatry

## 2022-07-29 ENCOUNTER — Telehealth: Payer: Self-pay | Admitting: Podiatry

## 2022-07-29 DIAGNOSIS — D649 Anemia, unspecified: Secondary | ICD-10-CM | POA: Diagnosis not present

## 2022-07-29 DIAGNOSIS — R569 Unspecified convulsions: Secondary | ICD-10-CM | POA: Diagnosis not present

## 2022-07-29 DIAGNOSIS — R590 Localized enlarged lymph nodes: Secondary | ICD-10-CM | POA: Diagnosis not present

## 2022-07-29 LAB — GLUCOSE, CAPILLARY: Glucose-Capillary: 89 mg/dL (ref 70–99)

## 2022-07-29 NOTE — Progress Notes (Signed)
Occupational Therapy Treatment Patient Details Name: Barbara Rice MRN: [id number] DOB: Mar 17, 1996 Today's Date: 07/29/2022   History of present illness Patient is 26 y.o. female with PMH significant for lymphedema, HTN, sciatica and inguinal LAD (negative pathology) sent to ED for loss of consciousness.  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  She suddenly became tearful with some palpitation, SOB, lightheadedness. She was helped down to the floor by staff and lost consciousness for sometime.  Reportedly had similar symptoms in the past and seen by neurology.   OT comments  Pt with continued progression towards established OT goals. Pt performing ADL, transfers, and functional mobility at supervision level for safety. Pt observed with guarded and slow movement, and reporting neck stiffness. Pt performing toileting, tub/shower transfer, and LB dressing with supervision during session. Pt and father reporting she seems to be at or near baseline. Provided HEP for continued strengthening as pt continues to return to typical daily routine upon discharge. Updated discharge recommendation to no OT follow up.     Recommendations for follow up therapy are one component of a multi-disciplinary discharge planning process, led by the attending physician.  Recommendations may be updated based on patient status, additional functional criteria and insurance authorization.    Follow Up Recommendations  No OT follow up    Assistance Recommended at Discharge Intermittent Supervision/Assistance  Patient can return home with the following  Direct supervision/assist for financial management;Direct supervision/assist for medications management;Assist for transportation;Help with stairs or ramp for entrance   Equipment Recommendations  None recommended by OT (Pt and father reporting that they can locate a shower seat independently, and they do not want one)    Recommendations for Other Services      Precautions /  Restrictions Precautions Precautions: Fall Restrictions Weight Bearing Restrictions: No       Mobility Bed Mobility Overal bed mobility: Modified Independent             General bed mobility comments: Mod I for increased time    Transfers Overall transfer level: Needs assistance Equipment used: None Transfers: Sit to/from Stand Sit to Stand: Supervision           General transfer comment: Supervision for safety. Slow to rise and stedy     Balance Overall balance assessment: Needs assistance Sitting-balance support: Feet supported, No upper extremity supported Sitting balance-Leahy Scale: Good Sitting balance - Comments: Donning socks sitting EOB   Standing balance support: No upper extremity supported, During functional activity Standing balance-Leahy Scale: Good Standing balance comment: Performing shower transfer with supervision                           ADL either performed or assessed with clinical judgement   ADL Overall ADL's : Needs assistance/impaired                         Toilet Transfer: Supervision/safety;Ambulation;Regular Teacher, adult education Details (indicate cue type and reason): Ambulatory transfer with distant supervisio Toileting- Clothing Manipulation and Hygiene: Supervision/safety;Sit to/from stand Toileting - Clothing Manipulation Details (indicate cue type and reason): Supervision for safety Tub/ Shower Transfer: Tub transfer;Supervision/safety;Ambulation Tub/Shower Transfer Details (indicate cue type and reason): Ambulation to rehab gym and transfer in and out of shower with supervision for safety. Functional mobility during ADLs: Supervision/safety General ADL Comments: Pt with slow, cautious movement, and reporting her neck feels stiff during functional mobility. Pt with good safety awareness  during mobility.    Extremity/Trunk Assessment Upper Extremity Assessment Upper Extremity Assessment: Generalized  weakness   Lower Extremity Assessment Lower Extremity Assessment: Defer to PT evaluation        Vision   Vision Assessment?: No apparent visual deficits Additional Comments: Pt reading HEP handout during session, and scanning environment during ADL   Perception Perception Perception: Not tested   Praxis Praxis Praxis: Intact    Cognition Arousal/Alertness: Awake/alert Behavior During Therapy: WFL for tasks assessed/performed Overall Cognitive Status: Within Functional Limits for tasks assessed                                 General Comments: Pt oriented and alert, reporting she is ready to go home. Pt with flat facial affect, but verbally expressive. Pt demonstrating awareness of safety throughout session, verbalizing why she is making certain decisions        Exercises Exercises: General Upper Extremity, Hand exercises General Exercises - Upper Extremity Elbow Flexion: AROM, 10 reps, Both, Seated, Theraband Theraband Level (Elbow Flexion): Level 2 (Red) Hand Exercises Digit Composite Flexion: 10 reps, Both, AROM (red theraputty) Other Exercises Other Exercises: Lumbrical exercises with straight fingers with theraputty; Shoulder external rotation with theraband    Shoulder Instructions       General Comments VSS, father present    Pertinent Vitals/ Pain       Pain Assessment Pain Assessment: Faces Faces Pain Scale: Hurts a little bit Pain Location: neck stiffness Pain Descriptors / Indicators: Discomfort Pain Intervention(s): Limited activity within patient's tolerance, Monitored during session  Home Living                                          Prior Functioning/Environment              Frequency           Progress Toward Goals  OT Goals(current goals can now be found in the care plan section)  Progress towards OT goals: Progressing toward goals  Acute Rehab OT Goals Patient Stated Goal: Go home OT Goal  Formulation: With patient Time For Goal Achievement: 08/09/22 Potential to Achieve Goals: Good ADL Goals Pt Will Perform Grooming: with modified independence;standing Pt Will Perform Lower Body Bathing: with modified independence;sit to/from stand Pt Will Perform Lower Body Dressing: with modified independence;sit to/from stand Pt Will Transfer to Toilet: with modified independence;ambulating;regular height toilet Pt Will Perform Toileting - Clothing Manipulation and hygiene: with modified independence;sit to/from stand Pt/caregiver will Perform Home Exercise Program: Increased ROM;Increased strength;Both right and left upper extremity;With theraband;With theraputty;Independently  Plan Discharge plan needs to be updated    Co-evaluation                 AM-PAC OT "6 Clicks" Daily Activity     Outcome Measure   Help from another person eating meals?: None Help from another person taking care of personal grooming?: A Little Help from another person toileting, which includes using toliet, bedpan, or urinal?: A Little Help from another person bathing (including washing, rinsing, drying)?: A Little Help from another person to put on and taking off regular upper body clothing?: A Little Help from another person to put on and taking off regular lower body clothing?: A Little 6 Click Score: 19    End of Session Equipment Utilized During  Treatment: Gait belt  OT Visit Diagnosis: Unsteadiness on feet (R26.81);Other abnormalities of gait and mobility (R26.89);Muscle weakness (generalized) (M62.81);Other symptoms and signs involving the nervous system (R29.898)   Activity Tolerance Patient tolerated treatment well   Patient Left in bed;with call bell/phone within reach   Nurse Communication Mobility status        Time: 1884-1660 OT Time Calculation (min): 17 min  Charges: OT General Charges $OT Visit: 1 Visit OT Treatments $Self Care/Home Management : 8-22 mins  Ladene Artist, OTR/L [facility] Acute Rehabilitation Office: [phone]   Drue Novel 07/29/2022, 1:29 PM

## 2022-07-29 NOTE — Discharge Summary (Signed)
Physician Discharge Summary   Patient: Barbara Rice MRN: [id number] DOB: February 09, 1996  Admit date:     07/24/2022  Discharge date: 07/29/22  Discharge Physician: Tyrone Nine   PCP: Orion Crook I, NP   Recommendations at discharge:  Follow up with PCP for ongoing chronic medical care.  Note work up while hospitalized revealed no evidence of seizure. Suspicion for psychogenic episodes is high, and no AEDs are prescribed at discharge. Patient to remain on seizure precautions (including driving restrictions) at discharge.  Discharge Diagnoses: Principal Problem:   Loss of consciousness (HCC) Active Problems:   Inguinal lymphadenopathy   Seizure-like activity (HCC)   Normocytic anemia  Hospital Course: Barbara Rice is a 26 y.o. female with PMH significant for history of lymphedema, HTN, sciatica and inguinal lymphadenopathy (negative pathology) sent to ED for loss of consciousness.  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  She suddenly became tearful with some palpitation, SOB, lightheadedness.  She was helped down to the floor by staff and lost consciousness for sometimes.  Reportedly had similar symptoms in the past and seen by neurology.  Initially patient was started on Keppra.  EEG, MRI brain unremarkable.  Keppra was discontinued there was concerns of some psychogenic seizures. 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  *** Thus far work-up including continuous EEG, MRI brain has been negative.  Does have some stressors at home.   Plan is to complete LTM EEG monitoring as recommended by neurology service No antiepileptics have been started by neurology as of this time.    Mildly elevated D-dimer No evidence of PE or DVT noted on lower extremity Dopplers and CTA.   Iron  deficiency anemia IV iron ordered. Continue colace twice daily.   Essential hypertension Currently not on any medication.  IV as needed Lopressor and hydralazine ordered.  Consultants: Neurology Procedures performed: Continuous EEG  Disposition: Home Diet recommendation:  Regular diet DISCHARGE MEDICATION: Allergies as of 07/29/2022       Reactions   Pork-derived Products Other (See Comments)   Religion-based; does not consume pork-products        Medication List     TAKE these medications    acetaminophen-codeine 300-30 MG tablet Commonly known as: TYLENOL #3 Take 1 tablet by mouth every 6 (six) hours as needed for moderate pain.   amLODipine 10 MG tablet Commonly known as: NORVASC Take 1 tablet (10 mg total) by mouth daily.   cyclobenzaprine 5 MG tablet Commonly known as: FLEXERIL Take 1 tablet (5 mg total) by mouth 3 (three) times daily as needed for muscle spasms.   ibuprofen 200 MG tablet Commonly known as: ADVIL Take 400 mg by mouth every 6 (six) hours as needed for mild pain.   meloxicam 15 MG tablet Commonly known as: Mobic Take 1 tablet (15 mg total) by mouth daily.   Sodium Fluoride 0.55 (0.25 F) MG/DROP Soln 5 mLs by Mouth Rinse route at bedtime.        Follow-up Information     [facility] Follow up on 08/08/2022.   Specialty: Internal Medicine Why: Your appointment is at 9:20 am. Please arrive early and bring a picture ID and your current medications Contact information: [address] [place] [address] [phone]        Care, [facility]  Health Follow up.   Specialty: Home Health Services Why: The home health agency will contact you for the first home visit. Contact information: [address] [place] [address] [phone]                Discharge Exam: Filed Weights   07/25/22 0236 07/26/22 0647 07/29/22 0455  Weight: 79.6 kg 79.3 kg 81.7 kg  BP 122/75 (BP Location:  Left Arm)   Pulse 89   Temp 98.5 F (36.9 C) (Oral)   Resp 18   Ht 5\' 7"  (1.702 m)   Wt 81.7 kg   SpO2 100%   BMI 28.21 kg/m   Well-appearing female in no distress Clear, nonlabored RRR, no leg edema Alert, oriented, interactive, no focal deficits  Condition at discharge: stable  The results of significant diagnostics from this hospitalization (including imaging, microbiology, ancillary and laboratory) are listed below for reference.   Imaging Studies: Overnight EEG with video  Result Date: 07/27/2022 07/29/2022, MD     07/29/2022 10:25 AM /Patient Name: Barbara Rice MRN: [name] Epilepsy Attending: [id number] Referring Physician/Provider: Charlsie Quest, MD Duration: 07/26/2022 1636 to 07/27/2022 1636  Patient history: 26 year old female with seizure like episodes  EEG Drolet for seizure.  Level of alertness: Awake, asleep  AEDs during EEG study: None  Technical aspects: This EEG study was done with scalp electrodes positioned according to the 10-20 International system of electrode placement. Electrical activity was reviewed with band pass filter of 1-70Hz , sensitivity of 7 uV/mm, display speed of 27mm/sec with a 60Hz  notched filter applied as appropriate. EEG data were recorded continuously and digitally stored.  Video monitoring was available and reviewed as appropriate.  Description: The posterior dominant rhythm consists of 9-10 Hz activity of moderate voltage (25-35 uV) seen predominantly in posterior head regions, symmetric and reactive to eye opening and eye closing. Sleep was characterized by vertex waves, sleep spindles (12 to 14 Hz), maximal frontocentral region. One event was recorded on 07/27/2022 at 0822. Patient was sitting on bedside commode with nursing tech standing next to her. She leaned to left side, eyes closed, not responding. Concomitant eeg showed normal posterior dominant rhythm.  IMPRESSION: This study is within normal limits. No seizures or  epileptiform discharges were seen throughout the recording. One event was recorded as described above without concomitant eeg change. This was NON-epileptic event.  07/29/2022   MR BRAIN W WO CONTRAST  Result Date: 07/25/2022 CLINICAL DATA:  Seizure-like activity. EXAM: MRI HEAD WITHOUT AND WITH CONTRAST TECHNIQUE: Multiplanar, multiecho pulse sequences of the brain and surrounding structures were obtained without and with intravenous contrast. CONTRAST:  10mL GADAVIST GADOBUTROL 1 MMOL/ML IV SOLN COMPARISON:  CT head 1 day prior FINDINGS: Brain: There is no acute intracranial hemorrhage, extra-axial fluid collection, or acute infarct. Parenchymal volume is normal. The ventricles are normal in size. Gray-white differentiation is preserved. There is no structural or migration abnormality. The pattern of myelination is normal. The corpus callosum is normally formed. The pituitary and suprasellar region are normal. The hippocampi are normal in signal and architecture. There is no abnormal enhancement. There is no mass effect. There is no mass lesion. There is no midline shift. Vascular: Normal flow voids. Skull and upper cervical spine: Normal marrow signal. Sinuses/Orbits: There is mild mucosal thickening in the paranasal sinuses. The globes and orbits are unremarkable. Other: None. IMPRESSION: Normal brain MRI with no acute intracranial pathology or epileptogenic focus identified. Electronically Signed  By: Lesia Hausen M.D.   On: 07/25/2022 15:11   VAS [place] LOWER EXTREMITY VENOUS (DVT)  Result Date: 07/25/2022  Lower Venous DVT Study Patient Name:  Barbara Rice  Date of Exam:   07/25/2022 Medical Rec #: [id number]         Accession #:    [id number] Date of Birth: [birth year]        Patient Gender: F Patient Age:   9 years Exam Location:  [facility] Procedure:      VAS [place] LOWER EXTREMITY VENOUS (DVT) Referring Phys: Alwyn Ren GONFA  --------------------------------------------------------------------------------  Indications: Elevated Ddimer.  Risk Factors: None identified. Comparison Study: No prior studies. Performing Technologist: Chanda Busing RVT  Examination Guidelines: A complete evaluation includes B-mode imaging, spectral Doppler, color Doppler, and power Doppler as needed of all accessible portions of each vessel. Bilateral testing is considered an integral part of a complete examination. Limited examinations for reoccurring indications may be performed as noted. The reflux portion of the exam is performed with the patient in reverse Trendelenburg.  +---------+---------------+---------+-----------+----------+--------------+ RIGHT    CompressibilityPhasicitySpontaneityPropertiesThrombus Aging +---------+---------------+---------+-----------+----------+--------------+ CFV      Full           Yes      Yes                                 +---------+---------------+---------+-----------+----------+--------------+ SFJ      Full                                                        +---------+---------------+---------+-----------+----------+--------------+ FV Prox  Full                                                        +---------+---------------+---------+-----------+----------+--------------+ FV Mid   Full                                                        +---------+---------------+---------+-----------+----------+--------------+ FV DistalFull                                                        +---------+---------------+---------+-----------+----------+--------------+ PFV      Full                                                        +---------+---------------+---------+-----------+----------+--------------+ POP      Full           Yes      Yes                                 +---------+---------------+---------+-----------+----------+--------------+  PTV      Full                                                         +---------+---------------+---------+-----------+----------+--------------+ PERO     Full                                                        +---------+---------------+---------+-----------+----------+--------------+   +---------+---------------+---------+-----------+----------+--------------+ LEFT     CompressibilityPhasicitySpontaneityPropertiesThrombus Aging +---------+---------------+---------+-----------+----------+--------------+ CFV      Full           Yes      Yes                                 +---------+---------------+---------+-----------+----------+--------------+ SFJ      Full                                                        +---------+---------------+---------+-----------+----------+--------------+ FV Prox  Full                                                        +---------+---------------+---------+-----------+----------+--------------+ FV Mid   Full                                                        +---------+---------------+---------+-----------+----------+--------------+ FV DistalFull                                                        +---------+---------------+---------+-----------+----------+--------------+ PFV      Full                                                        +---------+---------------+---------+-----------+----------+--------------+ POP      Full           Yes      Yes                                 +---------+---------------+---------+-----------+----------+--------------+ PTV      Full                                                        +---------+---------------+---------+-----------+----------+--------------+  PERO     Full                                                        +---------+---------------+---------+-----------+----------+--------------+     Summary: RIGHT: - There is no evidence of deep vein  thrombosis in the lower extremity.  - No cystic structure found in the popliteal fossa.  LEFT: - There is no evidence of deep vein thrombosis in the lower extremity.  - No cystic structure found in the popliteal fossa.  *See table(s) above for measurements and observations. Electronically signed by Heath Lark on 07/25/2022 at 2:15:33 PM.    Final    EEG adult  Result Date: 07/25/2022 Charlsie Quest, MD     07/25/2022  8:43 AM Patient Name: Barbara Rice MRN: [id number] Epilepsy Attending: Charlsie Quest Referring Physician/Provider: Virgina Norfolk, DO Date: 07/24/2022 Duration: 27.18 mins Patient history: 26 year old female with syncope.  EEG Drolet for seizure. Level of alertness: Awake, asleep AEDs during EEG study: None Technical aspects: This EEG study was done with scalp electrodes positioned according to the 10-20 International system of electrode placement. Electrical activity was reviewed with band pass filter of 1-70Hz , sensitivity of 7 uV/mm, display speed of 54mm/sec with a 60Hz  notched filter applied as appropriate. EEG data were recorded continuously and digitally stored.  Video monitoring was available and reviewed as appropriate. Description: The posterior dominant rhythm consists of 9-10 Hz activity of moderate voltage (25-35 uV) seen predominantly in posterior head regions, symmetric and reactive to eye opening and eye closing. Sleep was characterized by vertex waves, sleep spindles (12 to 14 Hz), maximal frontocentral region. Physiologic photic driving was seen during photic stimulation. Hyperventilation was not performed.   IMPRESSION: This study is within normal limits. No seizures or epileptiform discharges were seen throughout the recording.   ECHOCARDIOGRAM COMPLETE  Result Date: 07/24/2022    ECHOCARDIOGRAM REPORT   Patient Name:   Barbara Rice Date of Exam: 07/24/2022 Medical Rec #:  07/26/2022        Height:       66.0 in Accession #:    [id number]        Weight:       169.4 lb Date of Birth:  28-Sep-1996       BSA:          1.864 m Patient Age:    25 years         BP:           130/82 mmHg Patient Gender: F                HR:           91 bpm. Exam Location:  Inpatient Procedure: 2D Echo, Cardiac Doppler, Color Doppler and 3D Echo Indications:    R55 Syncope  History:        Patient has no prior history of Echocardiogram examinations.                 Signs/Symptoms:Syncope. No prior cardiac history.  Sonographer:    02-06-1989 RDCS Referring Phys: Sheralyn Boatman DAVID MANUEL ORTIZ  Sonographer Comments: Suboptimal subcostal window. IMPRESSIONS  1. Left ventricular ejection fraction, by estimation, is 60 to 65%. The left ventricle has normal function. The left ventricle has no regional wall motion abnormalities.  Left ventricular diastolic parameters were normal.  2. Right ventricular systolic function is normal. The right ventricular size is normal. There is normal pulmonary artery systolic pressure. The estimated right ventricular systolic pressure is 27.2 mmHg.  3. The mitral valve is normal in structure. No evidence of mitral valve regurgitation. No evidence of mitral stenosis.  4. The aortic valve is tricuspid. Aortic valve regurgitation is not visualized. No aortic stenosis is present.  5. The inferior vena cava is normal in size with greater than 50% respiratory variability, suggesting right atrial pressure of 3 mmHg. FINDINGS  Left Ventricle: Left ventricular ejection fraction, by estimation, is 60 to 65%. The left ventricle has normal function. The left ventricle has no regional wall motion abnormalities. The left ventricular internal cavity size was normal in size. There is  no left ventricular hypertrophy. Left ventricular diastolic parameters were normal. Right Ventricle: The right ventricular size is normal. No increase in right ventricular wall thickness. Right ventricular systolic function is normal. There is normal pulmonary artery systolic pressure. The  tricuspid regurgitant velocity is 2.46 m/s, and  with an assumed right atrial pressure of 3 mmHg, the estimated right ventricular systolic pressure is 27.2 mmHg. Left Atrium: Left atrial size was normal in size. Right Atrium: Right atrial size was normal in size. Pericardium: There is no evidence of pericardial effusion. Mitral Valve: The mitral valve is normal in structure. No evidence of mitral valve regurgitation. No evidence of mitral valve stenosis. Tricuspid Valve: The tricuspid valve is normal in structure. Tricuspid valve regurgitation is trivial. Aortic Valve: The aortic valve is tricuspid. Aortic valve regurgitation is not visualized. No aortic stenosis is present. Pulmonic Valve: The pulmonic valve was normal in structure. Pulmonic valve regurgitation is trivial. Aorta: The aortic root is normal in size and structure. Venous: The inferior vena cava is normal in size with greater than 50% respiratory variability, suggesting right atrial pressure of 3 mmHg. IAS/Shunts: No atrial level shunt detected by color flow Doppler.  LEFT VENTRICLE PLAX 2D LVIDd:         3.80 cm     Diastology LVIDs:         2.60 cm     LV e' medial:    9.03 cm/s LV PW:         1.25 cm     LV E/e' medial:  8.2 LV IVS:        1.00 cm     LV e' lateral:   13.40 cm/s LVOT diam:     1.90 cm     LV E/e' lateral: 5.6 LV SV:         69 LV SV Index:   37 LVOT Area:     2.84 cm                             3D Volume EF: LV Volumes (MOD)           3D EF:        61 % LV vol d, MOD A2C: 65.8 ml LV EDV:       90 ml LV vol d, MOD A4C: 67.8 ml LV ESV:       35 ml LV vol s, MOD A2C: 23.9 ml LV SV:        55 ml LV vol s, MOD A4C: 25.6 ml LV SV MOD A2C:     41.9 ml LV SV MOD A4C:  67.8 ml LV SV MOD BP:      43.0 ml RIGHT VENTRICLE             IVC RV S prime:     13.40 cm/s  IVC diam: 1.30 cm TAPSE (M-mode): 2.5 cm LEFT ATRIUM           Index        RIGHT ATRIUM           Index LA diam:      2.40 cm 1.29 cm/m   RA Area:     12.00 cm LA Vol (A2C):  17.4 ml 9.34 ml/m   RA Volume:   24.70 ml  13.25 ml/m LA Vol (A4C): 25.9 ml 13.90 ml/m  AORTIC VALVE             PULMONIC VALVE LVOT Vmax:   133.00 cm/s PR End Diast Vel: 1.19 msec LVOT Vmean:  88.700 cm/s LVOT VTI:    0.243 m  AORTA Ao Root diam: 2.80 cm Ao Asc diam:  2.70 cm MITRAL VALVE               TRICUSPID VALVE MV Area (PHT): 4.21 cm    TR Peak grad:   24.2 mmHg MV Decel Time: 180 msec    TR Vmax:        246.00 cm/s MV E velocity: 74.40 cm/s MV A velocity: 68.60 cm/s  SHUNTS MV E/A ratio:  1.08        Systemic VTI:  0.24 m MV A Prime:    9.1 cm/s    Systemic Diam: 1.90 cm Dalton McleanMD Electronically signed by Wilfred Lacy Signature Date/Time: 07/24/2022/5:24:43 PM    Final    CT Angio Chest PE W and/or Wo Contrast  Result Date: 07/24/2022 CLINICAL DATA:  Pulmonary embolism suspected. Positive D-dimer. Patient loss consciousness. EXAM: CT ANGIOGRAPHY CHEST WITH CONTRAST TECHNIQUE: Multidetector CT imaging of the chest was performed using the standard protocol during bolus administration of intravenous contrast. Multiplanar CT image reconstructions and MIPs were obtained to evaluate the vascular anatomy. RADIATION DOSE REDUCTION: This exam was performed according to the departmental dose-optimization program which includes automated exposure control, adjustment of the mA and/or kV according to patient size and/or use of iterative reconstruction technique. CONTRAST:  75mL OMNIPAQUE IOHEXOL 350 MG/ML SOLN COMPARISON:  Chest radiography same day FINDINGS: Cardiovascular: Heart size is normal. No pericardial fluid. No current air artery calcification or aortic atherosclerotic calcification. Pulmonary arterial opacification is good. There are no pulmonary emboli. Mediastinum/Nodes: No mass or adenopathy. Lungs/Pleura: The lungs are clear.  No pleural effusion. Upper Abdomen: No upper abdominal abnormality is seen. Musculoskeletal: Mild spinal curvature.  No acute finding. Review of the MIP images  confirms the above findings. IMPRESSION: No pulmonary emboli or other acute chest pathology. Electronically Signed   By: Paulina Fusi M.D.   On: 07/24/2022 11:57   CT Head Wo Contrast  Result Date: 07/24/2022 CLINICAL DATA:  Mental status change, unknown cause EXAM: CT HEAD WITHOUT CONTRAST TECHNIQUE: Contiguous axial images were obtained from the base of the skull through the vertex without intravenous contrast. RADIATION DOSE REDUCTION: This exam was performed according to the departmental dose-optimization program which includes automated exposure control, adjustment of the mA and/or kV according to patient size and/or use of iterative reconstruction technique. COMPARISON:  None Available. FINDINGS: Brain: No evidence of acute infarction, hemorrhage, hydrocephalus, extra-axial collection or mass lesion/mass effect. Vascular: No hyperdense vessel. Skull: No acute fracture. Sinuses/Orbits: Clear sinuses.  No acute orbital findings. Other: No mastoid effusions. IMPRESSION: No evidence of acute intracranial abnormality. Electronically Signed   By: Feliberto Harts M.D.   On: 07/24/2022 10:37   DG Chest Portable 1 View  Result Date: 07/24/2022 CLINICAL DATA:  Altered mental status EXAM: PORTABLE CHEST 1 VIEW COMPARISON:  Chest x-ray dated April 07, 2017 FINDINGS: The heart size and mediastinal contours are within normal limits. Both lungs are clear. The visualized skeletal structures are unremarkable. IMPRESSION: No active disease. Electronically Signed   By: Allegra Lai M.D.   On: 07/24/2022 10:28   MR ANKLE LEFT WO CONTRAST  Result Date: 07/23/2022 CLINICAL DATA:  Bilateral ankle pain for 7 months. EXAM: MRI OF THE LEFT ANKLE WITHOUT CONTRAST TECHNIQUE: Multiplanar, multisequence MR imaging of the ankle was performed. No intravenous contrast was administered. COMPARISON:  None Available. FINDINGS: TENDONS Peroneal: Peroneal longus tendon intact. Peroneal brevis intact. Posteromedial: Posterior  tibial tendon intact. Flexor hallucis longus tendon intact. Flexor digitorum longus tendon intact. Anterior: Tibialis anterior tendon intact. Extensor hallucis longus tendon intact Extensor digitorum longus tendon intact. Achilles:  Intact. Plantar Fascia: Intact. LIGAMENTS Lateral: Anterior talofibular ligament intact. Calcaneofibular ligament intact. Posterior talofibular ligament intact. Anterior and posterior tibiofibular ligaments intact. Medial: Deltoid ligament intact. Spring ligament intact. CARTILAGE Ankle Joint: No joint effusion. Normal ankle mortise. No chondral defect. Subtalar Joints/Sinus Tarsi: Normal subtalar joints. Small posterior subtalar joint effusion. Normal sinus tarsi. Bones: No aggressive osseous lesion. No fracture or dislocation. Relative pes planus. Small talonavicular joint effusion. Soft Tissue: No fluid collection or hematoma. Muscles are normal without edema or atrophy. Tarsal tunnel is normal. IMPRESSION: 1. No acute injury of the left ankle. 2. Small posterior subtalar and talonavicular joint effusion. 3. Relative pes planus. Electronically Signed   By: Elige Ko M.D.   On: 07/23/2022 07:26   MR ANKLE RIGHT WO CONTRAST  Result Date: 07/23/2022 CLINICAL DATA:  Bilateral ankle pain and weakness for 7 months EXAM: MRI OF THE RIGHT ANKLE WITHOUT CONTRAST TECHNIQUE: Multiplanar, multisequence MR imaging of the ankle was performed. No intravenous contrast was administered. COMPARISON:  None Available. FINDINGS: TENDONS Peroneal: Peroneal longus tendon intact. Peroneal brevis intact. Posteromedial: Posterior tibial tendon intact. Flexor hallucis longus tendon intact. Flexor digitorum longus tendon intact. Small amount of fluid in the flexor hallucis longus tendon sheath. Anterior: Tibialis anterior tendon intact. Extensor hallucis longus tendon intact Extensor digitorum longus tendon intact. Achilles:  Intact. Plantar Fascia: Intact. LIGAMENTS Lateral: Anterior talofibular ligament  intact. Calcaneofibular ligament intact. Posterior talofibular ligament intact. Anterior and posterior tibiofibular ligaments intact. Medial: Deltoid ligament intact. Spring ligament intact. CARTILAGE Ankle Joint: No joint effusion. Normal ankle mortise. No chondral defect. Subtalar Joints/Sinus Tarsi: Normal subtalar joints. Small posterior subtalar joint effusion. Normal sinus tarsi. Bones: No aggressive osseous lesion. No fracture or dislocation. Small talonavicular joint effusion. Relative pes planus. Soft Tissue: No fluid collection or hematoma. Muscles are normal without edema or atrophy. Tarsal tunnel is normal. IMPRESSION: 1. No acute injury of the right ankle. 2. Relative pes planus. 3. Small talonavicular joint and posterior subtalar joint effusions. 4. Mild flexor hallucis longus tenosynovitis. Electronically Signed   By: Elige Ko M.D.   On: 07/23/2022 06:52    Microbiology: Results for orders placed or performed during the hospital encounter of 07/24/22  SARS Coronavirus 2 by RT PCR (hospital order, performed in [facility] hospital lab) *cepheid single result test* Anterior Nasal Swab     Status: None   Collection Time: 07/24/22 10:04 AM  Specimen: Anterior Nasal Swab  Result Value Ref Range Status   SARS Coronavirus 2 by RT PCR NEGATIVE NEGATIVE Final    Comment: (NOTE) SARS-CoV-2 target nucleic acids are NOT DETECTED.  The SARS-CoV-2 RNA is generally detectable in upper and lower respiratory specimens during the acute phase of infection. The lowest concentration of SARS-CoV-2 viral copies this assay can detect is 250 copies / mL. A negative result does not preclude SARS-CoV-2 infection and should not be used as the sole basis for treatment or other patient management decisions.  A negative result may occur with improper specimen collection / handling, submission of specimen other than nasopharyngeal swab, presence of viral mutation(s) within the areas targeted by this assay,  and inadequate number of viral copies (<250 copies / mL). A negative result must be combined with clinical observations, patient history, and epidemiological information.  Fact Sheet for Patients:   RoadLapTop.co.zahttps://www.fda.gov/media/158405/download  Fact Sheet for Healthcare Providers: http://kim-miller.com/https://www.fda.gov/media/158404/download  This test is not yet approved or  cleared by the [place] FDA and has been authorized for detection and/or diagnosis of SARS-CoV-2 by FDA under an Emergency Use Authorization (EUA).  This EUA will remain in effect (meaning this test can be used) for the duration of the COVID-19 declaration under Section 564(b)(1) of the Act, 21 U.S.C. section 360bbb-3(b)(1), unless the authorization is terminated or revoked sooner.  Performed at [facility], [address] [place], [place] [address]   MRSA Next Gen by PCR, Nasal     Status: None   Collection Time: 07/25/22  2:18 AM   Specimen: Nasal Mucosa; Nasal Swab  Result Value Ref Range Status   MRSA by PCR Next Gen NOT DETECTED NOT DETECTED Final    Comment: (NOTE) The GeneXpert MRSA Assay (FDA approved for NASAL specimens only), is one component of a comprehensive MRSA colonization surveillance program. It is not intended to diagnose MRSA infection nor to guide or monitor treatment for MRSA infections. Test performance is not FDA approved in patients less than 28 years old. Performed at [facility], [address] [place], [place] [address]     Labs: CBC: Recent Labs  Lab 07/24/22 1002 07/25/22 0250 07/26/22 0306 07/27/22 0654 07/28/22 0324  WBC 4.6 3.6* 4.9 3.6* 3.8*  NEUTROABS 1.5*  --   --   --   --   HGB 12.0 11.3* 11.8* 11.4* 11.3*  HCT 37.2 35.4* 38.7 34.3* 35.4*  MCV 87.7 88.9 94.6 86.8 88.9  PLT 211 197 215 121* 239   Basic Metabolic Panel: Recent Labs  Lab 07/24/22 1002 07/25/22 0250 07/27/22 0654 07/28/22 0324  NA 139 142 137 138  K 3.9 3.7 4.0  3.7  CL 108 109 106 105  CO2 24 23 24 26   GLUCOSE 112* 103* 98 93  BUN 10 11 9 9   CREATININE 0.70 0.68 0.73 0.80  CALCIUM 10.0 9.6 9.7 9.5  MG  --  1.9 1.9 1.9   Liver Function Tests: Recent Labs  Lab 07/24/22 1002 07/25/22 0250  AST 24 21  ALT 15 14  ALKPHOS 42 41  BILITOT 0.5 0.6  PROT 7.8 7.3  ALBUMIN 4.4 4.2   CBG: Recent Labs  Lab 07/25/22 0223 07/25/22 0450 07/26/22 0645 07/27/22 0623 07/29/22 1221  GLUCAP 82 93 85 106* 89    Discharge time spent: greater than 30 minutes.  Signed: Tyrone Nineyan B Charmine Bockrath, MD [facility] 07/29/2022

## 2022-07-29 NOTE — Progress Notes (Signed)
Neurology Progress Note  Subjective: No reported events, EEG imaging negative Patient reports getting up to the restroom this morning and states that she felt "woozy in her eyes" and "internal vibration of her body down her legs" without visible shaking/jerking.   Exam: Vitals:   07/29/22 0415 07/29/22 0818  BP: 118/70 116/81  Pulse: 71 70  Resp: 18 18  Temp: 97.9 F (36.6 C) 97.7 F (36.5 C)  SpO2: 100% 100%   Gen: Sitting up in bed, family at bedside, in no acute distress Resp: non-labored breathing, no acute distress on room air  Neuro: MS: Awake, alert, interactive and appropriate, oriented throughout, follows commands. Speech intact without dysarthria or aphasia. Communicates appropriately.  CN: EOMI, face symmetric, head is midline, tongue protrudes midline, visual fields are full.  Motor: Moves all extremities well without weakness or asymmetry.  Sensory: Intact and symmetric to light touch throughout  EEG overnight 7/30 - 7/31: "This study is within normal limits. No seizures or epileptiform discharges were seen throughout the recording."  EEG overnight 7/31 - 8/1: "This study is within normal limits. 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  The episode that she had 7/30 was not completely characteristic, but was clearly nonepileptic.  No further episodes were captured while patient was on EEG during her current admission. 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  I would not start antiepileptic therapy. Seizure precautions were provided to patient.   Recommendations: 1) Discontinue LTM monitoring 2) No antiepileptic therapy indicated at this time 3) No further inpatient neurology recommendations at this time 4) Follow up with outpatient neurology in 6-8 weeks  5) Discussed with attending provider and she is in  agreement    Discussed [facility] statutes, patients with seizures are not allowed to drive until they have been spell/seizure-free for six months. Use caution when using heavy equipment or power tools. Avoid working on ladders or at heights. Take showers instead of baths. Ensure the water temperature is not too high on the home water heater. Do not go swimming alone. Do not lock yourself in a room alone (i.e. bathroom). When caring for infants or small children, sit down when holding, feeding, or changing them to minimize risk of injury to the child in the event you have a seizure. Maintain good sleep hygiene. Avoid alcohol.  -- Lanae Boast, AGACNP-BC [facility] [phone]

## 2022-07-29 NOTE — Progress Notes (Signed)
LTM EEG discontinued - no skin breakdown at unhook.   

## 2022-07-29 NOTE — Plan of Care (Signed)

## 2022-07-29 NOTE — Telephone Encounter (Signed)
Pt had an appointment for today however, its been cancelled. I called Pt and she informed me that she's currently in the hospital and will call to reschedule when she's feeling better.

## 2022-07-29 NOTE — Progress Notes (Signed)
PT Cancellation Note  Patient Details Name: Danijah Noh MRN: [id number] DOB: 11-14-1996   Cancelled Treatment:    Reason Eval/Treat Not Completed: Physician in room speaking with patient. Will attempt back later today as time allows.    Berton Mount 07/29/2022, 11:14 AM

## 2022-07-29 NOTE — Procedures (Addendum)
Patient Name: Ellise Kovack  MRN: [id number]  Epilepsy Attending: Charlsie Quest  Referring Physician/Provider: Dimple Nanas, MD  Duration: 07/28/2022 1636 to 07/29/2022 1158   Patient history: 26 year old female with seizure like episodes  EEG Drolet for seizure.   Level of alertness: Awake, asleep   AEDs during EEG study: None   Technical aspects: This EEG study was done with scalp electrodes positioned according to the 10-20 International system of electrode placement. Electrical activity was reviewed with band pass filter of 1-70Hz , sensitivity of 7 uV/mm, display speed of 14mm/sec with a 60Hz  notched filter applied as appropriate. EEG data were recorded continuously and digitally stored.  Video monitoring was available and reviewed as appropriate.   Description: The posterior dominant rhythm consists of 9-10 Hz activity of moderate voltage (25-35 uV) seen predominantly in posterior head regions, symmetric and reactive to eye opening and eye closing. Sleep was characterized by vertex waves, sleep spindles (12 to 14 Hz), maximal frontocentral region.    IMPRESSION: This study is within normal limits. No seizures or epileptiform discharges were seen throughout the recording.   Beck Cofer 

## 2022-07-30 ENCOUNTER — Ambulatory Visit: Payer: 59 | Admitting: Obstetrics and Gynecology

## 2022-08-08 ENCOUNTER — Ambulatory Visit (INDEPENDENT_AMBULATORY_CARE_PROVIDER_SITE_OTHER): Payer: 59 | Admitting: Nurse Practitioner

## 2022-08-08 ENCOUNTER — Encounter: Payer: Self-pay | Admitting: Nurse Practitioner

## 2022-08-08 VITALS — BP 137/94 | HR 108 | Temp 98.2°F | Ht 66.0 in | Wt 170.5 lb

## 2022-08-08 DIAGNOSIS — R42 Dizziness and giddiness: Secondary | ICD-10-CM

## 2022-08-08 DIAGNOSIS — R569 Unspecified convulsions: Secondary | ICD-10-CM

## 2022-08-08 DIAGNOSIS — F419 Anxiety disorder, unspecified: Secondary | ICD-10-CM | POA: Diagnosis not present

## 2022-08-08 DIAGNOSIS — R110 Nausea: Secondary | ICD-10-CM

## 2022-08-08 DIAGNOSIS — I10 Essential (primary) hypertension: Secondary | ICD-10-CM | POA: Diagnosis not present

## 2022-08-08 MED ORDER — ONDANSETRON 4 MG PO TBDP: 4.0000 mg | ORAL_TABLET | Freq: Three times a day (TID) | ORAL | 0 refills | Status: DC | PRN

## 2022-08-08 MED ORDER — HYDROCHLOROTHIAZIDE 12.5 MG PO CAPS: 12.5000 mg | ORAL_CAPSULE | Freq: Every day | ORAL | 0 refills | Status: DC

## 2022-08-08 MED ORDER — HYDROXYZINE HCL 10 MG PO TABS: 10.0000 mg | ORAL_TABLET | Freq: Three times a day (TID) | ORAL | 0 refills | Status: DC | PRN

## 2022-08-08 NOTE — Patient Instructions (Addendum)
1. Primary hypertension  - CBC - Comprehensive metabolic panel - hydrochlorothiazide (MICROZIDE) 12.5 MG capsule; Take 1 capsule (12.5 mg total) by mouth daily.  Dispense: 30 capsule; Refill: 0  2. Anxiety  - hydrOXYzine (ATARAX) 10 MG tablet; Take 1 tablet (10 mg total) by mouth 3 (three) times daily as needed.  Dispense: 30 tablet; Refill: 0   Follow up:  Follow up in 3 months or sooner if needed

## 2022-08-08 NOTE — Assessment & Plan Note (Signed)
-   CBC - Comprehensive metabolic panel - hydrochlorothiazide (MICROZIDE) 12.5 MG capsule; Take 1 capsule (12.5 mg total) by mouth daily.  Dispense: 30 capsule; Refill: 0  2. Anxiety  - hydrOXYzine (ATARAX) 10 MG tablet; Take 1 tablet (10 mg total) by mouth 3 (three) times daily as needed.  Dispense: 30 tablet; Refill: 0   Follow up:  Follow up in 3 months or sooner if needed

## 2022-08-08 NOTE — Progress Notes (Signed)
@Patient  ID: , female    DOB: 04-Jul-1996, 26 y.o.   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 Thus far work-up including continuous EEG, MRI brain has been negative.  Does have some stressors at home.      Mildly elevated D-dimer No evidence of PE or DVT noted on lower extremity Dopplers and CTA.   Iron deficiency anemia IV iron ordered.  - PCP follow up recommended   Essential hypertension Currently not on any medication. Stable.   HPI  Patient presents today for hospital follow-up.  Please see notes above.  Patient reports that she does not take her blood pressure medication.  She is not following a low-salt diet.  Blood pressure was slightly elevated in office today.  Patient states that she has been having increased anxiety recently.  We will refer her to counseling here in the office.  We will recheck labs today.  Patient may return to work on 08/25/2022. Denies f/c/s, n/v/d, hemoptysis, PND, leg swelling Denies chest pain or edema     Allergies  Allergen Reactions   Pork-Derived Products Other (See Comments)    Religion-based; does not consume pork-products    Immunization History  Administered Date(s) Administered   Influenza-Unspecified 10/09/2021    Past Medical History:  Diagnosis Date   Sciatic nerve pain     Tobacco History: Social History   Tobacco Use  Smoking Status Never  Smokeless Tobacco Never   Counseling given: Not Answered   Outpatient Encounter Medications as of 08/08/2022  Medication Sig   acetaminophen-codeine  (TYLENOL #3) 300-30 MG tablet Take 1 tablet by mouth every 6 (six) hours as needed for moderate pain.   cyclobenzaprine (FLEXERIL) 5 MG tablet Take 1 tablet (5 mg total) by mouth 3 (three) times daily as needed for muscle spasms.   hydrochlorothiazide (MICROZIDE) 12.5 MG capsule Take 1 capsule (12.5 mg total) by mouth daily.   hydrOXYzine (ATARAX) 10 MG tablet Take 1 tablet (10 mg total) by mouth 3 (three) times daily as needed.   ibuprofen (ADVIL) 200 MG tablet Take 400 mg by mouth every 6 (six) hours as needed for mild pain.   meloxicam (MOBIC) 15 MG tablet Take 1 tablet (15 mg total) by mouth daily.   ondansetron (ZOFRAN-ODT) 4 MG disintegrating tablet Take 1 tablet (4 mg total) by mouth every 8 (eight) hours as needed for nausea or vomiting.   Sodium Fluoride 0.55 (0.25 F) MG/DROP SOLN 5 mLs by Mouth Rinse route at bedtime.   [DISCONTINUED] amLODipine (NORVASC) 10 MG tablet Take 1 tablet (10 mg total) by mouth daily. (Patient not taking: Reported on 08/08/2022)   No facility-administered encounter medications on file as of 08/08/2022.     Review of Systems  Review of Systems  Constitutional: Negative.   HENT: Negative.    Cardiovascular: Negative.   Gastrointestinal: Negative.   Allergic/Immunologic: Negative.   Neurological: Negative.   Psychiatric/Behavioral:  The patient is nervous/anxious.        Physical Exam  BP (!) 137/94 (BP Location: Left Arm, Patient Position: Sitting, Cuff Size: Normal)   Pulse (!) 108  Temp 98.2 F (36.8 C)   Ht 5\' 6"  (1.676 m)   Wt 170 lb 8 oz (77.3 kg)   LMP 07/23/2022   SpO2 100%   BMI 27.52 kg/m   Wt Readings from Last 5 Encounters:  08/08/22 170 lb 8 oz (77.3 kg)  07/29/22 180 lb 1.9 oz (81.7 kg)  06/24/22 169 lb 6.4 oz (76.8 kg)  06/18/22 169 lb (76.7 kg)  06/04/22 172 lb 6.4 oz (78.2 kg)     Physical Exam Vitals and nursing note reviewed.  Constitutional:      General: She is not in acute distress.    Appearance: She is  well-developed.  Cardiovascular:     Rate and Rhythm: Normal rate and regular rhythm.  Pulmonary:     Effort: Pulmonary effort is normal.     Breath sounds: Normal breath sounds.  Neurological:     Mental Status: She is alert and oriented to person, place, and time.      Lab Results:  CBC    Component Value Date/Time   WBC 3.8 (L) 07/28/2022 0324   RBC 3.98 07/28/2022 0324   HGB 11.3 (L) 07/28/2022 0324   HGB 11.7 06/04/2022 1121   HCT 35.4 (L) 07/28/2022 0324   HCT 37.1 06/04/2022 1121   PLT 239 07/28/2022 0324   PLT CANCELED 06/04/2022 1121   MCV 88.9 07/28/2022 0324   MCV 88 06/04/2022 1121   MCH 28.4 07/28/2022 0324   MCHC 31.9 07/28/2022 0324   RDW 12.5 07/28/2022 0324   RDW 12.5 06/04/2022 1121   LYMPHSABS 2.6 07/24/2022 1002   LYMPHSABS 1.9 06/04/2022 1121   MONOABS 0.4 07/24/2022 1002   EOSABS 0.1 07/24/2022 1002   EOSABS 0.1 06/04/2022 1121   BASOSABS 0.0 07/24/2022 1002   BASOSABS 0.0 06/04/2022 1121    BMET    Component Value Date/Time   NA 138 07/28/2022 0324   NA 138 06/04/2022 1121   K 3.7 07/28/2022 0324   CL 105 07/28/2022 0324   CO2 26 07/28/2022 0324   GLUCOSE 93 07/28/2022 0324   BUN 9 07/28/2022 0324   BUN 8 06/04/2022 1121   CREATININE 0.80 07/28/2022 0324   CREATININE 0.80 01/22/2022 1450   CALCIUM 9.5 07/28/2022 0324   GFRNONAA >60 07/28/2022 0324   GFRNONAA >60 01/22/2022 1450    BNP No results found for: "BNP"  ProBNP    Component Value Date/Time   PROBNP 5 11/29/2021 1610    Imaging: Overnight EEG with video  Result Date: 07/27/2022 07/29/2022, MD     07/29/2022 10:25 AM /Patient Name: Barbara Rice MRN: Barbara Rice Epilepsy Attending: [id number] Referring Physician/Provider: Charlsie Quest, MD Duration: 07/26/2022 1636 to 07/27/2022 1636  Patient history: 26 year old female with seizure like episodes  EEG Drolet for seizure.  Level of alertness: Awake, asleep  AEDs during EEG study: None  Technical aspects:  This EEG study was done with scalp electrodes positioned according to the 10-20 International system of electrode placement. Electrical activity was reviewed with band pass filter of 1-70Hz , sensitivity of 7 uV/mm, display speed of 52mm/sec with a 60Hz  notched filter applied as appropriate. EEG data were recorded continuously and digitally stored.  Video monitoring was available and reviewed as appropriate.  Description: The posterior dominant rhythm consists of 9-10 Hz activity of moderate voltage (25-35 uV) seen predominantly in posterior head regions, symmetric and reactive to eye opening and eye closing. Sleep was characterized by vertex waves, sleep spindles (12 to 14 Hz),  maximal frontocentral region. One event was recorded on 07/27/2022 at 0822. Patient was sitting on bedside commode with nursing tech standing next to her. She leaned to left side, eyes closed, not responding. Concomitant eeg showed normal posterior dominant rhythm.  IMPRESSION: This study is within normal limits. No seizures or epileptiform discharges were seen throughout the recording. One event was recorded as described above without concomitant eeg change. This was NON-epileptic event.  Charlsie Quest   MR BRAIN W WO CONTRAST  Result Date: 07/25/2022 CLINICAL DATA:  Seizure-like activity. EXAM: MRI HEAD WITHOUT AND WITH CONTRAST TECHNIQUE: Multiplanar, multiecho pulse sequences of the brain and surrounding structures were obtained without and with intravenous contrast. CONTRAST:  8mL GADAVIST GADOBUTROL 1 MMOL/ML IV SOLN COMPARISON:  CT head 1 day prior FINDINGS: Brain: There is no acute intracranial hemorrhage, extra-axial fluid collection, or acute infarct. Parenchymal volume is normal. The ventricles are normal in size. Gray-white differentiation is preserved. There is no structural or migration abnormality. The pattern of myelination is normal. The corpus callosum is normally formed. The pituitary and suprasellar region are  normal. The hippocampi are normal in signal and architecture. There is no abnormal enhancement. There is no mass effect. There is no mass lesion. There is no midline shift. Vascular: Normal flow voids. Skull and upper cervical spine: Normal marrow signal. Sinuses/Orbits: There is mild mucosal thickening in the paranasal sinuses. The globes and orbits are unremarkable. Other: None. IMPRESSION: Normal brain MRI with no acute intracranial pathology or epileptogenic focus identified. Electronically Signed   By: Lesia Hausen M.D.   On: 07/25/2022 15:11   VAS [place] LOWER EXTREMITY VENOUS (DVT)  Result Date: 07/25/2022  Lower Venous DVT Study Patient Name:  TASCHA CASARES  Date of Exam:   07/25/2022 Medical Rec #: [id number]         Accession #:    [id number] Date of Birth: 06-13-1996        Patient Gender: F Patient Age:   36 years Exam Location:  [facility] Procedure:      VAS [place] LOWER EXTREMITY VENOUS (DVT) Referring Phys: Alwyn Ren GONFA --------------------------------------------------------------------------------  Indications: Elevated Ddimer.  Risk Factors: None identified. Comparison Study: No prior studies. Performing Technologist: Chanda Busing RVT  Examination Guidelines: A complete evaluation includes B-mode imaging, spectral Doppler, color Doppler, and power Doppler as needed of all accessible portions of each vessel. Bilateral testing is considered an integral part of a complete examination. Limited examinations for reoccurring indications may be performed as noted. The reflux portion of the exam is performed with the patient in reverse Trendelenburg.  +---------+---------------+---------+-----------+----------+--------------+ RIGHT    CompressibilityPhasicitySpontaneityPropertiesThrombus Aging +---------+---------------+---------+-----------+----------+--------------+ CFV      Full           Yes      Yes                                  +---------+---------------+---------+-----------+----------+--------------+ SFJ      Full                                                        +---------+---------------+---------+-----------+----------+--------------+ FV Prox  Full                                                        +---------+---------------+---------+-----------+----------+--------------+  FV Mid   Full                                                        +---------+---------------+---------+-----------+----------+--------------+ FV DistalFull                                                        +---------+---------------+---------+-----------+----------+--------------+ PFV      Full                                                        +---------+---------------+---------+-----------+----------+--------------+ POP      Full           Yes      Yes                                 +---------+---------------+---------+-----------+----------+--------------+ PTV      Full                                                        +---------+---------------+---------+-----------+----------+--------------+ PERO     Full                                                        +---------+---------------+---------+-----------+----------+--------------+   +---------+---------------+---------+-----------+----------+--------------+ LEFT     CompressibilityPhasicitySpontaneityPropertiesThrombus Aging +---------+---------------+---------+-----------+----------+--------------+ CFV      Full           Yes      Yes                                 +---------+---------------+---------+-----------+----------+--------------+ SFJ      Full                                                        +---------+---------------+---------+-----------+----------+--------------+ FV Prox  Full                                                         +---------+---------------+---------+-----------+----------+--------------+ FV Mid   Full                                                        +---------+---------------+---------+-----------+----------+--------------+  FV DistalFull                                                        +---------+---------------+---------+-----------+----------+--------------+ PFV      Full                                                        +---------+---------------+---------+-----------+----------+--------------+ POP      Full           Yes      Yes                                 +---------+---------------+---------+-----------+----------+--------------+ PTV      Full                                                        +---------+---------------+---------+-----------+----------+--------------+ PERO     Full                                                        +---------+---------------+---------+-----------+----------+--------------+     Summary: RIGHT: - There is no evidence of deep vein thrombosis in the lower extremity.  - No cystic structure found in the popliteal fossa.  LEFT: - There is no evidence of deep vein thrombosis in the lower extremity.  - No cystic structure found in the popliteal fossa.  *See table(s) above for measurements and observations. Electronically signed by Heath Larkhomas Hawken on 07/25/2022 at 2:15:33 PM.    Final    EEG adult  Result Date: 07/25/2022 Charlsie QuestYadav, Priyanka O, MD     07/25/2022  8:43 AM Patient Name: Barbara RileOlasunbo Aiken MRN: [id number] Epilepsy Attending: Charlsie QuestPriyanka O Yadav Referring Physician/Provider: Virgina Norfolkuratolo, Adam, DO Date: 07/24/2022 Duration: 27.18 mins Patient history: 26 year old female with syncope.  EEG Drolet for seizure. Level of alertness: Awake, asleep AEDs during EEG study: None Technical aspects: This EEG study was done with scalp electrodes positioned according to the 10-20 International system of electrode placement. Electrical  activity was reviewed with band pass filter of 1-70Hz , sensitivity of 7 uV/mm, display speed of 4430mm/sec with a 60Hz  notched filter applied as appropriate. EEG data were recorded continuously and digitally stored.  Video monitoring was available and reviewed as appropriate. Description: The posterior dominant rhythm consists of 9-10 Hz activity of moderate voltage (25-35 uV) seen predominantly in posterior head regions, symmetric and reactive to eye opening and eye closing. Sleep was characterized by vertex waves, sleep spindles (12 to 14 Hz), maximal frontocentral region. Physiologic photic driving was seen during photic stimulation. Hyperventilation was not performed.   IMPRESSION: This study is within normal limits. No seizures or epileptiform discharges were seen throughout the recording. Charlsie Questriyanka O Yadav   ECHOCARDIOGRAM COMPLETE  Result Date: 07/24/2022    ECHOCARDIOGRAM REPORT  Patient Name:   Barbara Rice Date of Exam: 07/24/2022 Medical Rec #:  [id number]        Height:       66.0 in Accession #:    [id number]       Weight:       169.4 lb Date of Birth:  [birth year]       BSA:          1.864 m Patient Age:    25 years         BP:           130/82 mmHg Patient Gender: F                HR:           91 bpm. Exam Location:  Inpatient Procedure: 2D Echo, Cardiac Doppler, Color Doppler and 3D Echo Indications:    R55 Syncope  History:        Patient has no prior history of Echocardiogram examinations.                 Signs/Symptoms:Syncope. No prior cardiac history.  Sonographer:    Sheralyn Boatman RDCS Referring Phys: [id number] DAVID MANUEL ORTIZ  Sonographer Comments: Suboptimal subcostal window. IMPRESSIONS  1. Left ventricular ejection fraction, by estimation, is 60 to 65%. The left ventricle has normal function. The left ventricle has no regional wall motion abnormalities. Left ventricular diastolic parameters were normal.  2. Right ventricular systolic function is normal. The right ventricular size is  normal. There is normal pulmonary artery systolic pressure. The estimated right ventricular systolic pressure is 27.2 mmHg.  3. The mitral valve is normal in structure. No evidence of mitral valve regurgitation. No evidence of mitral stenosis.  4. The aortic valve is tricuspid. Aortic valve regurgitation is not visualized. No aortic stenosis is present.  5. The inferior vena cava is normal in size with greater than 50% respiratory variability, suggesting right atrial pressure of 3 mmHg. FINDINGS  Left Ventricle: Left ventricular ejection fraction, by estimation, is 60 to 65%. The left ventricle has normal function. The left ventricle has no regional wall motion abnormalities. The left ventricular internal cavity size was normal in size. There is  no left ventricular hypertrophy. Left ventricular diastolic parameters were normal. Right Ventricle: The right ventricular size is normal. No increase in right ventricular wall thickness. Right ventricular systolic function is normal. There is normal pulmonary artery systolic pressure. The tricuspid regurgitant velocity is 2.46 m/s, and  with an assumed right atrial pressure of 3 mmHg, the estimated right ventricular systolic pressure is 27.2 mmHg. Left Atrium: Left atrial size was normal in size. Right Atrium: Right atrial size was normal in size. Pericardium: There is no evidence of pericardial effusion. Mitral Valve: The mitral valve is normal in structure. No evidence of mitral valve regurgitation. No evidence of mitral valve stenosis. Tricuspid Valve: The tricuspid valve is normal in structure. Tricuspid valve regurgitation is trivial. Aortic Valve: The aortic valve is tricuspid. Aortic valve regurgitation is not visualized. No aortic stenosis is present. Pulmonic Valve: The pulmonic valve was normal in structure. Pulmonic valve regurgitation is trivial. Aorta: The aortic root is normal in size and structure. Venous: The inferior vena cava is normal in size with  greater than 50% respiratory variability, suggesting right atrial pressure of 3 mmHg. IAS/Shunts: No atrial level shunt detected by color flow Doppler.  LEFT VENTRICLE PLAX 2D LVIDd:         3.80 cm  Diastology LVIDs:         2.60 cm     LV e' medial:    9.03 cm/s LV PW:         1.25 cm     LV E/e' medial:  8.2 LV IVS:        1.00 cm     LV e' lateral:   13.40 cm/s LVOT diam:     1.90 cm     LV E/e' lateral: 5.6 LV SV:         69 LV SV Index:   37 LVOT Area:     2.84 cm                             3D Volume EF: LV Volumes (MOD)           3D EF:        61 % LV vol d, MOD A2C: 65.8 ml LV EDV:       90 ml LV vol d, MOD A4C: 67.8 ml LV ESV:       35 ml LV vol s, MOD A2C: 23.9 ml LV SV:        55 ml LV vol s, MOD A4C: 25.6 ml LV SV MOD A2C:     41.9 ml LV SV MOD A4C:     67.8 ml LV SV MOD BP:      43.0 ml RIGHT VENTRICLE             IVC RV S prime:     13.40 cm/s  IVC diam: 1.30 cm TAPSE (M-mode): 2.5 cm LEFT ATRIUM           Index        RIGHT ATRIUM           Index LA diam:      2.40 cm 1.29 cm/m   RA Area:     12.00 cm LA Vol (A2C): 17.4 ml 9.34 ml/m   RA Volume:   24.70 ml  13.25 ml/m LA Vol (A4C): 25.9 ml 13.90 ml/m  AORTIC VALVE             PULMONIC VALVE LVOT Vmax:   133.00 cm/s PR End Diast Vel: 1.19 msec LVOT Vmean:  88.700 cm/s LVOT VTI:    0.243 m  AORTA Ao Root diam: 2.80 cm Ao Asc diam:  2.70 cm MITRAL VALVE               TRICUSPID VALVE MV Area (PHT): 4.21 cm    TR Peak grad:   24.2 mmHg MV Decel Time: 180 msec    TR Vmax:        246.00 cm/s MV E velocity: 74.40 cm/s MV A velocity: 68.60 cm/s  SHUNTS MV E/A ratio:  1.08        Systemic VTI:  0.24 m MV A Prime:    9.1 cm/s    Systemic Diam: 1.90 cm Dalton McleanMD Electronically signed by Wilfred Lacy Signature Date/Time: 07/24/2022/5:24:43 PM    Final    CT Angio Chest PE W and/or Wo Contrast  Result Date: 07/24/2022 CLINICAL DATA:  Pulmonary embolism suspected. Positive D-dimer. Patient loss consciousness. EXAM: CT ANGIOGRAPHY CHEST WITH  CONTRAST TECHNIQUE: Multidetector CT imaging of the chest was performed using the standard protocol during bolus administration of intravenous contrast. Multiplanar CT image reconstructions and MIPs were obtained to evaluate the vascular anatomy. RADIATION DOSE REDUCTION: This exam  was performed according to the departmental dose-optimization program which includes automated exposure control, adjustment of the mA and/or kV according to patient size and/or use of iterative reconstruction technique. CONTRAST:  75mL OMNIPAQUE IOHEXOL 350 MG/ML SOLN COMPARISON:  Chest radiography same day FINDINGS: Cardiovascular: Heart size is normal. No pericardial fluid. No current air artery calcification or aortic atherosclerotic calcification. Pulmonary arterial opacification is good. There are no pulmonary emboli. Mediastinum/Nodes: No mass or adenopathy. Lungs/Pleura: The lungs are clear.  No pleural effusion. Upper Abdomen: No upper abdominal abnormality is seen. Musculoskeletal: Mild spinal curvature.  No acute finding. Review of the MIP images confirms the above findings. IMPRESSION: No pulmonary emboli or other acute chest pathology. Electronically Signed   By: Paulina Fusi M.D.   On: 07/24/2022 11:57   CT Head Wo Contrast  Result Date: 07/24/2022 CLINICAL DATA:  Mental status change, unknown cause EXAM: CT HEAD WITHOUT CONTRAST TECHNIQUE: Contiguous axial images were obtained from the base of the skull through the vertex without intravenous contrast. RADIATION DOSE REDUCTION: This exam was performed according to the departmental dose-optimization program which includes automated exposure control, adjustment of the mA and/or kV according to patient size and/or use of iterative reconstruction technique. COMPARISON:  None Available. FINDINGS: Brain: No evidence of acute infarction, hemorrhage, hydrocephalus, extra-axial collection or mass lesion/mass effect. Vascular: No hyperdense vessel. Skull: No acute fracture.  Sinuses/Orbits: Clear sinuses.  No acute orbital findings. Other: No mastoid effusions. IMPRESSION: No evidence of acute intracranial abnormality. Electronically Signed   By: Feliberto Harts M.D.   On: 07/24/2022 10:37   DG Chest Portable 1 View  Result Date: 07/24/2022 CLINICAL DATA:  Altered mental status EXAM: PORTABLE CHEST 1 VIEW COMPARISON:  Chest x-ray dated April 07, 2017 FINDINGS: The heart size and mediastinal contours are within normal limits. Both lungs are clear. The visualized skeletal structures are unremarkable. IMPRESSION: No active disease. Electronically Signed   By: Allegra Lai M.D.   On: 07/24/2022 10:28   MR ANKLE LEFT WO CONTRAST  Result Date: 07/23/2022 CLINICAL DATA:  Bilateral ankle pain for 7 months. EXAM: MRI OF THE LEFT ANKLE WITHOUT CONTRAST TECHNIQUE: Multiplanar, multisequence MR imaging of the ankle was performed. No intravenous contrast was administered. COMPARISON:  None Available. FINDINGS: TENDONS Peroneal: Peroneal longus tendon intact. Peroneal brevis intact. Posteromedial: Posterior tibial tendon intact. Flexor hallucis longus tendon intact. Flexor digitorum longus tendon intact. Anterior: Tibialis anterior tendon intact. Extensor hallucis longus tendon intact Extensor digitorum longus tendon intact. Achilles:  Intact. Plantar Fascia: Intact. LIGAMENTS Lateral: Anterior talofibular ligament intact. Calcaneofibular ligament intact. Posterior talofibular ligament intact. Anterior and posterior tibiofibular ligaments intact. Medial: Deltoid ligament intact. Spring ligament intact. CARTILAGE Ankle Joint: No joint effusion. Normal ankle mortise. No chondral defect. Subtalar Joints/Sinus Tarsi: Normal subtalar joints. Small posterior subtalar joint effusion. Normal sinus tarsi. Bones: No aggressive osseous lesion. No fracture or dislocation. Relative pes planus. Small talonavicular joint effusion. Soft Tissue: No fluid collection or hematoma. Muscles are normal  without edema or atrophy. Tarsal tunnel is normal. IMPRESSION: 1. No acute injury of the left ankle. 2. Small posterior subtalar and talonavicular joint effusion. 3. Relative pes planus. Electronically Signed   By: Elige Ko M.D.   On: 07/23/2022 07:26   MR ANKLE RIGHT WO CONTRAST  Result Date: 07/23/2022 CLINICAL DATA:  Bilateral ankle pain and weakness for 7 months EXAM: MRI OF THE RIGHT ANKLE WITHOUT CONTRAST TECHNIQUE: Multiplanar, multisequence MR imaging of the ankle was performed. No intravenous contrast was administered. COMPARISON:  None Available. FINDINGS: TENDONS Peroneal: Peroneal longus tendon intact. Peroneal brevis intact. Posteromedial: Posterior tibial tendon intact. Flexor hallucis longus tendon intact. Flexor digitorum longus tendon intact. Small amount of fluid in the flexor hallucis longus tendon sheath. Anterior: Tibialis anterior tendon intact. Extensor hallucis longus tendon intact Extensor digitorum longus tendon intact. Achilles:  Intact. Plantar Fascia: Intact. LIGAMENTS Lateral: Anterior talofibular ligament intact. Calcaneofibular ligament intact. Posterior talofibular ligament intact. Anterior and posterior tibiofibular ligaments intact. Medial: Deltoid ligament intact. Spring ligament intact. CARTILAGE Ankle Joint: No joint effusion. Normal ankle mortise. No chondral defect. Subtalar Joints/Sinus Tarsi: Normal subtalar joints. Small posterior subtalar joint effusion. Normal sinus tarsi. Bones: No aggressive osseous lesion. No fracture or dislocation. Small talonavicular joint effusion. Relative pes planus. Soft Tissue: No fluid collection or hematoma. Muscles are normal without edema or atrophy. Tarsal tunnel is normal. IMPRESSION: 1. No acute injury of the right ankle. 2. Relative pes planus. 3. Small talonavicular joint and posterior subtalar joint effusions. 4. Mild flexor hallucis longus tenosynovitis. Electronically Signed   By: Elige Ko M.D.   On: 07/23/2022 06:52      Assessment & Plan:   Seizure-like activity (HCC) - CBC - Comprehensive metabolic panel - hydrochlorothiazide (MICROZIDE) 12.5 MG capsule; Take 1 capsule (12.5 mg total) by mouth daily.  Dispense: 30 capsule; Refill: 0  2. Anxiety  - hydrOXYzine (ATARAX) 10 MG tablet; Take 1 tablet (10 mg total) by mouth 3 (three) times daily as needed.  Dispense: 30 tablet; Refill: 0   Follow up:  Follow up in 3 months or sooner if needed     Ivonne Andrew, NP 08/08/2022

## 2022-08-09 LAB — COMPREHENSIVE METABOLIC PANEL
ALT: 22 IU/L (ref 0–32)
AST: 24 IU/L (ref 0–40)
Albumin/Globulin Ratio: 1.7 (ref 1.2–2.2)
Albumin: 5.1 g/dL — ABNORMAL HIGH (ref 4.0–5.0)
Alkaline Phosphatase: 48 IU/L (ref 44–121)
BUN/Creatinine Ratio: 10 (ref 9–23)
BUN: 8 mg/dL (ref 6–20)
Bilirubin Total: 0.3 mg/dL (ref 0.0–1.2)
CO2: 21 mmol/L (ref 20–29)
Calcium: 10.4 mg/dL — ABNORMAL HIGH (ref 8.7–10.2)
Chloride: 103 mmol/L (ref 96–106)
Creatinine, Ser: 0.8 mg/dL (ref 0.57–1.00)
Globulin, Total: 3 g/dL (ref 1.5–4.5)
Glucose: 96 mg/dL (ref 70–99)
Potassium: 4.3 mmol/L (ref 3.5–5.2)
Sodium: 140 mmol/L (ref 134–144)
Total Protein: 8.1 g/dL (ref 6.0–8.5)
eGFR: 103 mL/min/{1.73_m2} (ref >59)

## 2022-08-09 LAB — CBC
Hematocrit: 38.7 % (ref 34.0–46.6)
Hemoglobin: 12.7 g/dL (ref 11.1–15.9)
MCH: 28.7 pg (ref 26.6–33.0)
MCHC: 32.8 g/dL (ref 31.5–35.7)
MCV: 87 fL (ref 79–97)
RBC: 4.43 x10E6/uL (ref 3.77–5.28)
RDW: 13.1 % (ref 11.7–15.4)
WBC: 3.4 10*3/uL (ref 3.4–10.8)

## 2022-08-12 ENCOUNTER — Ambulatory Visit: Payer: 59 | Admitting: Obstetrics and Gynecology

## 2022-08-12 ENCOUNTER — Encounter: Payer: Self-pay | Admitting: Obstetrics and Gynecology

## 2022-08-12 DIAGNOSIS — Z9889 Other specified postprocedural states: Secondary | ICD-10-CM | POA: Insufficient documentation

## 2022-08-12 NOTE — Patient Instructions (Signed)

## 2022-08-12 NOTE — Progress Notes (Signed)
Ms Petras presents for f/u from LEEP Pathology reviewed with pt. Margins clear. Pt without concerns today Denies any bowel or bladder dysfunction Has had a cycle since procedure  PE AF VSS Chaperone present Lungs clear Heart RRR Abd soft + BS GU NL EGBUS, cervix well healed  A/P S/P LEEP  Return to ADL's as tolerates Repeat pap in 1 yr

## 2022-08-12 NOTE — Progress Notes (Signed)
Pt presents for follow up after LEEP 06/18/22. She reports no concerns today.

## 2022-08-15 ENCOUNTER — Ambulatory Visit (INDEPENDENT_AMBULATORY_CARE_PROVIDER_SITE_OTHER): Payer: 59 | Admitting: Clinical

## 2022-08-15 DIAGNOSIS — F419 Anxiety disorder, unspecified: Secondary | ICD-10-CM | POA: Diagnosis not present

## 2022-08-15 NOTE — BH Specialist Note (Addendum)
Integrated Behavioral Health Initial In-Person Visit  MRN: [id number] Name: Barbara Rice  Number of Integrated Behavioral Health Clinician visits: 1- Initial Visit  Session Start time: 1100    Session End time: 1200  Total time in minutes: 60   Types of Service: Individual psychotherapy  Interpretor:No. Interpretor Name and Language: none  Subjective: Barbara Rice is a 26 y.o. female accompanied by  self. Patient was referred by Angus Seller, NP for anxiety. Patient reports the following symptoms/concerns: anxiety related to health conditions Duration of problem: several months; Severity of problem: moderate  Objective: Mood: Euthymic and Affect: Appropriate Risk of harm to self or others: No plan to harm self or others  Life Context: Family and Social: lives with dad, several siblings live in the area School/Work: currently on leave from work due to medical condition Self-Care:  Life Changes: hospitalized for seizure-like activity a few weeks ago  Patient and/or Family's Strengths/Protective Factors: Social connections, Social and Patent attorney, Concrete supports in place (healthy food, safe environments, etc.), and Sense of purpose  Goals Addressed: Patient will: Reduce symptoms of: anxiety Increase knowledge and/or ability of: coping skills and self-management skills  Demonstrate ability to: Increase healthy adjustment to current life circumstances  Progress towards Goals: Ongoing  Interventions: Interventions utilized: Supportive Counseling  Standardized Assessments completed: GAD-7 and PHQ 9    08/15/2022   11:23 AM  GAD 7 : Generalized Anxiety Score  Nervous, Anxious, on Edge 1  Control/stop worrying 0  Worry too much - different things 1  Trouble relaxing 0  Restless 1  Easily annoyed or irritable 0  Afraid - awful might happen 0  Total GAD 7 Score 3  Anxiety Difficulty Somewhat difficult        08/15/2022   11:27 AM 08/08/2022     9:54 AM 06/04/2022   11:00 AM 04/21/2022    3:27 PM 03/25/2022   10:41 AM  Depression screen PHQ 2/9  Decreased Interest 1 0 0 0 0  Down, Depressed, Hopeless 1 0 0 0 0  PHQ - 2 Score 2 0 0 0 0  Altered sleeping 0 2     Tired, decreased energy 1 3     Change in appetite 3 0     Feeling bad or failure about yourself  0      Trouble concentrating 3 0     Moving slowly or fidgety/restless 0 0     Suicidal thoughts 0 0     PHQ-9 Score 9 5     Difficult doing work/chores Somewhat difficult        Assessment and supportive counseling today around patient's anxiety related to health conditions. Patient's scores on PHQ and GAD are relatively low, but patient identifies as experiencing anxiety that leads to uncomfortable physiological symptoms. Patient also has follow up with neurology follow up in early September.   Patient and/or Family Response: Patient engaged in session.   Patient Centered Plan: Patient is on the following Treatment Plan(s):  CBT and mindfulness for anxiety  Assessment: Patient currently experiencing anxiety related to her health conditions.    Patient may benefit from CBT to explore thoughts that exacerbate anxious feelings. She may also benefit from mindfulness including breath work to reduce physiological symptoms associated with anxiety.   Plan: Follow up with behavioral health clinician on: 09/04/22 Referral(s): Integrated [employer] (In Clinic)  Abigail Butts, LCSW [facility] [phone]

## 2022-08-20 ENCOUNTER — Telehealth: Payer: Self-pay

## 2022-08-20 NOTE — Telephone Encounter (Signed)
Pt called and asked if we FLMA form was faxed?

## 2022-08-25 ENCOUNTER — Ambulatory Visit (INDEPENDENT_AMBULATORY_CARE_PROVIDER_SITE_OTHER): Payer: Managed Care, Other (non HMO) | Admitting: Podiatry

## 2022-08-25 DIAGNOSIS — M76822 Posterior tibial tendinitis, left leg: Secondary | ICD-10-CM

## 2022-08-25 DIAGNOSIS — M2142 Flat foot [pes planus] (acquired), left foot: Secondary | ICD-10-CM

## 2022-08-25 DIAGNOSIS — M76821 Posterior tibial tendinitis, right leg: Secondary | ICD-10-CM | POA: Diagnosis not present

## 2022-08-25 DIAGNOSIS — M2141 Flat foot [pes planus] (acquired), right foot: Secondary | ICD-10-CM

## 2022-08-25 MED ORDER — METHYLPREDNISOLONE 4 MG PO TBPK: ORAL_TABLET | ORAL | 0 refills | Status: DC

## 2022-08-25 MED ORDER — DICLOFENAC SODIUM 75 MG PO TBEC: 75.0000 mg | DELAYED_RELEASE_TABLET | Freq: Two times a day (BID) | ORAL | 2 refills | Status: DC

## 2022-08-25 NOTE — Progress Notes (Signed)
Subjective:  Patient ID: Barbara Rice, female    DOB: 12/28/1996,  MRN: [id number]  Chief Complaint  Patient presents with   Follow-up    Patient is here following up on bilateral heel pain. Over patient feels good. No concerns.     26 y.o. female returns for follow-up with the above complaint. History confirmed with patient.  Still having pain by the end of the day.  Still bothersome  Objective:  Physical Exam: warm, good capillary refill, no trophic changes or ulcerative lesions, normal DP and PT pulses, normal sensory exam, and she has right worse than left pes planus deformity with collapse of the medial arch, mild tenderness to palpation of the PT tendon at the insertion of the navicular, right worse than left  Radiographs: Previous radiographs taken November 2022 show significant pes planus deformity with collapse of the medial longitudinal arch and abduction of the forefoot  Study Result  Narrative & Impression  CLINICAL DATA:  Bilateral ankle pain for 7 months.   EXAM: MRI OF THE LEFT ANKLE WITHOUT CONTRAST   TECHNIQUE: Multiplanar, multisequence MR imaging of the ankle was performed. No intravenous contrast was administered.   COMPARISON:  None Available.   FINDINGS: TENDONS   Peroneal: Peroneal longus tendon intact. Peroneal brevis intact.   Posteromedial: Posterior tibial tendon intact. Flexor hallucis longus tendon intact. Flexor digitorum longus tendon intact.   Anterior: Tibialis anterior tendon intact. Extensor hallucis longus tendon intact Extensor digitorum longus tendon intact.   Achilles:  Intact.   Plantar Fascia: Intact.   LIGAMENTS   Lateral: Anterior talofibular ligament intact. Calcaneofibular ligament intact. Posterior talofibular ligament intact. Anterior and posterior tibiofibular ligaments intact.   Medial: Deltoid ligament intact. Spring ligament intact.   CARTILAGE   Ankle Joint: No joint effusion. Normal ankle mortise. No  chondral defect.   Subtalar Joints/Sinus Tarsi: Normal subtalar joints. Small posterior subtalar joint effusion. Normal sinus tarsi.   Bones: No aggressive osseous lesion. No fracture or dislocation. Relative pes planus. Small talonavicular joint effusion.   Soft Tissue: No fluid collection or hematoma. Muscles are normal without edema or atrophy. Tarsal tunnel is normal.   IMPRESSION: 1. No acute injury of the left ankle. 2. Small posterior subtalar and talonavicular joint effusion. 3. Relative pes planus.     Electronically Signed   By: Elige Ko M.D.   On: 07/23/2022 07:26    Study Result  Narrative & Impression  CLINICAL DATA:  Bilateral ankle pain and weakness for 7 months   EXAM: MRI OF THE RIGHT ANKLE WITHOUT CONTRAST   TECHNIQUE: Multiplanar, multisequence MR imaging of the ankle was performed. No intravenous contrast was administered.   COMPARISON:  None Available.   FINDINGS: TENDONS   Peroneal: Peroneal longus tendon intact. Peroneal brevis intact.   Posteromedial: Posterior tibial tendon intact. Flexor hallucis longus tendon intact. Flexor digitorum longus tendon intact. Small amount of fluid in the flexor hallucis longus tendon sheath.   Anterior: Tibialis anterior tendon intact. Extensor hallucis longus tendon intact Extensor digitorum longus tendon intact.   Achilles:  Intact.   Plantar Fascia: Intact.   LIGAMENTS   Lateral: Anterior talofibular ligament intact. Calcaneofibular ligament intact. Posterior talofibular ligament intact. Anterior and posterior tibiofibular ligaments intact.   Medial: Deltoid ligament intact. Spring ligament intact.   CARTILAGE   Ankle Joint: No joint effusion. Normal ankle mortise. No chondral defect.   Subtalar Joints/Sinus Tarsi: Normal subtalar joints. Small posterior subtalar joint effusion. Normal sinus tarsi.   Bones: No aggressive osseous  lesion. No fracture or dislocation. Small talonavicular  joint effusion. Relative pes planus.   Soft Tissue: No fluid collection or hematoma. Muscles are normal without edema or atrophy. Tarsal tunnel is normal.   IMPRESSION: 1. No acute injury of the right ankle. 2. Relative pes planus. 3. Small talonavicular joint and posterior subtalar joint effusions. 4. Mild flexor hallucis longus tenosynovitis.     Electronically Signed   By: Elige Ko M.D.   On: 07/23/2022 06:52       Assessment:   1. Posterior tibial tendon dysfunction (PTTD) of both lower extremities   2. Pes planus of both feet      Plan:  Patient was evaluated and treated and all questions answered.  We reviewed the results of her MRIs.  Overall very little inflammation present.  Still has not completely improved.  She did not start physical therapy I reordered this for her and gave her the number for scheduling.  Methylprednisolone taper and Voltaren p.o. Rx sent to pharmacy recommended immobilization in a cam walker boot which was dispensed today.  She will wear this on the right side.  I will see her back in 6 weeks for follow-up   Return in about 6 weeks (around 10/06/2022) for follow up PT tendonitis .

## 2022-08-25 NOTE — Patient Instructions (Signed)
Call to schedule physical therapy: [facility] [address]  [phone]  

## 2022-09-02 ENCOUNTER — Encounter: Payer: Self-pay | Admitting: Neurology

## 2022-09-02 ENCOUNTER — Ambulatory Visit: Payer: 59 | Admitting: Neurology

## 2022-09-02 VITALS — BP 128/81 | HR 91 | Ht 66.0 in | Wt 173.0 lb

## 2022-09-02 DIAGNOSIS — F419 Anxiety disorder, unspecified: Secondary | ICD-10-CM

## 2022-09-02 DIAGNOSIS — R69 Illness, unspecified: Secondary | ICD-10-CM | POA: Diagnosis not present

## 2022-09-02 DIAGNOSIS — R569 Unspecified convulsions: Secondary | ICD-10-CM

## 2022-09-02 NOTE — Progress Notes (Signed)
[facility]  PATIENT: Barbara Rice DOB: Apr 01, 1996  REQUESTING CLINICIAN: Fenton Foy, NP HISTORY FROM: Patient  REASON FOR VISIT: Seizure like activity    HISTORICAL  CHIEF COMPLAINT:  Chief Complaint  Patient presents with   New Patient (Initial Visit)    Rm 12, alone Pt reports episode with arm jerking that lasted around 10 mins, 3 days ago    HISTORY OF PRESENT ILLNESS:  This is a 26 year old woman with no reported past medical history who is presenting for seizure like activity.  Patient reported the first episode started on July 27, she was at work and had a sudden feeling of sensation crawling up her legs, then she froze while walking and collapsed to the ground.  She was taken to the ED work-up negative.  She was admitted for further work-up.  She did have an MRI brain which was negative for any acute intracranial abnormality and a long-term EEG which was also negative.  During the long-term monitoring, patient did have an event which did not have any EEG correlate.  Since then she reported increased stress, heart palpitation, states that overall she is not stressed out but every time she thinks about going back to work going or going to the hospital she will have her heart pounding.  She has been off work since the 27 of July. She was prescribed Atarax with minimal relief.  Patient stated she presented to her primary care doctor, was referred for therapy, she had her first session and felt like therapy was helpful.  She is not on SSRI.  Denies increased respiratory    Handedness: Right handed   Onset: July 27  Seizure Type: Felling of abnormal sensation starting from the legs and going up, dizziness, body jerking, nausea   Current frequency: Last episode 9/2  Any injuries from seizures: None   Seizure risk factors: None reported   Previous ASMs: None   Currenty ASMs: None   ASMs side effects: N/A   Brain Images: normal Brain MRI    Previous EEGs: Normal EEG    OTHER MEDICAL CONDITIONS: None   REVIEW OF SYSTEMS: Full 14 system review of systems performed and negative with exception of: As noted in the HPI   ALLERGIES: Allergies  Allergen Reactions   Pork-Derived Products Other (See Comments)    Religion-based; does not consume pork-products    HOME MEDICATIONS: Outpatient Medications Prior to Visit  Medication Sig Dispense Refill   acetaminophen-codeine (TYLENOL #3) 300-30 MG tablet Take 1 tablet by mouth every 6 (six) hours as needed for moderate pain.     cyclobenzaprine (FLEXERIL) 5 MG tablet Take 1 tablet (5 mg total) by mouth 3 (three) times daily as needed for muscle spasms. 20 tablet 0   diclofenac (VOLTAREN) 75 MG EC tablet Take 1 tablet (75 mg total) by mouth 2 (two) times daily. 30 tablet 2   hydrochlorothiazide (MICROZIDE) 12.5 MG capsule Take 1 capsule (12.5 mg total) by mouth daily. 30 capsule 0   hydrOXYzine (ATARAX) 10 MG tablet Take 1 tablet (10 mg total) by mouth 3 (three) times daily as needed. 30 tablet 0   ibuprofen (ADVIL) 200 MG tablet Take 400 mg by mouth every 6 (six) hours as needed for mild pain.     methylPREDNISolone (MEDROL DOSEPAK) 4 MG TBPK tablet 6 day dose pack - take as directed 21 tablet 0   ondansetron (ZOFRAN-ODT) 4 MG disintegrating tablet Take 1 tablet (4 mg total) by mouth every 8 (eight)  hours as needed for nausea or vomiting. 20 tablet 0   Sodium Fluoride 0.55 (0.25 F) MG/DROP SOLN 5 mLs by Mouth Rinse route at bedtime.     No facility-administered medications prior to visit.    PAST MEDICAL HISTORY: Past Medical History:  Diagnosis Date   Sciatic nerve pain     PAST SURGICAL HISTORY: History reviewed. No pertinent surgical history.  FAMILY HISTORY: Family History  Problem Relation Age of Onset   Prostatitis Father    Hypertension Father    Cervical cancer Paternal Aunt    Ovarian cancer Paternal Aunt    Cancer Cousin     SOCIAL HISTORY: Social  History   Socioeconomic History   Marital status: Married    Spouse name: Not on file   Number of children: Not on file   Years of education: Not on file   Highest education level: Not on file  Occupational History   Not on file  Tobacco Use   Smoking status: Never   Smokeless tobacco: Never  Vaping Use   Vaping Use: Never used  Substance and Sexual Activity   Alcohol use: Never   Drug use: Never   Sexual activity: Not Currently    Birth control/protection: None  Other Topics Concern   Not on file  Social History Narrative   Not on file   Social Determinants of Health   Financial Resource Strain: Not on file  Food Insecurity: Not on file  Transportation Needs: Not on file  Physical Activity: Not on file  Stress: Not on file  Social Connections: Not on file  Intimate Partner Violence: Not on file    PHYSICAL EXAM  GENERAL EXAM/CONSTITUTIONAL: Vitals:  Vitals:   09/02/22 1407  BP: 128/81  Pulse: 91  Weight: 173 lb (78.5 kg)  Height: 5\' 6"  (1.676 m)   Body mass index is 27.92 kg/m. Wt Readings from Last 3 Encounters:  09/02/22 173 lb (78.5 kg)  08/12/22 172 lb 12.8 oz (78.4 kg)  08/08/22 170 lb 8 oz (77.3 kg)   Patient is in no distress; well developed, nourished and groomed; neck is supple  EYES: Pupils round and reactive to light, Visual fields full to confrontation, Extraocular movements intacts,  No results found.  MUSCULOSKELETAL: Gait, strength, tone, movements noted in Neurologic exam below  NEUROLOGIC: MENTAL STATUS:      No data to display         awake, alert, oriented to person, place and time recent and remote memory intact normal attention and concentration language fluent, comprehension intact, naming intact fund of knowledge appropriate  CRANIAL NERVE:  2nd, 3rd, 4th, 6th - pupils equal and reactive to light, visual fields full to confrontation, extraocular muscles intact, no nystagmus 5th - facial sensation symmetric 7th -  facial strength symmetric 8th - hearing intact 9th - palate elevates symmetrically, uvula midline 11th - shoulder shrug symmetric 12th - tongue protrusion midline  MOTOR:  normal bulk and tone, full strength in the BUE, BLE  SENSORY:  normal and symmetric to light touch  COORDINATION:  finger-nose-finger, fine finger movements normal  REFLEXES:  deep tendon reflexes present and symmetric  GAIT/STATION:  normal   DIAGNOSTIC DATA (LABS, IMAGING, TESTING) - I reviewed patient records, labs, notes, testing and imaging myself where available.  Rice Results  Component Value Date   WBC 3.4 08/08/2022   HGB 12.7 08/08/2022   HCT 38.7 08/08/2022   MCV 87 08/08/2022   PLT Comment 08/08/2022  Component Value Date/Time   NA 140 08/08/2022 1040   K 4.3 08/08/2022 1040   CL 103 08/08/2022 1040   CO2 21 08/08/2022 1040   GLUCOSE 96 08/08/2022 1040   GLUCOSE 93 07/28/2022 0324   BUN 8 08/08/2022 1040   CREATININE 0.80 08/08/2022 1040   CREATININE 0.80 01/22/2022 1450   CALCIUM 10.4 (H) 08/08/2022 1040   PROT 8.1 08/08/2022 1040   ALBUMIN 5.1 (H) 08/08/2022 1040   AST 24 08/08/2022 1040   AST 21 01/22/2022 1450   ALT 22 08/08/2022 1040   ALT 12 01/22/2022 1450   ALKPHOS 48 08/08/2022 1040   BILITOT 0.3 08/08/2022 1040   BILITOT 0.7 01/22/2022 1450   GFRNONAA >60 07/28/2022 0324   GFRNONAA >60 01/22/2022 1450   Rice Results  Component Value Date   CHOL 227 (H) 11/29/2021   HDL 78 11/29/2021   LDLCALC 133 (H) 11/29/2021   TRIG 92 11/29/2021   Rice Results  Component Value Date   HGBA1C 5.6 11/29/2021   Rice Results  Component Value Date   VITAMINB12 722 07/25/2022   Rice Results  Component Value Date   TSH 2.346 07/25/2022    MRI Brain 07/25/22 Normal brain MRI with no acute intracranial pathology or epileptogenic focus identified.  LTM EEG 07/27/22 This study is within normal limits. No seizures or epileptiform discharges were seen throughout the  recording. One event was recorded as described above without concomitant eeg change. This was NON-epileptic event  I personally reviewed brain Images and previous EEG reports.   ASSESSMENT AND PLAN  26 y.o. year old female  with no reported medical history who is presenting with events concerning for seizure.  Events described as heart pounding, numbness, chest tightness, abnormal sensation in the legs.  Her long-term EEG was negative for any abnormal discharges and her brain MRI was normal.  During her monitoring she also had an event that did not have any EEG correlate.  Patient symptoms are likely related to generalized anxiety versus nonepileptic events.  I did advise her to continue following up with her primary care doctor, to continue with her therapy and if indicated, she may need additional medications.  Return as needed she is voices understanding.   1. Seizure-like activity (HCC)   2. Anxiety     Patient Instructions  Continue current medications  Continue with therapy sessions  Continue to follow up with PCP  Return as needed    Per [facility] statutes, patients with seizures are not allowed to drive until they have been seizure-free for six months.  Other recommendations include using caution when using heavy equipment or power tools. Avoid working on ladders or at heights. Take showers instead of baths.  Do not swim alone.  Ensure the water temperature is not too high on the home water heater. Do not go swimming alone. Do not lock yourself in a room alone (i.e. bathroom). When caring for infants or small children, sit down when holding, feeding, or changing them to minimize risk of injury to the child in the event you have a seizure. Maintain good sleep hygiene. Avoid alcohol.  Also recommend adequate sleep, hydration, good diet and minimize stress.   During the Seizure  - First, ensure adequate ventilation and place patients on the floor on their left side  Loosen  clothing around the neck and ensure the airway is patent. If the patient is clenching the teeth, do not force the mouth open with any object as this  can cause severe damage - Remove all items from the surrounding that can be hazardous. The patient may be oblivious to what's happening and may not even know what he or she is doing. If the patient is confused and wandering, either gently guide him/her away and block access to outside areas - Reassure the individual and be comforting - Call 911. In most cases, the seizure ends before EMS arrives. However, there are cases when seizures may last over 3 to 5 minutes. Or the individual may have developed breathing difficulties or severe injuries. If a pregnant patient or a person with diabetes develops a seizure, it is prudent to call an ambulance. - Finally, if the patient does not regain full consciousness, then call EMS. Most patients will remain confused for about 45 to 90 minutes after a seizure, so you must use judgment in calling for help. - Avoid restraints but make sure the patient is in a bed with padded side rails - Place the individual in a lateral position with the neck slightly flexed; this will help the saliva drain from the mouth and prevent the tongue from falling backward - Remove all nearby furniture and other hazards from the area - Provide verbal assurance as the individual is regaining consciousness - Provide the patient with privacy if possible - Call for help and start treatment as ordered by the caregiver   After the Seizure (Postictal Stage)  After a seizure, most patients experience confusion, fatigue, muscle pain and/or a headache. Thus, one should permit the individual to sleep. For the next few days, reassurance is essential. Being calm and helping reorient the person is also of importance.  Most seizures are painless and end spontaneously. Seizures are not harmful to others but can lead to complications such as stress on the  lungs, brain and the heart. Individuals with prior lung problems may develop labored breathing and respiratory distress.     No orders of the defined types were placed in this encounter.   No orders of the defined types were placed in this encounter.   Return if symptoms worsen or fail to improve.  I have spent a total of 50 minutes dedicated to this patient today, preparing to see patient, performing a medically appropriate examination and evaluation, ordering tests and/or medications and procedures, and counseling and educating the patient/family/caregiver; independently interpreting result and communicating results to the family/patient/caregiver; and documenting clinical information in the electronic medical record.   Alric Ran, MD 09/02/2022, 5:17 PM  [facility] [address], [place], [place] [address] [phone]

## 2022-09-02 NOTE — Patient Instructions (Signed)
Continue current medications  Continue with therapy sessions  Continue to follow up with PCP  Return as needed

## 2022-09-04 ENCOUNTER — Ambulatory Visit (INDEPENDENT_AMBULATORY_CARE_PROVIDER_SITE_OTHER): Payer: Self-pay | Admitting: Clinical

## 2022-09-04 DIAGNOSIS — F419 Anxiety disorder, unspecified: Secondary | ICD-10-CM

## 2022-09-04 NOTE — BH Specialist Note (Signed)
Integrated Behavioral Health Follow Up In-Person Visit  MRN: [id number] Name: Barbara Rice  Number of Integrated Behavioral Health Clinician visits: 2- Second Visit  Session Start time: 1305   Session End time: 1405  Total time in minutes: 60   Types of Service: Individual psychotherapy  Interpretor:No. Interpretor Name and Language: none  Subjective: Barbara Rice is a 26 y.o. female accompanied by  self. Patient was referred by Angus Seller, NP for anxiety. Patient reports the following symptoms/concerns: anxiety related to health conditions Duration of problem: several months; Severity of problem: severe  Objective: Mood: Euthymic and Affect: Appropriate Risk of harm to self or others: No plan to harm self or others  Patient and/or Family's Strengths/Protective Factors: Social connections, Social and Emotional competence, Concrete supports in place (healthy food, safe environments, etc.), and Sense of purpose  Goals Addressed: Patient will: Reduce symptoms of: anxiety Increase knowledge and/or ability of: coping skills and self-management skills  Demonstrate ability to: Increase healthy adjustment to current life circumstances  Progress towards Goals: Ongoing  Interventions: Interventions utilized:  Mindfulness or Management consultant and CBT Cognitive Behavioral Therapy Standardized Assessments completed: Not Needed  CBT and supportive counseling today to explore patient's thoughts and emotions about the seizure-like activity she has been experiencing. 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 She has frequent flashbacks of this incident with heightened fear. Processed some emotions related to her experiences. Patient is considering a continued rest period from work as opposed to pushing through and returning too quickly. Practiced neuro-respiratory integration exercise (mindful breath work) to calm  nervous system. Patient participated and reported benefit.  Patient and/or Family Response: Patient engaged in session.   Assessment: Patient currently experiencing anxiety related to her health conditions. She experiences seizure-like activity when anxiety is particularly heightened.   Patient may benefit from CBT to explore thoughts that exacerbate anxious feelings. She may also benefit from mindfulness including breath work to reduce physiological symptoms associated with anxiety.   Plan: Follow up with behavioral health clinician on: Patient will call to schedule follow up, as she will be traveling the next few weeks  Abigail Butts, LCSW [facility] [phone]

## 2022-09-29 ENCOUNTER — Telehealth: Payer: Self-pay | Admitting: Nurse Practitioner

## 2022-09-29 NOTE — Telephone Encounter (Signed)
Barbara Rice at [facility] requesting a call back at  [phone] regarding outstanding orders that need to be returned.

## 2022-10-01 ENCOUNTER — Telehealth: Payer: Self-pay

## 2022-10-01 NOTE — Telephone Encounter (Signed)
They need paperwork completed. They are going to fax that back over now.

## 2022-10-01 NOTE — Telephone Encounter (Signed)
[facility]) is call for outstanding Verbal orders   [phone]  She has been calling every week.

## 2022-10-06 ENCOUNTER — Ambulatory Visit: Payer: 59 | Admitting: Podiatry

## 2023-01-22 ENCOUNTER — Other Ambulatory Visit: Payer: Self-pay | Admitting: Internal Medicine

## 2023-01-22 ENCOUNTER — Ambulatory Visit
Admission: RE | Admit: 2023-01-22 | Discharge: 2023-01-22 | Disposition: A | Discharge status: Home / Self Care | Payer: 59 | Source: Ambulatory Visit | Attending: Internal Medicine | Admitting: Internal Medicine

## 2023-01-22 DIAGNOSIS — M549 Dorsalgia, unspecified: Secondary | ICD-10-CM

## 2023-01-22 DIAGNOSIS — Z0183 Encounter for blood typing: Secondary | ICD-10-CM | POA: Diagnosis not present

## 2023-01-22 DIAGNOSIS — R5383 Other fatigue: Secondary | ICD-10-CM | POA: Diagnosis not present

## 2023-01-22 DIAGNOSIS — E559 Vitamin D deficiency, unspecified: Secondary | ICD-10-CM | POA: Diagnosis not present

## 2023-02-09 DIAGNOSIS — D709 Neutropenia, unspecified: Secondary | ICD-10-CM | POA: Diagnosis not present

## 2023-03-24 DIAGNOSIS — G4719 Other hypersomnia: Secondary | ICD-10-CM | POA: Diagnosis not present

## 2023-03-24 DIAGNOSIS — F5104 Psychophysiologic insomnia: Secondary | ICD-10-CM | POA: Diagnosis not present

## 2023-04-23 DIAGNOSIS — M549 Dorsalgia, unspecified: Secondary | ICD-10-CM | POA: Diagnosis not present

## 2023-04-23 DIAGNOSIS — Z13220 Encounter for screening for lipoid disorders: Secondary | ICD-10-CM | POA: Diagnosis not present

## 2023-04-23 DIAGNOSIS — R002 Palpitations: Secondary | ICD-10-CM | POA: Diagnosis not present

## 2023-04-23 DIAGNOSIS — E559 Vitamin D deficiency, unspecified: Secondary | ICD-10-CM | POA: Diagnosis not present

## 2023-04-23 DIAGNOSIS — F413 Other mixed anxiety disorders: Secondary | ICD-10-CM | POA: Diagnosis not present

## 2023-04-23 DIAGNOSIS — D235 Other benign neoplasm of skin of trunk: Secondary | ICD-10-CM | POA: Diagnosis not present

## 2023-04-23 DIAGNOSIS — Z0000 Encounter for general adult medical examination without abnormal findings: Secondary | ICD-10-CM | POA: Diagnosis not present

## 2023-04-23 DIAGNOSIS — J3089 Other allergic rhinitis: Secondary | ICD-10-CM | POA: Diagnosis not present

## 2023-06-11 DIAGNOSIS — G4719 Other hypersomnia: Secondary | ICD-10-CM | POA: Diagnosis not present

## 2023-06-11 DIAGNOSIS — G4710 Hypersomnia, unspecified: Secondary | ICD-10-CM | POA: Diagnosis not present

## 2023-08-10 DIAGNOSIS — Z13220 Encounter for screening for lipoid disorders: Secondary | ICD-10-CM | POA: Diagnosis not present

## 2023-08-10 DIAGNOSIS — D709 Neutropenia, unspecified: Secondary | ICD-10-CM | POA: Diagnosis not present

## 2023-08-17 ENCOUNTER — Telehealth: Payer: Self-pay

## 2023-08-17 ENCOUNTER — Telehealth: Payer: Self-pay | Admitting: Physician Assistant

## 2023-08-17 NOTE — Telephone Encounter (Signed)
T/C from pt stating she was told by her PCP to call and make an appt with her hematologist for a low WBC.  Pt advised we saw her in the past for inguinal lymphadenopathy and that she will need to have her PCP fax records over with current lab results.    Referred to scheduling to call pt for an appt.

## 2023-08-31 ENCOUNTER — Other Ambulatory Visit: Payer: Self-pay | Admitting: Physician Assistant

## 2023-08-31 DIAGNOSIS — D72818 Other decreased white blood cell count: Secondary | ICD-10-CM

## 2023-09-01 ENCOUNTER — Inpatient Hospital Stay: Payer: 59

## 2023-09-01 ENCOUNTER — Ambulatory Visit (INDEPENDENT_AMBULATORY_CARE_PROVIDER_SITE_OTHER): Payer: 59 | Admitting: Obstetrics and Gynecology

## 2023-09-01 ENCOUNTER — Encounter: Payer: Self-pay | Admitting: Obstetrics and Gynecology

## 2023-09-01 ENCOUNTER — Inpatient Hospital Stay (HOSPITAL_BASED_OUTPATIENT_CLINIC_OR_DEPARTMENT_OTHER): Payer: 59 | Admitting: Physician Assistant

## 2023-09-01 ENCOUNTER — Other Ambulatory Visit: Payer: Self-pay

## 2023-09-01 ENCOUNTER — Other Ambulatory Visit (HOSPITAL_COMMUNITY)
Admission: RE | Admit: 2023-09-01 | Discharge: 2023-09-01 | Disposition: A | Discharge status: Home / Self Care | Payer: 59 | Source: Ambulatory Visit

## 2023-09-01 ENCOUNTER — Inpatient Hospital Stay: Payer: 59 | Attending: Physician Assistant

## 2023-09-01 VITALS — BP 115/79 | HR 89 | Ht 67.0 in | Wt 176.0 lb

## 2023-09-01 VITALS — BP 123/82 | HR 80 | Temp 98.7°F | Resp 16 | Ht 67.0 in | Wt 178.4 lb

## 2023-09-01 DIAGNOSIS — Z1339 Encounter for screening examination for other mental health and behavioral disorders: Secondary | ICD-10-CM

## 2023-09-01 DIAGNOSIS — D72818 Other decreased white blood cell count: Secondary | ICD-10-CM

## 2023-09-01 DIAGNOSIS — R87612 Low grade squamous intraepithelial lesion on cytologic smear of cervix (LGSIL): Secondary | ICD-10-CM | POA: Insufficient documentation

## 2023-09-01 DIAGNOSIS — D709 Neutropenia, unspecified: Secondary | ICD-10-CM | POA: Diagnosis present

## 2023-09-01 DIAGNOSIS — R590 Localized enlarged lymph nodes: Secondary | ICD-10-CM | POA: Insufficient documentation

## 2023-09-01 DIAGNOSIS — Z8041 Family history of malignant neoplasm of ovary: Secondary | ICD-10-CM | POA: Insufficient documentation

## 2023-09-01 DIAGNOSIS — Z8049 Family history of malignant neoplasm of other genital organs: Secondary | ICD-10-CM | POA: Insufficient documentation

## 2023-09-01 DIAGNOSIS — R2231 Localized swelling, mass and lump, right upper limb: Secondary | ICD-10-CM

## 2023-09-01 DIAGNOSIS — Z01419 Encounter for gynecological examination (general) (routine) without abnormal findings: Secondary | ICD-10-CM | POA: Diagnosis not present

## 2023-09-01 DIAGNOSIS — Z23 Encounter for immunization: Secondary | ICD-10-CM | POA: Diagnosis not present

## 2023-09-01 LAB — CBC WITH DIFFERENTIAL (CANCER CENTER ONLY)
Abs Immature Granulocytes: 0.01 10*3/uL (ref 0.00–0.07)
Basophils Absolute: 0 10*3/uL (ref 0.0–0.1)
Basophils Relative: 1 %
Eosinophils Absolute: 0 10*3/uL (ref 0.0–0.5)
Eosinophils Relative: 1 %
HCT: 34 % — ABNORMAL LOW (ref 36.0–46.0)
Hemoglobin: 11.6 g/dL — ABNORMAL LOW (ref 12.0–15.0)
Immature Granulocytes: 0 %
Lymphocytes Relative: 52 %
Lymphs Abs: 1.8 10*3/uL (ref 0.7–4.0)
MCH: 29.7 pg (ref 26.0–34.0)
MCHC: 34.1 g/dL (ref 30.0–36.0)
MCV: 87.2 fL (ref 80.0–100.0)
Monocytes Absolute: 0.2 10*3/uL (ref 0.1–1.0)
Monocytes Relative: 6 %
Neutro Abs: 1.4 10*3/uL — ABNORMAL LOW (ref 1.7–7.7)
Neutrophils Relative %: 40 %
Platelet Count: 179 10*3/uL (ref 150–400)
RBC: 3.9 MIL/uL (ref 3.87–5.11)
RDW: 12 % (ref 11.5–15.5)
WBC Count: 3.4 10*3/uL — ABNORMAL LOW (ref 4.0–10.5)
nRBC: 0 % (ref 0.0–0.2)

## 2023-09-01 LAB — CMP (CANCER CENTER ONLY)
ALT: 9 U/L (ref 0–44)
AST: 14 U/L — ABNORMAL LOW (ref 15–41)
Albumin: 4.4 g/dL (ref 3.5–5.0)
Alkaline Phosphatase: 42 U/L (ref 38–126)
Anion gap: 5 (ref 5–15)
BUN: 11 mg/dL (ref 6–20)
CO2: 27 mmol/L (ref 22–32)
Calcium: 9.5 mg/dL (ref 8.9–10.3)
Chloride: 106 mmol/L (ref 98–111)
Creatinine: 0.82 mg/dL (ref 0.44–1.00)
GFR, Estimated: >60 mL/min (ref >60)
Glucose, Bld: 92 mg/dL (ref 70–99)
Potassium: 3.8 mmol/L (ref 3.5–5.1)
Sodium: 138 mmol/L (ref 135–145)
Total Bilirubin: 0.5 mg/dL (ref 0.3–1.2)
Total Protein: 7.3 g/dL (ref 6.5–8.1)

## 2023-09-01 LAB — IRON AND IRON BINDING CAPACITY (CC-WL,HP ONLY)
Iron: 63 ug/dL (ref 28–170)
Saturation Ratios: 17 % (ref 10.4–31.8)
TIBC: 378 ug/dL (ref 250–450)
UIBC: 315 ug/dL (ref 148–442)

## 2023-09-01 LAB — C-REACTIVE PROTEIN: CRP: 0.6 mg/dL (ref <1.0)

## 2023-09-01 LAB — HEPATITIS B CORE ANTIBODY, TOTAL: Hep B Core Total Ab: REACTIVE — AB

## 2023-09-01 LAB — HEPATITIS B SURFACE ANTIGEN: Hepatitis B Surface Ag: NONREACTIVE

## 2023-09-01 LAB — HEPATITIS B SURFACE ANTIBODY,QUALITATIVE: Hep B S Ab: REACTIVE — AB

## 2023-09-01 LAB — SEDIMENTATION RATE: Sed Rate: 8 mm/h (ref 0–22)

## 2023-09-01 LAB — VITAMIN B12: Vitamin B-12: 458 pg/mL (ref 180–914)

## 2023-09-01 LAB — FOLATE: Folate: 17.3 ng/mL (ref >5.9)

## 2023-09-01 NOTE — Progress Notes (Signed)
[facility] [phone]   [phone]  PROGRESS NOTE:  Patient Care Team: Collene Mares, [place] as PCP - General (Internal Medicine)  CHIEF COMPLAINTS/PURPOSE OF CONSULTATION:  Leukopenia/neutropenia.  HISTORY OF PRESENTING ILLNESS:  Barbara Rice 27 y.o. female returns to the clinic for a follow up for evaluation of leukopenia. She is unaccompanied for this visit.   On exam today, Barbara Rice reports her energy levels are fairly stable. She is able to complete all her ADLs on her own. She denies nausea, vomiting or bowel habit changes. She denies any signs of bleeding except for her monthly menstrual cycles which are regular. She denies fevers, chills, sweats, shortness of breath, chest pain, cough, urinary symptoms or vaginal symptoms. She has no other complaints. Remaining 10 point ROS is below.   MEDICAL HISTORY:  Past Medical History:  Diagnosis Date   Sciatic nerve pain     SURGICAL HISTORY: No past surgical history on file.  SOCIAL HISTORY: Social History   Socioeconomic History   Marital status: Married    Spouse name: Not on file   Number of children: Not on file   Years of education: Not on file   Highest education level: Not on file  Occupational History   Not on file  Tobacco Use   Smoking status: Never   Smokeless tobacco: Never  Vaping Use   Vaping status: Never Used  Substance and Sexual Activity   Alcohol use: Never   Drug use: Never   Sexual activity: Not Currently    Birth control/protection: None  Other Topics Concern   Not on file  Social History Narrative   Not on file   Social Determinants of Health   Financial Resource Strain: Not on file  Food Insecurity: Not on file  Transportation Needs: Not on file  Physical Activity: Not on file  Stress: Not on file  Social Connections: Unknown (05/13/2022)   Received from [facility]   Social Network    Social Network: Not on file   Intimate Partner Violence: Unknown (04/04/2022)   [facility] from [facility]   HITS    Physically Hurt: Not on file    Insult or Talk Down To: Not on file    Threaten Physical Harm: Not on file    Scream or Curse: Not on file    FAMILY HISTORY: Family History  Problem Relation Age of Onset   Prostatitis Father    Hypertension Father    Cervical cancer Paternal Aunt    Ovarian cancer Paternal Aunt    Cancer Cousin     ALLERGIES:  is allergic to pork-derived products.  MEDICATIONS:  Current Outpatient Medications  Medication Sig Dispense Refill   acetaminophen-codeine (TYLENOL #3) 300-30 MG tablet Take 1 tablet by mouth every 6 (six) hours as needed for moderate pain.     cyclobenzaprine (FLEXERIL) 5 MG tablet Take 1 tablet (5 mg total) by mouth 3 (three) times daily as needed for muscle spasms. 20 tablet 0   diclofenac (VOLTAREN) 75 MG EC tablet Take 1 tablet (75 mg total) by mouth 2 (two) times daily. 30 tablet 2   hydrOXYzine (ATARAX) 10 MG tablet Take 1 tablet (10 mg total) by mouth 3 (three) times daily as needed. 30 tablet 0   ibuprofen (ADVIL) 200 MG tablet Take 400 mg by mouth every 6 (six) hours as needed for mild pain.     methylPREDNISolone (MEDROL DOSEPAK) 4 MG TBPK tablet 6 day  dose pack - take as directed 21 tablet 0   ondansetron (ZOFRAN-ODT) 4 MG disintegrating tablet Take 1 tablet (4 mg total) by mouth every 8 (eight) hours as needed for nausea or vomiting. 20 tablet 0   Sodium Fluoride 0.55 (0.25 F) MG/DROP SOLN 5 mLs by Mouth Rinse route at bedtime.     hydrochlorothiazide (MICROZIDE) 12.5 MG capsule Take 1 capsule (12.5 mg total) by mouth daily. 30 capsule 0   No current facility-administered medications for this visit.    REVIEW OF SYSTEMS:   Constitutional: ( - ) fevers, ( - )  chills , ( - ) night sweats Eyes: ( - ) blurriness of vision, ( - ) double vision, ( - ) watery eyes Ears, nose, mouth, throat, and face: ( - ) mucositis, ( - ) sore  throat Respiratory: ( - ) cough, ( - ) dyspnea, ( - ) wheezes Cardiovascular: ( - ) palpitation, ( - ) chest discomfort, ( - ) lower extremity swelling Gastrointestinal:  ( - ) nausea, ( - ) heartburn, ( - ) change in bowel habits Skin: ( - ) abnormal skin rashes Lymphatics: ( - ) new lymphadenopathy, ( - ) easy bruising Neurological: ( - ) numbness, ( - ) tingling, ( - ) new weaknesses Behavioral/Psych: ( - ) mood change, ( - ) new changes  All other systems were reviewed with the patient and are negative.  PHYSICAL EXAMINATION: ECOG PERFORMANCE STATUS: 1 - Symptomatic but completely ambulatory  Vitals:   09/01/23 1338  BP: 123/82  Pulse: 80  Resp: 16  Temp: 98.7 F (37.1 C)  SpO2: 100%   Filed Weights   09/01/23 1338  Weight: 178 lb 7 oz (80.9 kg)    GENERAL: well appearing female in NAD  SKIN: skin color, texture, turgor are normal, no rashes or significant lesions EYES: conjunctiva are pink and non-injected, sclera clear NECK: supple, non-tender LYMPH:  no palpable lymphadenopathy in the cervical or supraclavicular lymph nodes.  LUNGS: clear to auscultation and percussion with normal breathing effort HEART: regular rate & rhythm and no murmurs. No lower extremity edema.  Musculoskeletal: no cyanosis of digits and no clubbing  PSYCH: alert & oriented x 3, fluent speech NEURO: no focal motor/sensory deficits  LABORATORY DATA:  I have reviewed the data as listed    Latest Ref Rng & Units 09/01/2023    1:29 PM 08/08/2022   10:40 AM 07/28/2022    3:24 AM  CBC  WBC 4.0 - 10.5 K/uL 3.4  3.4  3.8   Hemoglobin 12.0 - 15.0 g/dL 78.2  95.6  21.3   Hematocrit 36.0 - 46.0 % 34.0  38.7  35.4   Platelets 150 - 400 K/uL 179  Comment  239        Latest Ref Rng & Units 09/01/2023    1:29 PM 08/08/2022   10:40 AM 07/28/2022    3:24 AM  CMP  Glucose 70 - 99 mg/dL 92  96  93   BUN 6 - 20 mg/dL 11  8  9    Creatinine 0.44 - 1.00 mg/dL 0.86  5.78  4.69   Sodium 135 - 145 mmol/L 138   140  138   Potassium 3.5 - 5.1 mmol/L 3.8  4.3  3.7   Chloride 98 - 111 mmol/L 106  103  105   CO2 22 - 32 mmol/L 27  21  26    Calcium 8.9 - 10.3 mg/dL 9.5  62.9  9.5  Total Protein 6.5 - 8.1 g/dL 7.3  8.1    Total Bilirubin 0.3 - 1.2 mg/dL 0.5  0.3    Alkaline Phos 38 - 126 U/L 42  48    AST 15 - 41 U/L 14  24    ALT 0 - 44 U/L 9  22       RADIOGRAPHIC STUDIES: I have personally reviewed the radiological images as listed and agreed with the findings in the report. No results found.  ASSESSMENT & PLAN Barbara Rice is a 27 y.o. female returns for a follow up for evaluation of leukopenia/neutropenia..   The differentials for neutropenia include benign ethnic neutropenia, infectious etiology, nutritional etiology, inflammatory etiology, or medication.  The patient is not currently taking any medications known to cause neutropenia.  We will order viral studies with hep B, hep C, and HIV as well.  In addition, we will check for nutritional anemias with B12 and folate levels.    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  This is a common condition whereby individuals of African or Mediterranean descent tend to have slightly lower white blood cell counts in the general population.  Hallmark of the syndrome is an ANC between 1000 and 1500 which this patient falls into.  This is of little to no clinical consequence.  This is a diagnosis of exclusion therefore we will do further work-up in order to ensure there is no other etiology.     # Leukopenia/Neutropenia  --Rule out infectious etiology with hepatitis B serologies, hepatitis C serologies, and HIV.  --Nutritional evaluation with  vitamin B12 and folate.  --Inflammatory evaluation with ESR and CRP.   --Repeat CBC and CMP.  --RTC based on above workup.   #Right inguinal lymphadenopathy: --Underwent FNA of right inguinal lymph node on 02/20/2022. Pathology was negative for malignant cells.    Orders Placed This  Encounter  Procedures   Folate, Serum    Standing Status:   Future    Number of Occurrences:   1    Standing Expiration Date:   08/31/2024   Vitamin B12    Standing Status:   Future    Number of Occurrences:   1    Standing Expiration Date:   08/31/2024   Ferritin    Standing Status:   Future    Number of Occurrences:   1    Standing Expiration Date:   08/31/2024   Hepatitis B core antibody, total    Standing Status:   Future    Number of Occurrences:   1    Standing Expiration Date:   08/31/2024   Hepatitis B surface antibody    Standing Status:   Future    Number of Occurrences:   1    Standing Expiration Date:   08/31/2024   Hepatitis B surface antigen    Standing Status:   Future    Number of Occurrences:   1    Standing Expiration Date:   08/31/2024   Sedimentation rate    Standing Status:   Future    Number of Occurrences:   1    Standing Expiration Date:   08/31/2024   C-reactive protein    Standing Status:   Future    Number of Occurrences:   1    Standing Expiration Date:   08/31/2024    All questions were answered. The patient knows to call the clinic with any problems, questions or concerns.  I have spent a total of  30 minutes minutes of face-to-face and non-face-to-face time, preparing to see the patient, performing a medically appropriate examination, counseling and educating the patient, ordering tests, documenting clinical information in the electronic health record, and care coordination.   Georga Kaufmann, PA-C Department of Hematology/Oncology [facility] Phone: [phone]

## 2023-09-01 NOTE — Patient Instructions (Signed)
It was nice meeting you today! You will see your results in the MyChart app within 1 week.   The best days to get pregnant are the 1-2 days before you ovulate and the day you ovulate.  You can predict the day of ovulation in 4 ways: Tracking with a calendar. This works best if you have extremely regular menstrual cycles. Tracking changes to cervical mucus. You are most likely to conceive when cervical mucus is clear and slippery. Tracking your body temperature. This is less helpful because your body temperature doesn't rise until AFTER you ovulated (too late to time intercourse) It is ideal to use a special basal body temperature thermometer since it can pick up subtle differences. You take your temperature first thing in the morning - before you get out of bed, use the bathroom, or eat/drink anything. You will need to chart your temperature every day to be able to pick up on a meaningful rise. Tracking with ovulation predictor kits. These are urine tests that you do at home. They pick up a hormone in your urine called "LH" which spikes right before you ovulate. You should time intercourse to the day of the [facility] spike. You want to start testing in the 5 days before you think you'll ovulate, but the kits usually come with instructions that explain when to use them.  If you are trying to get pregnant, take a prenatal vitamin every day. They work best to help your baby's brain & spinal cord growth if you're taking them BEFORE you get pregnant.

## 2023-09-01 NOTE — Progress Notes (Signed)
27 y.o. GYN presents for AEX.  Pt declined STD screening.

## 2023-09-01 NOTE — Progress Notes (Signed)
ANNUAL EXAM Patient name: Barbara Rice MRN [id number]  Date of birth: January 28, 1996 Chief Complaint:   Gynecologic Exam  History of Present Illness:   Barbara Rice is a 27 y.o. G0P0000 with Patient's last menstrual period was 08/09/2023 (exact date). being seen today for a routine annual exam.  Current complaints: R breast firmness and axillary fullness  Currently trying to conceive  The pregnancy intention screening data noted above was reviewed. Potential methods of contraception were discussed. The patient elected to proceed with No Contraception Precautions.   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 Has not had a pap since Last mammogram: n/a. No fhx Last colonoscopy: n/a no fhx HPV vaccine: no     09/01/2023   10:33 AM 08/15/2022   11:27 AM 08/08/2022    9:54 AM 06/04/2022   11:00 AM 04/21/2022    3:27 PM  Depression screen PHQ 2/9  Decreased Interest 0 1 0 0 0  Down, Depressed, Hopeless 0 1 0 0 0  PHQ - 2 Score 0 2 0 0 0  Altered sleeping 0 0 2    Tired, decreased energy 1 1 3     Change in appetite 1 3 0    Feeling bad or failure about yourself  0 0     Trouble concentrating 1 3 0    Moving slowly or fidgety/restless 0 0 0    Suicidal thoughts 0 0 0    PHQ-9 Score 3 9 5     Difficult doing work/chores Not difficult at all Somewhat difficult           09/01/2023   10:37 AM 08/15/2022   11:23 AM  GAD 7 : Generalized Anxiety Score  Nervous, Anxious, on Edge 1 1  Control/stop worrying 1 0  Worry too much - different things 1 1  Trouble relaxing 0 0  Restless 0 1  Easily annoyed or irritable 0 0  Afraid - awful might happen 0 0  Total GAD 7 Score 3 3  Anxiety Difficulty Not difficult at all Somewhat difficult     Review of Systems:   Pertinent items are noted in HPI Denies any headaches, blurred vision, fatigue, shortness of breath, chest pain, abdominal pain, abnormal vaginal discharge/itching/odor/irritation, problems with periods,  bowel movements, urination, or intercourse unless otherwise stated above. Pertinent History Reviewed:  Reviewed past medical,surgical, social and family history.  Reviewed problem list, medications and allergies. Physical Assessment:   Vitals:   09/01/23 1026  BP: 115/79  Pulse: 89  Weight: 176 lb (79.8 kg)  Height: 5\' 7"  (1.702 m)  Body mass index is 27.57 kg/m.        Physical Examination:   General appearance - well appearing, and in no distress  Mental status - alert, oriented to person, place, and time  Chest - respiratory effort normal  Heart - normal peripheral perfusion  Breasts - breasts appear normal, no suspicious masses, no skin or nipple changes. Enlarged R axillary lymph nodes.   Abdomen - soft, nontender, nondistended, no masses or organomegaly  Pelvic - VULVA: normal appearing vulva with no masses, tenderness or lesions  VAGINA: normal appearing vagina with normal color and discharge, no lesions  CERVIX: normal appearing cervix without discharge or lesions, no CMT  Thin prep pap is done with HR HPV cotesting  Chaperone present for exam  No results found for this or any previous visit (from the past 24 hour(s)).  Assessment & Plan:  1) Well-Woman Exam  Mammogram: breast US ordered Colonoscopy: @ 27yo, or sooner if problems Pap: Collected Gardasil: 1st dose given today GC/CT: declines HIV/HCV: declines HIV, accepts HCV  2) Preconception counseling Discussed best time to conceive each cycle Recommended PNV  Labs/procedures today:   Orders Placed This Encounter  Procedures   [place] LIMITED ULTRASOUND INCLUDING AXILLA RIGHT BREAST   HPV 9-valent vaccine,Recombinat   Hepatitis C Antibody   Meds: No orders of the defined types were placed in this encounter.  Follow-up: Return in about 2 months (around 11/01/2023) for gardasil shot #2.  Lennart Pall, MD 09/04/2023 6:20 PM

## 2023-09-02 LAB — HEPATITIS C ANTIBODY: Hep C Virus Ab: NONREACTIVE

## 2023-09-02 LAB — FERRITIN: Ferritin: 21 ng/mL (ref 11–307)

## 2023-09-03 LAB — CYTOLOGY - PAP
Comment: NEGATIVE
Diagnosis: NEGATIVE
High risk HPV: NEGATIVE

## 2023-09-11 ENCOUNTER — Other Ambulatory Visit: Payer: Self-pay | Admitting: Physician Assistant

## 2023-09-11 ENCOUNTER — Telehealth: Payer: Self-pay | Admitting: Physician Assistant

## 2023-09-11 MED ORDER — FERROUS SULFATE 325 (65 FE) MG PO TBEC: 325.0000 mg | DELAYED_RELEASE_TABLET | Freq: Every day | ORAL | 6 refills | Status: DC

## 2023-09-15 ENCOUNTER — Encounter: Payer: Self-pay | Admitting: Internal Medicine

## 2023-09-15 ENCOUNTER — Other Ambulatory Visit: Payer: 59

## 2023-09-23 ENCOUNTER — Ambulatory Visit
Admission: RE | Admit: 2023-09-23 | Discharge: 2023-09-23 | Disposition: A | Discharge status: Home / Self Care | Payer: 59 | Source: Ambulatory Visit | Attending: Obstetrics and Gynecology | Admitting: Obstetrics and Gynecology

## 2023-09-23 DIAGNOSIS — R2231 Localized swelling, mass and lump, right upper limb: Secondary | ICD-10-CM

## 2023-09-23 DIAGNOSIS — N6311 Unspecified lump in the right breast, upper outer quadrant: Secondary | ICD-10-CM | POA: Diagnosis not present

## 2023-11-02 ENCOUNTER — Ambulatory Visit: Payer: 59

## 2023-11-04 ENCOUNTER — Ambulatory Visit (INDEPENDENT_AMBULATORY_CARE_PROVIDER_SITE_OTHER): Payer: 59 | Admitting: Emergency Medicine

## 2023-11-04 DIAGNOSIS — Z23 Encounter for immunization: Secondary | ICD-10-CM | POA: Diagnosis not present

## 2023-11-04 NOTE — Progress Notes (Signed)
Pt presents for Gardasil vaccine #2.

## 2024-03-03 ENCOUNTER — Ambulatory Visit: Payer: 59

## 2024-03-03 ENCOUNTER — Ambulatory Visit (INDEPENDENT_AMBULATORY_CARE_PROVIDER_SITE_OTHER)

## 2024-03-03 DIAGNOSIS — Z23 Encounter for immunization: Secondary | ICD-10-CM

## 2024-03-03 NOTE — Progress Notes (Signed)
 Barbara Rice is here for their 3rd Gardasil injection. Pt denies any issues at this time. Pt tolerated injection well. Pt has completed injections.

## 2024-03-07 ENCOUNTER — Other Ambulatory Visit: Payer: Self-pay | Admitting: Physician Assistant

## 2024-03-07 DIAGNOSIS — D72818 Other decreased white blood cell count: Secondary | ICD-10-CM

## 2024-03-08 ENCOUNTER — Inpatient Hospital Stay (HOSPITAL_BASED_OUTPATIENT_CLINIC_OR_DEPARTMENT_OTHER): Payer: 59 | Admitting: Physician Assistant

## 2024-03-08 ENCOUNTER — Inpatient Hospital Stay: Payer: 59 | Attending: Physician Assistant

## 2024-03-08 VITALS — BP 126/82 | HR 78 | Temp 97.5°F | Resp 13 | Wt 170.4 lb

## 2024-03-08 DIAGNOSIS — D72818 Other decreased white blood cell count: Secondary | ICD-10-CM

## 2024-03-08 DIAGNOSIS — D500 Iron deficiency anemia secondary to blood loss (chronic): Secondary | ICD-10-CM

## 2024-03-08 DIAGNOSIS — N920 Excessive and frequent menstruation with regular cycle: Secondary | ICD-10-CM | POA: Diagnosis not present

## 2024-03-08 DIAGNOSIS — Z79899 Other long term (current) drug therapy: Secondary | ICD-10-CM | POA: Insufficient documentation

## 2024-03-08 DIAGNOSIS — D709 Neutropenia, unspecified: Secondary | ICD-10-CM | POA: Insufficient documentation

## 2024-03-08 DIAGNOSIS — R590 Localized enlarged lymph nodes: Secondary | ICD-10-CM | POA: Insufficient documentation

## 2024-03-08 LAB — CMP (CANCER CENTER ONLY)
ALT: 9 U/L (ref 0–44)
AST: 17 U/L (ref 15–41)
Albumin: 4.7 g/dL (ref 3.5–5.0)
Alkaline Phosphatase: 39 U/L (ref 38–126)
Anion gap: 6 (ref 5–15)
BUN: 7 mg/dL (ref 6–20)
CO2: 28 mmol/L (ref 22–32)
Calcium: 9.4 mg/dL (ref 8.9–10.3)
Chloride: 105 mmol/L (ref 98–111)
Creatinine: 0.79 mg/dL (ref 0.44–1.00)
GFR, Estimated: >60 mL/min (ref >60)
Glucose, Bld: 97 mg/dL (ref 70–99)
Potassium: 4.3 mmol/L (ref 3.5–5.1)
Sodium: 139 mmol/L (ref 135–145)
Total Bilirubin: 0.6 mg/dL (ref 0.0–1.2)
Total Protein: 7.4 g/dL (ref 6.5–8.1)

## 2024-03-08 LAB — CBC WITH DIFFERENTIAL (CANCER CENTER ONLY)
Abs Immature Granulocytes: 0 10*3/uL (ref 0.00–0.07)
Basophils Absolute: 0 10*3/uL (ref 0.0–0.1)
Basophils Relative: 1 %
Eosinophils Absolute: 0 10*3/uL (ref 0.0–0.5)
Eosinophils Relative: 1 %
HCT: 35.5 % — ABNORMAL LOW (ref 36.0–46.0)
Hemoglobin: 11.8 g/dL — ABNORMAL LOW (ref 12.0–15.0)
Immature Granulocytes: 0 %
Lymphocytes Relative: 52 %
Lymphs Abs: 1.6 10*3/uL (ref 0.7–4.0)
MCH: 29 pg (ref 26.0–34.0)
MCHC: 33.2 g/dL (ref 30.0–36.0)
MCV: 87.2 fL (ref 80.0–100.0)
Monocytes Absolute: 0.2 10*3/uL (ref 0.1–1.0)
Monocytes Relative: 7 %
Neutro Abs: 1.2 10*3/uL — ABNORMAL LOW (ref 1.7–7.7)
Neutrophils Relative %: 39 %
Platelet Count: 217 10*3/uL (ref 150–400)
RBC: 4.07 MIL/uL (ref 3.87–5.11)
RDW: 11.9 % (ref 11.5–15.5)
WBC Count: 3 10*3/uL — ABNORMAL LOW (ref 4.0–10.5)
nRBC: 0 % (ref 0.0–0.2)

## 2024-03-08 LAB — FERRITIN: Ferritin: 21 ng/mL (ref 11–307)

## 2024-03-08 LAB — IRON AND IRON BINDING CAPACITY (CC-WL,HP ONLY)
Iron: 57 ug/dL (ref 28–170)
Saturation Ratios: 15 % (ref 10.4–31.8)
TIBC: 384 ug/dL (ref 250–450)
UIBC: 327 ug/dL (ref 148–442)

## 2024-03-08 NOTE — Progress Notes (Signed)
 [facility] [phone]   [phone]  PROGRESS NOTE:  Patient Care Team: Collene Mares, [place] as PCP - General (Internal Medicine)  CHIEF COMPLAINTS/PURPOSE OF CONSULTATION:  Leukopenia/neutropenia. Iron deficiency anemia  HISTORY OF PRESENTING ILLNESS:  Barbara Rice 28 y.o. female returns to the clinic for a follow up for evaluation of leukopenia. And iron deficiency anemia. She is unaccompanied for this visit.   On exam today, Barbara Rice denies any changes to her health. Her energy and appetite are unchanged. She denies nausea, vomiting or bowel habit changes. She reports some constipation due to iron pills. She denies any signs of bleeding except for her monthly menstrual cycles which are regular. She denies fevers, chills, sweats, shortness of breath, chest pain, cough, urinary symptoms or vaginal symptoms. She has no other complaints. Remaining 10 point ROS is below.   MEDICAL HISTORY:  Past Medical History:  Diagnosis Date   Sciatic nerve pain     SURGICAL HISTORY: No past surgical history on file.  SOCIAL HISTORY: Social History   Socioeconomic History   Marital status: Married    Spouse name: Not on file   Number of children: Not on file   Years of education: Not on file   Highest education level: Not on file  Occupational History   Not on file  Tobacco Use   Smoking status: Never   Smokeless tobacco: Never  Vaping Use   Vaping status: Never Used  Substance and Sexual Activity   Alcohol use: Never   Drug use: Never   Sexual activity: Not Currently    Birth control/protection: None  Other Topics Concern   Not on file  Social History Narrative   Not on file   Social Drivers of Health   Financial Resource Strain: Not on file  Food Insecurity: Not on file  Transportation Needs: Not on file  Physical Activity: Not on file  Stress: Not on file  Social Connections: Unknown (05/13/2022)   Received from [facility]   Social Network    Social Network: Not on file  Intimate Partner Violence: Unknown (04/04/2022)   Received from [facility], [facility]   HITS    Physically Hurt: Not on file    Insult or Talk Down To: Not on file    Threaten Physical Harm: Not on file    Scream or Curse: Not on file    FAMILY HISTORY: Family History  Problem Relation Age of Onset   Prostatitis Father    Hypertension Father    Cervical cancer Paternal Aunt    Ovarian cancer Paternal Aunt    Cancer Cousin     ALLERGIES:  is allergic to pork-derived products.  MEDICATIONS:  Current Outpatient Medications  Medication Sig Dispense Refill   Cholecalciferol (VITAMIN D-3 PO) Take by mouth.     ferrous sulfate 325 (65 FE) MG EC tablet Take 1 tablet (325 mg total) by mouth daily with breakfast. 30 tablet 6   Polyethylene Glycol 3350 (MIRALAX PO) Take by mouth.     acetaminophen-codeine (TYLENOL #3) 300-30 MG tablet Take 1 tablet by mouth every 6 (six) hours as needed for moderate pain. (Patient not taking: Reported on 03/08/2024)     cyclobenzaprine (FLEXERIL) 5 MG tablet Take 1 tablet (5 mg total) by mouth 3 (three) times daily as needed for muscle spasms. (Patient not taking: Reported on 03/03/2024) 20 tablet 0   diclofenac (VOLTAREN) 75 MG EC tablet Take 1 tablet (75 mg  total) by mouth 2 (two) times daily. (Patient not taking: Reported on 03/08/2024) 30 tablet 2   hydrochlorothiazide (MICROZIDE) 12.5 MG capsule Take 1 capsule (12.5 mg total) by mouth daily. 30 capsule 0   hydrOXYzine (ATARAX) 10 MG tablet Take 1 tablet (10 mg total) by mouth 3 (three) times daily as needed. (Patient not taking: Reported on 03/08/2024) 30 tablet 0   ibuprofen (ADVIL) 200 MG tablet Take 400 mg by mouth every 6 (six) hours as needed for mild pain. (Patient not taking: Reported on 03/08/2024)     methylPREDNISolone (MEDROL DOSEPAK) 4 MG TBPK tablet 6 day dose pack - take as directed (Patient not taking: Reported on  11/04/2023) 21 tablet 0   ondansetron (ZOFRAN-ODT) 4 MG disintegrating tablet Take 1 tablet (4 mg total) by mouth every 8 (eight) hours as needed for nausea or vomiting. (Patient not taking: Reported on 03/08/2024) 20 tablet 0   Sodium Fluoride 0.55 (0.25 F) MG/DROP SOLN 5 mLs by Mouth Rinse route at bedtime. (Patient not taking: Reported on 11/04/2023)     No current facility-administered medications for this visit.    REVIEW OF SYSTEMS:   Constitutional: ( - ) fevers, ( - )  chills , ( - ) night sweats Eyes: ( - ) blurriness of vision, ( - ) double vision, ( - ) watery eyes Ears, nose, mouth, throat, and face: ( - ) mucositis, ( - ) sore throat Respiratory: ( - ) cough, ( - ) dyspnea, ( - ) wheezes Cardiovascular: ( - ) palpitation, ( - ) chest discomfort, ( - ) lower extremity swelling Gastrointestinal:  ( - ) nausea, ( - ) heartburn, ( - ) change in bowel habits Skin: ( - ) abnormal skin rashes Lymphatics: ( - ) new lymphadenopathy, ( - ) easy bruising Neurological: ( - ) numbness, ( - ) tingling, ( - ) new weaknesses Behavioral/Psych: ( - ) mood change, ( - ) new changes  All other systems were reviewed with the patient and are negative.  PHYSICAL EXAMINATION: ECOG PERFORMANCE STATUS: 1 - Symptomatic but completely ambulatory  Vitals:   03/08/24 1347  BP: 126/82  Pulse: 78  Resp: 13  Temp: (!) 97.5 F (36.4 C)  SpO2: 100%    Filed Weights   03/08/24 1347  Weight: 170 lb 6.4 oz (77.3 kg)     GENERAL: well appearing female in NAD  SKIN: skin color, texture, turgor are normal, no rashes or significant lesions EYES: conjunctiva are pink and non-injected, sclera clear NECK: supple, non-tender LYMPH:  no palpable lymphadenopathy in the cervical or supraclavicular lymph nodes.  LUNGS: clear to auscultation and percussion with normal breathing effort HEART: regular rate & rhythm and no murmurs. No lower extremity edema.  Musculoskeletal: no cyanosis of digits and no clubbing   PSYCH: alert & oriented x 3, fluent speech NEURO: no focal motor/sensory deficits  LABORATORY DATA:  I have reviewed the data as listed    Latest Ref Rng & Units 03/08/2024    1:22 PM 09/01/2023    1:29 PM 08/08/2022   10:40 AM  CBC  WBC 4.0 - 10.5 K/uL 3.0  3.4  3.4   Hemoglobin 12.0 - 15.0 g/dL 16.1  09.6  04.5   Hematocrit 36.0 - 46.0 % 35.5  34.0  38.7   Platelets 150 - 400 K/uL 217  179  Comment        Latest Ref Rng & Units 03/08/2024    1:22 PM 09/01/2023  1:29 PM 08/08/2022   10:40 AM  CMP  Glucose 70 - 99 mg/dL 97  92  96   BUN 6 - 20 mg/dL 7  11  8    Creatinine 0.44 - 1.00 mg/dL 1.19  1.47  8.29   Sodium 135 - 145 mmol/L 139  138  140   Potassium 3.5 - 5.1 mmol/L 4.3  3.8  4.3   Chloride 98 - 111 mmol/L 105  106  103   CO2 22 - 32 mmol/L 28  27  21    Calcium 8.9 - 10.3 mg/dL 9.4  9.5  56.2   Total Protein 6.5 - 8.1 g/dL 7.4  7.3  8.1   Total Bilirubin 0.0 - 1.2 mg/dL 0.6  0.5  0.3   Alkaline Phos 38 - 126 U/L 39  42  48   AST 15 - 41 U/L 17  14  24    ALT 0 - 44 U/L 9  9  22       RADIOGRAPHIC STUDIES: I have personally reviewed the radiological images as listed and agreed with the findings in the report. No results found.  ASSESSMENT & PLAN Barbara Rice is a 28 y.o. female returns for a follow up for evaluation of leukopenia/neutropenia and iron deficiency anemia.   # Leukopenia/Neutropenia  --Workup from 09/01/2023 ruled out nutritional deficiencies, hepatitis B/C, HIV and inflammatory process.  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  --Labs today WBC 3.0, ANC 1.2 --No indication for bone marrow biopsy at this time  # Iron Deficiency Anemia 2/2 to GYN Bleeding -- Findings are consistent with iron deficiency anemia secondary to patient's menorrhagia --Encouraged her to follow-up with OB/GYN for better control of her menstrual cycles --Continue ferrous sulfate 325 mg daily with a source of vitamin  C --Labs today show mild anemia with Hgb 11.8, MCV 87.2. Iron panel shows ferritin 21, iron 57, TIBC 384, saturation 15%. --No need for IV iron at this time. Continue with PO replacement at this time.   #Right inguinal lymphadenopathy: --Underwent FNA of right inguinal lymph node on 02/20/2022. Pathology was negative for malignant cells.   Follow up: --6 months: labs and follow up.  No orders of the defined types were placed in this encounter.   All questions were answered. The patient knows to call the clinic with any problems, questions or concerns.  I have spent a total of 30 minutes minutes of face-to-face and non-face-to-face time, preparing to see the patient, performing a medically appropriate examination, counseling and educating the patient, ordering tests, documenting clinical information in the electronic health record, and care coordination.   Georga Kaufmann, PA-C Department of Hematology/Oncology [facility] Phone: [phone]

## 2024-03-09 ENCOUNTER — Other Ambulatory Visit: Payer: Self-pay | Admitting: Physician Assistant

## 2024-03-10 ENCOUNTER — Telehealth: Payer: Self-pay

## 2024-03-10 NOTE — Telephone Encounter (Signed)
-----   Message from Briant Cedar sent at 03/10/2024  9:24 AM EDT ----- Her iron hasn't improved with iron pills she reports taking. Please call and see if she is willing to try IV iron to bolster her levels. ----- Message ----- From: Leory Plowman, Lab In [place] Sent: 03/08/2024   1:37 PM EDT To: Briant Cedar, PA-C

## 2024-03-10 NOTE — Telephone Encounter (Signed)
 Pt advised of lab results and recommendations.  She has agreed to IV iron and is aware scheduling will call her after insurance approval

## 2024-03-11 ENCOUNTER — Telehealth: Payer: Self-pay

## 2024-03-11 ENCOUNTER — Other Ambulatory Visit: Payer: Self-pay | Admitting: Physician Assistant

## 2024-03-11 DIAGNOSIS — D509 Iron deficiency anemia, unspecified: Secondary | ICD-10-CM | POA: Insufficient documentation

## 2024-03-11 DIAGNOSIS — D500 Iron deficiency anemia secondary to blood loss (chronic): Secondary | ICD-10-CM

## 2024-03-11 NOTE — Telephone Encounter (Signed)
 [name], patient will be scheduled as soon as possible.  Auth Submission: NO AUTH NEEDED Site of care: Site of care: [facility] Payer: [employer] commercial Medication & CPT/J Code(s) submitted: Venofer (Iron Sucrose) J1756 Route of submission (phone, fax, portal):  Phone # Fax # Auth type: Buy/Bill PB Units/visits requested: 200mg  x 2 doses Reference number:  Approval from: 03/11/24 to 09/11/24

## 2024-03-30 ENCOUNTER — Ambulatory Visit (INDEPENDENT_AMBULATORY_CARE_PROVIDER_SITE_OTHER)

## 2024-03-30 VITALS — BP 123/79 | HR 62 | Temp 97.9°F | Resp 18 | Ht 67.0 in | Wt 171.0 lb

## 2024-03-30 DIAGNOSIS — D500 Iron deficiency anemia secondary to blood loss (chronic): Secondary | ICD-10-CM

## 2024-03-30 DIAGNOSIS — N920 Excessive and frequent menstruation with regular cycle: Secondary | ICD-10-CM | POA: Diagnosis not present

## 2024-03-30 MED ORDER — IRON SUCROSE 20 MG/ML IV SOLN
200.0000 mg | Freq: Once | INTRAVENOUS | Status: AC
  Administered 2024-03-30: 200 mg via INTRAVENOUS
  Filled 2024-03-30: qty 10

## 2024-03-30 NOTE — Progress Notes (Signed)
 Diagnosis: Iron Deficiency Anemia  Provider:  Chilton Greathouse MD  Procedure: IV Push  IV Type: Peripheral, IV Location: L Antecubital  Venofer (Iron Sucrose), Dose: 200 mg  Post Infusion IV Care: Observation period completed and Peripheral IV Discontinued  Discharge: Condition: Good, Destination: Home . AVS Provided  Performed by:  Adriana Mccallum, RN

## 2024-03-30 NOTE — Patient Instructions (Addendum)

## 2024-04-06 ENCOUNTER — Ambulatory Visit (INDEPENDENT_AMBULATORY_CARE_PROVIDER_SITE_OTHER)

## 2024-04-06 VITALS — BP 127/80 | HR 65 | Temp 98.6°F | Resp 16 | Ht 66.0 in | Wt 170.0 lb

## 2024-04-06 DIAGNOSIS — N920 Excessive and frequent menstruation with regular cycle: Secondary | ICD-10-CM

## 2024-04-06 DIAGNOSIS — D500 Iron deficiency anemia secondary to blood loss (chronic): Secondary | ICD-10-CM

## 2024-04-06 MED ORDER — IRON SUCROSE 20 MG/ML IV SOLN
200.0000 mg | Freq: Once | INTRAVENOUS | Status: AC
  Administered 2024-04-06: 200 mg via INTRAVENOUS
  Filled 2024-04-06: qty 10

## 2024-04-06 NOTE — Progress Notes (Signed)
 Diagnosis: Iron Deficiency Anemia  Provider:  Chilton Greathouse MD  Procedure: IV Push  IV Type: Peripheral, IV Location: L Antecubital  Venofer (Iron Sucrose), Dose: 200 mg  Post Infusion IV Care: Observation period completed and Peripheral IV Discontinued  Discharge: Condition: Good, Destination: Home . AVS Declined  Performed by:  Rico Ala, LPN

## 2024-04-18 ENCOUNTER — Ambulatory Visit (HOSPITAL_COMMUNITY): Admission: EM | Admit: 2024-04-18 | Discharge: 2024-04-18 | Disposition: A | Discharge status: Home / Self Care

## 2024-04-18 ENCOUNTER — Encounter (HOSPITAL_COMMUNITY): Payer: Self-pay | Admitting: Emergency Medicine

## 2024-04-18 DIAGNOSIS — M7989 Other specified soft tissue disorders: Secondary | ICD-10-CM | POA: Diagnosis not present

## 2024-04-18 DIAGNOSIS — M79604 Pain in right leg: Secondary | ICD-10-CM | POA: Diagnosis not present

## 2024-04-18 HISTORY — DX: Anemia, unspecified: D64.9

## 2024-04-18 NOTE — Discharge Instructions (Signed)
 We have drawn some labs today to rule out underlying causes for your leg swelling.  If anything is concerning we will call you and advise treatment at that time.  Otherwise alternate between ibuprofen  and Tylenol  every 4-6 hours as needed for pain.  Alternate between ice and heat to help with pain and swelling.  Continue to wear compression socks to help reduce swelling.  If your symptoms persist or worsen follow-up with your primary care doctor or return here for reevaluation.

## 2024-04-18 NOTE — ED Notes (Addendum)
 Patient came out to desk stating that she was supposed to get blood work done or she can go. This RN got patient's discharge AVS out of basket and asked if she wanted to go back in the room to go over the paperwork. Pt states, "she said something about getting blood work done or I am going to leave". This RN informed patient that was helping another patient and apologize for delay. Informed patient that I could get stuff for her blood draw, patient states, "I am just going to leave". Patient took discharge paperwork and left.   Will make Carley, NP aware.

## 2024-04-18 NOTE — ED Triage Notes (Signed)
 Pt reports that she "has leg problem". Last 4 days having pain from right foot to knee. Pt reports feels like something being squeezed. Denies any fall or injury. Tried ice.

## 2024-04-19 NOTE — ED Provider Notes (Addendum)
 [facility]    CSN: [id number] Arrival date & time: 04/18/24  1510      History   Chief Complaint Chief Complaint  Patient presents with   Leg Pain    HPI Barbara Rice is a 28 y.o. female.   Patient presents with right lower leg pain and intermittent swelling x 4 days.  Patient states that it feels like her leg is being squeezed.  Denies any discoloration to her leg.  Denies any falls or injuries.  Denies difficulty walking.  Patient reports applying ice without relief.  Denies taking any medication for symptoms.  Per patient's chart she was seen for something similar to her left leg in 2022 and received a vascular study which did not reveal any evidence of DVT.  The history is provided by the patient and medical records.  Leg Pain   Past Medical History:  Diagnosis Date   Anemia    Sciatic nerve pain     Patient Active Problem List   Diagnosis Date Noted   Iron  deficiency anemia 03/11/2024   H/O LEEP 08/12/2022   Seizure-like activity (HCC) 07/25/2022   Normocytic anemia 07/25/2022   Loss of consciousness (HCC) 07/24/2022   LGSIL on Pap smear of cervix 04/30/2022   Inguinal lymphadenopathy 01/26/2022    History reviewed. No pertinent surgical history.  OB History     Gravida  0   Para  0   Term  0   Preterm  0   AB  0   Living  0      SAB  0   IAB  0   Ectopic  0   Multiple  0   Live Births  0            Home Medications    Prior to Admission medications   Medication Sig Start Date End Date Taking? Authorizing Provider  Cholecalciferol (VITAMIN D-3 PO) Take by mouth.    [provider]  ferrous sulfate  325 (65 FE) MG EC tablet Take 1 tablet (325 mg total) by mouth daily with breakfast. 09/11/23   Darilyn Edin, PA-C  hydrochlorothiazide  (MICROZIDE ) 12.5 MG capsule Take 1 capsule (12.5 mg total) by mouth daily. 08/08/22 09/07/22  Jerrlyn Morel, NP  hydrOXYzine  (ATARAX ) 10 MG tablet Take 1 tablet (10 mg  total) by mouth 3 (three) times daily as needed. Patient not taking: Reported on 03/08/2024 08/08/22   Jerrlyn Morel, NP  Polyethylene Glycol 3350 (MIRALAX PO) Take by mouth.    [provider]    Family History Family History  Problem Relation Age of Onset   Prostatitis Father    Hypertension Father    Cervical cancer Paternal Aunt    Ovarian cancer Paternal Aunt    Cancer Cousin     Social History Social History   Tobacco Use   Smoking status: Never   Smokeless tobacco: Never  Vaping Use   Vaping status: Never Used  Substance Use Topics   Alcohol use: Never   Drug use: Never     Allergies   Pork-derived products   Review of Systems Review of Systems  Per HPI  Physical Exam Triage Vital Signs ED Triage Vitals  Encounter Vitals Group     BP 04/18/24 1623 126/82     Systolic BP Percentile --      Diastolic BP Percentile --      Pulse Rate 04/18/24 1623 88     Resp 04/18/24 1623 14  Temp 04/18/24 1623 98.8 F (37.1 C)     Temp Source 04/18/24 1623 Oral     SpO2 04/18/24 1623 97 %     Weight --      Height --      Head Circumference --      Peak Flow --      Pain Score 04/18/24 1621 8     Pain Loc --      Pain Education --      Exclude from Growth Chart --    No data found.  Updated Vital Signs BP 126/82 (BP Location: Right Arm)   Pulse 88   Temp 98.8 F (37.1 C) (Oral)   Resp 14   LMP 04/16/2024   SpO2 97%   Visual Acuity Right Eye Distance:   Left Eye Distance:   Bilateral Distance:    Right Eye Near:   Left Eye Near:    Bilateral Near:     Physical Exam Vitals and nursing note reviewed.  Constitutional:      General: She is awake. She is not in acute distress.    Appearance: Normal appearance. She is well-developed and well-groomed. She is not ill-appearing.  Cardiovascular:     Rate and Rhythm: Normal rate and regular rhythm.     Pulses: Normal pulses.     Heart sounds: Normal heart sounds.  Pulmonary:     Effort:  Pulmonary effort is normal.     Breath sounds: Normal breath sounds.  Musculoskeletal:     Right lower leg: Tenderness present. No swelling, deformity or bony tenderness. No edema.  Skin:    General: Skin is warm and dry.  Neurological:     Mental Status: She is alert.  Psychiatric:        Behavior: Behavior is cooperative.      UC Treatments / Results  Labs (all labs ordered are listed, but only abnormal results are displayed) Labs Reviewed  BASIC METABOLIC PANEL WITH GFR  CBC    EKG   Radiology No results found.  Procedures Procedures (including critical care time)  Medications Ordered in UC Medications - No data to display  Initial Impression / Assessment and Plan / UC Course  I have reviewed the triage vital signs and the nursing notes.  Pertinent labs & imaging results that were available during my care of the patient were reviewed by me and considered in my medical decision making (see chart for details).     Patient is well-appearing.  Vital stable.  Upon assessment mild tenderness noted to right lower leg without significant swelling or discoloration.  Unlikely DVT.  Ordered labs to rule out underlying causes of symptoms recommended following up with primary care provider.  Discussed follow-up and return precautions.  Upon discharge patient declined blood work. Final Clinical Impressions(s) / UC Diagnoses   Final diagnoses:  Right leg pain  Right leg swelling     Discharge Instructions      We have drawn some labs today to rule out underlying causes for your leg swelling.  If anything is concerning we will call you and advise treatment at that time.  Otherwise alternate between ibuprofen  and Tylenol  every 4-6 hours as needed for pain.  Alternate between ice and heat to help with pain and swelling.  Continue to wear compression socks to help reduce swelling.  If your symptoms persist or worsen follow-up with your primary care doctor or return here for  reevaluation.    ED Prescriptions  None    PDMP not reviewed this encounter.   Karon Packer, NP 04/19/24 1006    Levora Reas A, [place] 04/19/24 1008

## 2024-04-28 ENCOUNTER — Encounter: Payer: Self-pay | Admitting: Podiatry

## 2024-04-28 ENCOUNTER — Ambulatory Visit (INDEPENDENT_AMBULATORY_CARE_PROVIDER_SITE_OTHER)

## 2024-04-28 ENCOUNTER — Ambulatory Visit: Admitting: Podiatry

## 2024-04-28 VITALS — Ht 66.0 in | Wt 170.0 lb

## 2024-04-28 DIAGNOSIS — M62461 Contracture of muscle, right lower leg: Secondary | ICD-10-CM

## 2024-04-28 DIAGNOSIS — M76821 Posterior tibial tendinitis, right leg: Secondary | ICD-10-CM

## 2024-04-28 DIAGNOSIS — M7751 Other enthesopathy of right foot: Secondary | ICD-10-CM | POA: Diagnosis not present

## 2024-04-28 DIAGNOSIS — M76822 Posterior tibial tendinitis, left leg: Secondary | ICD-10-CM

## 2024-04-28 DIAGNOSIS — M08871 Other juvenile arthritis, right ankle and foot: Secondary | ICD-10-CM

## 2024-04-28 DIAGNOSIS — M2141 Flat foot [pes planus] (acquired), right foot: Secondary | ICD-10-CM

## 2024-04-28 DIAGNOSIS — M2142 Flat foot [pes planus] (acquired), left foot: Secondary | ICD-10-CM

## 2024-04-28 MED ORDER — MELOXICAM 15 MG PO TABS: 15.0000 mg | ORAL_TABLET | Freq: Every day | ORAL | 3 refills | Status: DC

## 2024-04-28 NOTE — Progress Notes (Signed)
 Subjective:  Patient ID: Barbara Rice, female    DOB: 1996-12-26,  MRN: [id number]  Chief Complaint  Patient presents with   Foot Swelling    Right foot swelling and pain X 3 weeks radiating up to knee. Went to [facility]. last week. Not helpful. Non diabetic.    Discussed the use of AI scribe software for clinical note transcription with the patient, who gave verbal consent to proceed.  History of Present Illness Barbara Rice is a 28 year old female who presents with right ankle pain and swelling.  She has been experiencing right ankle pain and swelling for approximately two weeks, with the pain extending to her right knee. She visited urgent care on April 18, 2024, where deep vein thrombosis was ruled out, and a basic metabolic panel and complete blood count were ordered, though the results are unavailable. No recent injuries, falls, or ankle sprains are reported.  The ankle is significantly swollen, while the knee experiences shooting pain without swelling. The swelling in the ankle is more pronounced than in the knee. She has been using Aleve for pain relief, which provides temporary relief until the next day. She attempts to keep pressure off the affected area, noting that the swelling reduces somewhat by the end of the day. Significant pain is present in her heel, described as severe and persistent even at rest.  She wears sneakers and says she uses inserts and has flat feet, which contribute to her discomfort. Pain is experienced in the plantar and posterior heel, as well as the sinus tarsi.  She is currently taking an iron  pill and denies any bleeding issues. There is no family history of arthritis, including rheumatoid arthritis or lupus.      Objective:    Physical Exam VASCULAR: DP and PT pulse palpable. Foot is warm and well-perfused. Capillary fill time is brisk. DERMATOLOGIC: Normal skin turgor, texture, and temperature. No open lesions, rashes, or  ulcerations. NEUROLOGIC: Normal sensation to light touch and pressure. No paresthesias. ORTHOPEDIC: Significant pes planus deformity. Mild tenderness on the PT tendon and at its insertion. Mild tenderness on anterior ankle joint line with edema and dorsal range of motion. Pain on the sinus tarsi and pain in the plantar and posterior heel. No ecchymosis or bruising.    No images are attached to the encounter.    Results Radiographs of the right foot and ankle today show pes planovalgus deformity with collapse of the midfoot with lowering of the arch decreased Calc inclination and increased talar inclination with talar head uncovering, no abnormality in ankle joint mortise is in normal alignment no OCD noted    Assessment:   1. Posterior tibial tendon dysfunction (PTTD) of both lower extremities   2. Other juvenile arthritis, right ankle and foot (HCC)   3. Pes planus of both feet   4. Gastrocnemius equinus of right lower extremity      Plan:  Patient was evaluated and treated and all questions answered.  Assessment and Plan Assessment & Plan Pes planus (flat feet) Significant pes planus deformity is contributing to tendonitis in the right ankle due to irritation and inflammation of the tendon along the side of the ankle. The joint may also be jamming due to the flatness, exacerbating the condition. She experiences significant pain in the heel and ankle, worsening with activity. - Apply ankle stabilizing lace-up ankle brace to offload the PT tendon and allow it to rest to facilitate soft tissue healing. - Continue supportive shoe gear. - Provide  home therapy plan focusing on PT tendon strengthening. - Refer to [facility] for further management.  Tendonitis of right ankle, ankle and knee pain and swelling Tendonitis in the right ankle is likely due to pes planus, causing inflammation of the tendon on the inside of the ankle. Associated with pain and swelling in  the right ankle, with shooting pain extending to the knee.  She notes swelling and pain in the knee as well.  Differential diagnosis includes inflammatory arthritis such as rheumatoid arthritis or lupus, which needs to be ruled out. She reports using Aleve for pain relief, which helps temporarily. She will be switched to meloxicam  to manage inflammation more effectively. - Order lab work including CBC, rheumatoid factor, anti-CCP, ESR, and HLA-B27 to rule out inflammatory arthritis. - Prescribe meloxicam  15 mg daily for one month to reduce inflammation. - Advise discontinuation of ibuprofen  and Aleve while on meloxicam . - Allow use of Tylenol  if additional pain relief is needed. - Monitor for potential side effects of meloxicam  such as heartburn. - Refer to [facility] for tendonitis management.      No follow-ups on file.

## 2024-04-28 NOTE — Patient Instructions (Signed)
 Call to schedule physical therapy: [facility] at [facility]  [phone]    VISIT SUMMARY: Today, you visited us  due to right ankle pain and swelling that has been ongoing for about two weeks. You also mentioned experiencing shooting pain extending to your right knee. We discussed your symptoms, reviewed your current medications, and examined your ankle and knee.  YOUR PLAN: -PES PLANUS (FLAT FEET): Pes planus, or flat feet, means that the arches on the inside of your feet are flattened, allowing the entire soles of your feet to touch the floor when you stand. This condition is contributing to tendonitis in your right ankle. To help manage this, you should apply a Trilac ankle stabilizing lace-up brace to support and rest the tendon. Continue wearing supportive shoes, follow a home therapy plan to strengthen the tendon, and attend physical therapy at [facility].  -TENDONITIS OF RIGHT ANKLE: Tendonitis is the inflammation of a tendon, which in your case is likely due to your flat feet. This is causing pain and swelling in your right ankle and shooting pain to your knee. We will switch your pain medication to meloxicam  15 mg daily for one month to better manage the inflammation. Please stop taking ibuprofen  and Aleve while on meloxicam , but you can use Tylenol  if you need additional pain relief. We will also conduct lab work to rule out any inflammatory arthritis and refer you to [facility] for further management.  INSTRUCTIONS: Please follow up with the lab work including CBC, rheumatoid factor, anti-CCP, ESR, and HLA-B27 to rule out inflammatory arthritis. Continue with the prescribed meloxicam  and monitor for any side effects such as heartburn. Attend your physical therapy sessions at [facility] as recommended.      Posterior Tibial Tendinitis  Posterior  tibial tendinitis is irritation of a tendon called the posterior tibial tendon. Your posterior tibial tendon is a cord-like tissue that connects bones of your lower leg and foot to a muscle that: Supports your arch. Helps you raise up on your toes. Helps you turn your foot down and in. This condition causes foot and ankle pain. It can also lead to a flat foot. What are the causes? This condition is most often caused by repeated stress to the tendon (overuse injury). It can also be caused by a sudden injury that stresses the tendon, such as landing on your foot after jumping or falling. What increases the risk? This condition is more likely to develop in: People who play a sport that involves putting a lot of pressure on the feet, such as: Basketball. Tennis. Soccer. Hockey. Runners. Females who are older than 30 years of age and are overweight. People with diabetes. People with decreased foot stability. People with flat feet. What are the signs or symptoms? Symptoms include: Pain in the inner ankle. Pain at the arch of your foot. Pain that gets worse with running, walking, or standing. Swelling on the inside of your ankle and foot. Weakness in your ankle or foot. Inability to stand up on tiptoe. Flattening of the arch of your foot. How is this diagnosed? This condition may be diagnosed based on: Your symptoms. Your medical history. A physical exam. Tests, such as: X-ray. MRI. Ultrasound. How is this treated? This condition may be treated by: Putting ice to the injured area. Taking NSAIDs, such as ibuprofen , to reduce pain and swelling. Wearing a special shoe or shoe insert to  support your arch (orthotic). Having physical therapy. Replacing high-impact exercise with low-impact exercise, such as swimming or cycling. If your symptoms do not improve with these treatments, you may need to wear a splint, removable walking boot, or short leg cast for 6-8 weeks to keep your foot  and ankle still (immobilized). Follow these instructions at home: If you have a cast, splint, or boot: Keep it clean and dry. Check the skin around it every day. Tell your health care provider about any concerns. If you have a cast: Do not stick anything inside it to scratch your skin. Doing that increases your risk of infection. You may put lotion on dry skin around the edges of the cast. Do not put lotion on the skin underneath the cast. If you have a splint or boot: Wear it as told by your health care provider. Remove it only as told by your health care provider. Loosen it if your toes tingle, become numb, or turn cold and blue. Bathing Do not take baths, swim, or use a hot tub until your health care provider approves. Ask your health care provider if you may take showers. If your cast, splint, or boot is not waterproof: Do not let it get wet. Cover it with a waterproof covering while you take a bath or a shower. Managing pain and swelling   If directed, put ice on the injured area. If you have a removable splint or boot, remove it as told by your health care provider. Put ice in a plastic bag. Place a towel between your skin and the bag or between your cast and the bag. Leave the ice on for 20 minutes, 2-3 times a day. Move your toes often to reduce stiffness and swelling. Raise (elevate) the injured area above the level of your heart while you are sitting or lying down. Activity Do not use the injured foot to support your body weight until your health care provider says that you can. Use crutches as told by your health care provider. Do not do activities that make pain or swelling worse. Ask your health care provider when it is safe to drive if you have a cast, splint, or boot on your foot. Return to your normal activities as told by your health care provider. Ask your health care provider what activities are safe for you. Do exercises as told by your health care  provider. General instructions Take over-the-counter and prescription medicines only as told by your health care provider. If you have an orthotic, use it as told by your health care provider. Keep all follow-up visits as told by your health care provider. This is important. How is this prevented? Wear footwear that is appropriate to your athletic activity. Avoid athletic activities that cause pain or swelling in your ankle or foot. Before being active, do range-of-motion and stretching exercises. If you develop pain or swelling while training, stop training. If you have pain or swelling that does not improve after a few days of rest, see your health care provider. If you start a new athletic activity, start gradually so you can build up your strength and flexibility. Contact a health care provider if: Your symptoms get worse. Your symptoms do not improve in 6-8 weeks. You develop new, unexplained symptoms. Your splint, boot, or cast gets damaged. Summary Posterior tibial tendinitis is irritation of a tendon called the posterior tibial tendon. This condition is most often caused by repeated stress to the tendon (overuse injury). This condition causes foot  pain and ankle pain. It can also lead to a flat foot. This condition may be treated by not doing high-impact activities, applying ice, having physical therapy, wearing orthotics, and wearing a cast, splint, or boot if needed. This information is not intended to replace advice given to you by your health care provider. Make sure you discuss any questions you have with your health care provider. Document Revised: 04/12/2019 Document Reviewed: 02/17/2019 Elsevier Patient Education  2020 [employer]  Posterior Tibial Tendinitis Rehab Ask your health care provider which exercises are safe for you. Do exercises exactly as told by your health care provider and adjust them as directed. It is normal to feel mild stretching, pulling, tightness,  or discomfort as you do these exercises. Stop right away if you feel sudden pain or your pain gets worse. Do not begin these exercises until told by your health care provider. Stretching and range-of-motion exercises These exercises warm up your muscles and joints and improve the movement and flexibility in your ankle and foot. These exercises may also help to relieve pain. Standing wall calf stretch, knee straight   Stand with your hands against a wall. Extend your left / right leg behind you, and bend your front knee slightly. If directed, place a folded washcloth under the arch of your foot for support. Point the toes of your back foot slightly inward. Keeping your heels on the floor and your back knee straight, shift your weight toward the wall. Do not allow your back to arch. You should feel a gentle stretch in your upper left / right calf. Hold this position for 10 seconds. Repeat 10 times. Complete this exercise 2 times a day. Standing wall calf stretch, knee bent Stand with your hands against a wall. Extend your left / right leg behind you, and bend your front knee slightly. If directed, place a folded washcloth under the arch of your foot for support. Point the toes of your back foot slightly inward. Unlock your back knee so it is bent. Keep your heels on the floor. You should feel a gentle stretch deep in your lower left / right calf. Hold this position for 10 seconds. Repeat 10 times. Complete this exercise 2 times a day. Strengthening exercises These exercises build strength and endurance in your ankle and foot. Endurance is the ability to use your muscles for a long time, even after they get tired. Ankle inversion with band Secure one end of a rubber exercise band or tubing to a fixed object, such as a table leg or a pole, that will stay still when the band is pulled. Loop the other end of the band around the middle of your left / right foot. Sit on the floor facing the object  with your left / right leg extended. The band or tube should be slightly tense when your foot is relaxed. Leading with your big toe, slowly bring your left / right foot and ankle inward, toward your other foot (inversion). Hold this position for 10 seconds. Slowly return your foot to the starting position. Repeat 10 times. Complete this exercise 2 times a day. Towel curls   Sit in a chair on a non-carpeted surface, and put your feet on the floor. Place a towel in front of your feet. Keeping your heel on the floor, put your left / right foot on the towel. Pull the towel toward you by grabbing the towel with your toes and curling them under. Keep your heel on the floor  while you do this. Let your toes relax. Grab the towel with your toes again. Keep going until the towel is completely underneath your foot. Repeat 10 times. Complete this exercise 2 times a day. Balance exercise This exercise improves or maintains your balance. Balance is important in preventing falls. Single leg stand Without wearing shoes, stand near a railing or in a doorway. You may hold on to the railing or door frame as needed for balance. Stand on your left / right foot. Keep your big toe down on the floor and try to keep your arch lifted. If balancing in this position is too easy, try the exercise with your eyes closed or while standing on a pillow. Hold this position for 10 seconds. Repeat 10 times. Complete this exercise 2 times a day. This information is not intended to replace advice given to you by your health care provider. Make sure you discuss any questions you have with your health care provider.

## 2024-05-05 LAB — CBC WITH DIFFERENTIAL/PLATELET
Basophils Absolute: 0 10*3/uL (ref 0.0–0.2)
Basos: 1 %
EOS (ABSOLUTE): 0 10*3/uL (ref 0.0–0.4)
Eos: 1 %
Hematocrit: 38 % (ref 34.0–46.6)
Hemoglobin: 12.4 g/dL (ref 11.1–15.9)
Immature Grans (Abs): 0 10*3/uL (ref 0.0–0.1)
Immature Granulocytes: 0 %
Lymphocytes Absolute: 1.7 10*3/uL (ref 0.7–3.1)
Lymphs: 60 %
MCH: 29.5 pg (ref 26.6–33.0)
MCHC: 32.6 g/dL (ref 31.5–35.7)
MCV: 90 fL (ref 79–97)
Monocytes Absolute: 0.2 10*3/uL (ref 0.1–0.9)
Monocytes: 7 %
Neutrophils Absolute: 0.9 10*3/uL — ABNORMAL LOW (ref 1.4–7.0)
Neutrophils: 31 %
Platelets: 128 10*3/uL — ABNORMAL LOW (ref 150–450)
RBC: 4.21 x10E6/uL (ref 3.77–5.28)
RDW: 12.2 % (ref 11.7–15.4)
WBC: 2.9 10*3/uL — ABNORMAL LOW (ref 3.4–10.8)

## 2024-05-05 LAB — URIC ACID: Uric Acid: 3.5 mg/dL (ref 2.6–6.2)

## 2024-05-05 LAB — C-REACTIVE PROTEIN: CRP: <1 mg/L (ref 0–10)

## 2024-05-05 LAB — ANTI-CCP AB, IGG + IGA (RDL)

## 2024-05-05 LAB — HLA-B27 ANTIGEN

## 2024-05-05 LAB — RHEUMATOID FACTOR: Rheumatoid fact SerPl-aCnc: <10 [IU]/mL (ref <14.0)

## 2024-05-05 LAB — SEDIMENTATION RATE: Sed Rate: 2 mm/h (ref 0–32)

## 2024-05-09 ENCOUNTER — Encounter: Payer: Self-pay | Admitting: Podiatry

## 2024-05-10 ENCOUNTER — Ambulatory Visit: Admitting: Podiatry

## 2024-05-18 ENCOUNTER — Other Ambulatory Visit: Payer: Self-pay

## 2024-05-18 ENCOUNTER — Ambulatory Visit: Attending: Podiatry

## 2024-05-18 DIAGNOSIS — M6281 Muscle weakness (generalized): Secondary | ICD-10-CM | POA: Insufficient documentation

## 2024-05-18 DIAGNOSIS — M76821 Posterior tibial tendinitis, right leg: Secondary | ICD-10-CM | POA: Diagnosis not present

## 2024-05-18 DIAGNOSIS — M2142 Flat foot [pes planus] (acquired), left foot: Secondary | ICD-10-CM | POA: Diagnosis not present

## 2024-05-18 DIAGNOSIS — R2689 Other abnormalities of gait and mobility: Secondary | ICD-10-CM | POA: Diagnosis present

## 2024-05-18 DIAGNOSIS — M2141 Flat foot [pes planus] (acquired), right foot: Secondary | ICD-10-CM | POA: Diagnosis not present

## 2024-05-18 DIAGNOSIS — M62461 Contracture of muscle, right lower leg: Secondary | ICD-10-CM | POA: Insufficient documentation

## 2024-05-18 DIAGNOSIS — M76822 Posterior tibial tendinitis, left leg: Secondary | ICD-10-CM | POA: Insufficient documentation

## 2024-05-18 DIAGNOSIS — M25571 Pain in right ankle and joints of right foot: Secondary | ICD-10-CM | POA: Insufficient documentation

## 2024-05-18 NOTE — Therapy (Signed)
 OUTPATIENT PHYSICAL THERAPY LOWER EXTREMITY EVALUATION   Patient Name: Barbara Rice MRN: [id number] DOB:1996-04-21, 28 y.o., female Today's Date: 05/18/2024  END OF SESSION:  PT End of Session - 05/18/24 1059     Visit Number 1    Number of Visits 9    Date for PT Re-Evaluation 07/13/24    Authorization Type [employer]    PT Start Time 1015    PT Stop Time 1052    PT Time Calculation (min) 37 min    Activity Tolerance Patient tolerated treatment well    Behavior During Therapy WFL for tasks assessed/performed             Past Medical History:  Diagnosis Date   Anemia    Sciatic nerve pain    No past surgical history on file. Patient Active Problem List   Diagnosis Date Noted   Iron  deficiency anemia 03/11/2024   H/O LEEP 08/12/2022   Seizure-like activity (HCC) 07/25/2022   Normocytic anemia 07/25/2022   Loss of consciousness (HCC) 07/24/2022   LGSIL on Pap smear of cervix 04/30/2022   Inguinal lymphadenopathy 01/26/2022    PCP: Annabell Key, [place]  E, [place]  REFERRING PROVIDER: Floyce Hutching, DPM  REFERRING DIAG:  [phone] (ICD-10-CM) - Pes planus of both feet M76.821,M76.822 (ICD-10-CM) - Posterior tibial tendon dysfunction (PTTD) of both lower extremities M62.461 (ICD-10-CM) - Gastrocnemius equinus of right lower extremity  THERAPY DIAG:  Pain in right ankle and joints of right foot  Muscle weakness (generalized)  Other abnormalities of gait and mobility  Rationale for Evaluation and Treatment: Rehabilitation  ONSET DATE: Chronic  SUBJECTIVE:   SUBJECTIVE STATEMENT: Pt presents to PT with reports of chronic bilateral foot/ankle pain, R>L. Denies trauma or MOI, pain has gradually been getting worse. Sharp pain in heel and medial foot, notes that she has very occasional tingling in bilateral feet if she flexes her toes. 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 Feels like she can only walk 5 minutes right now until pain  gets too extreme.   PERTINENT HISTORY: None  PAIN:  Are you having pain?  Yes: NPRS scale: 7/10 Worst: 10/10 Pain location: bilateral medial foot, heel, plantar fasica R>L Pain description: sharp Aggravating factors: walking, standing Relieving factors: mediation  PRECAUTIONS: None  RED FLAGS: None   WEIGHT BEARING RESTRICTIONS: No  FALLS:  Has patient fallen in last 6 months? 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  DIAGNOSTIC FINDINGS: See imaging   PATIENT SURVEYS:  LEFS: 34/80  COGNITION: Overall cognitive status: Within functional limits for tasks assessed    SENSATION: WFL  POSTURE: slight edema noted R distal LE  PALPATION: TTP to R gastroc and posterior tibialis   LOWER EXTREMITY ROM:  Active ROM Right eval Left eval  Hip flexion    Hip extension    Hip abduction    Hip adduction    Hip internal rotation    Hip external rotation    Knee flexion    Knee extension    Ankle dorsiflexion 5 12  Ankle plantarflexion    Ankle inversion    Ankle eversion     (Blank  rows = not tested)  LOWER EXTREMITY MMT:  MMT Right eval Left eval  Hip flexion    Hip extension    Hip abduction    Hip adduction    Hip internal rotation    Hip external rotation    Knee flexion    Knee extension    Ankle dorsiflexion 5 5  Ankle plantarflexion 5 5  Ankle inversion 4 p! 5  Ankle eversion 5 5   (Blank rows = not tested)  LOWER EXTREMITY SPECIAL TESTS:  Ankle special tests: Anterior drawer test: negative and Tinel's test-Posterior tibialis: positive   FUNCTIONAL TESTS:  SLS: 30 seconds  each  GAIT: Distance walked: 70ft Assistive device utilized: None Level of assistance: Complete Independence Comments: increased pronation bilaterally    TREATMENT: [facility] Adult PT Treatment:                                                DATE: 05/18/2024 Therapeutic Exercise: Long sitting calf stretch with towel x 30" R Long sitting ankle inv/ev x 10 RTB Towel scrunch x 60" Seated calf raise with ball x 10 Tandem stance x 30" Each  PATIENT EDUCATION:  Education details: eval findings, LEFS, HEP, POC Person educated: Patient Education method: Explanation, Demonstration, and Handouts Education comprehension: verbalized understanding and returned demonstration  HOME EXERCISE PROGRAM: Access Code: [email or web address] URL: [email or web address] Date: 05/18/2024 Prepared by: Loral Roch  Exercises - Long Sitting Calf Stretch with Strap  - 1 x daily - 7 x weekly - 2 reps - 30 sec hold - Ankle Inversion with Resistance  - 1 x daily - 7 x weekly - 2 sets - 10 reps - red band hold - Ankle Eversion with Resistance  - 1 x daily - 7 x weekly - 2 sets - 10 reps - red band hold - Towel Scrunches  - 1 x daily - 7 x weekly - 2 reps - 30 sec hold - Seated Calf Raise With Small Ball at Heels  - 1 x daily - 7 x weekly - 2-3 sets - 15 reps - Seated Plantar Fascia Mobilization with Small Ball  - 1 x daily - 7 x weekly - 2-3 min hold - Tandem Stance  - 1 x daily - 7 x weekly - 2 reps - 30 sec hold  ASSESSMENT:  CLINICAL IMPRESSION: Patient is a 28 y.o. F who was seen today for physical therapy evaluation and treatment for bilateral foot/ankle pain. Physical findings are consistent with referring provider impression as pt demonstrates decrease in bilateral ankle pain, decreased strength/ROM, and general decrease in functional mobility. LEFS score shows she is operating below PLOF for home ADLs and higher level activity. Pt would benefit from skilled PT services working on improving ankle/foot  strength and stability in order to decrease pain.   OBJECTIVE IMPAIRMENTS: Abnormal gait, decreased activity tolerance, decreased balance, decreased mobility, difficulty walking, decreased ROM, decreased strength, and pain  ACTIVITY LIMITATIONS: carrying, lifting, standing, squatting, stairs, and locomotion level  PARTICIPATION LIMITATIONS: driving, shopping, community activity, occupation, yard work, and school  PERSONAL FACTORS: Time since onset of injury/illness/exacerbation are also affecting patient's functional outcome.   REHAB POTENTIAL: Excellent  CLINICAL DECISION MAKING: Stable/uncomplicated  EVALUATION COMPLEXITY: Low   GOALS: Goals reviewed with patient? No  SHORT TERM GOALS: Target date: 06/08/2024   Pt will be compliant and knowledgeable with initial  HEP for improved comfort and carryover Baseline: initial HEP given  Goal status: INITIAL  2.  Pt will self report bilateral foot/ankle pain no greater than 6/10 for improved comfort and functional ability Baseline: 10/10 at worst Goal status: INITIAL   LONG TERM GOALS: Target date: 07/13/2024   Pt will improve LEFS to no less than 45/80 as proxy for functional improvement with home ADLs and higher level community activity Baseline: 34/80 Goal status: INITIAL   2.  Pt will self report bilateral ankle/foot pain no greater than 3/10 for improved comfort and functional ability Baseline: 10/10 at worst Goal status: INITIAL   3.  Pt will improve R ankle DF to no less than 12 degrees for improved comfort and functional mobility with walking Baseline: 5 degrees Goal status: INITIAL  4.  Pt will improve R ankle inversion MMT to at least 5/5 for improved stability and decrease pain Baseline:  Goal status: INITIAL   PLAN:  PT FREQUENCY: 1x/week  PT DURATION: 8 weeks  PLANNED INTERVENTIONS: 97164- PT Re-evaluation, 97110-Therapeutic exercises, 97530- Therapeutic activity, [id number]- Neuromuscular re-education, 97535-  Self Care, 96295- Manual therapy, [id number]- Gait training, M8413- Electrical stimulation (unattended), [id number]- Electrical stimulation (manual), 97016- Vasopneumatic device, Dry Needling, Cryotherapy, and Moist heat  PLAN FOR NEXT SESSION: assess HEP response, progress foot intrinsics and ankle strengthening, calf stretching   Ivor Mars, PT 05/18/2024, 2:43 PM

## 2024-06-03 ENCOUNTER — Ambulatory Visit: Admitting: Physical Therapy

## 2024-06-09 ENCOUNTER — Ambulatory Visit: Admitting: Podiatry

## 2024-06-09 ENCOUNTER — Encounter: Payer: Self-pay | Admitting: Podiatry

## 2024-06-09 DIAGNOSIS — M76821 Posterior tibial tendinitis, right leg: Secondary | ICD-10-CM | POA: Diagnosis not present

## 2024-06-09 DIAGNOSIS — M2142 Flat foot [pes planus] (acquired), left foot: Secondary | ICD-10-CM | POA: Diagnosis not present

## 2024-06-09 DIAGNOSIS — M2141 Flat foot [pes planus] (acquired), right foot: Secondary | ICD-10-CM

## 2024-06-09 DIAGNOSIS — M76822 Posterior tibial tendinitis, left leg: Secondary | ICD-10-CM | POA: Diagnosis not present

## 2024-06-09 NOTE — Progress Notes (Signed)
  Subjective:  Patient ID: Barbara Rice, female    DOB: Jul 25, 1996,  MRN: [id number]  Chief Complaint  Patient presents with   Tendonitis    It's better than the last time I was here.  My heel is still sensitive.  I know I have flat feet but can you develop it back?  Can I get shoe inserts if my insurance covers it?    Discussed the use of AI scribe software for clinical note transcription with the patient, who gave verbal consent to proceed.  History of Present Illness Barbara Rice is a 28 year old female who presents with right ankle pain and swelling.  She has had 1 session of physical therapy and has a few more scheduled she notes improvement still take the meloxicam  some      Objective:    Physical Exam VASCULAR: DP and PT pulse palpable. Foot is warm and well-perfused. Capillary fill time is brisk. DERMATOLOGIC: Normal skin turgor, texture, and temperature. No open lesions, rashes, or ulcerations. NEUROLOGIC: Normal sensation to light touch and pressure. No paresthesias. ORTHOPEDIC: Significant pes planus deformity.  She has no tenderness on the PT tendon and at its insertion.  There is no tenderness on anterior ankle joint line with edema and dorsal range of motion.  There is no pain on the sinus tarsi and pain in the plantar and posterior heel. No ecchymosis or bruising.    No images are attached to the encounter.    Results Radiographs of the right foot and ankle today show pes planovalgus deformity with collapse of the midfoot with lowering of the arch decreased Calc inclination and increased talar inclination with talar head uncovering, no abnormality in ankle joint mortise is in normal alignment no OCD noted    Assessment:   1. Pes planus of both feet   2. Posterior tibial tendon dysfunction (PTTD) of both lower extremities      Plan:  Patient was evaluated and treated and all questions answered.  Assessment and Plan Assessment & Plan Pes planus  (flat feet) Continue physical therapy until completed I expect she will need 4 to 6 weeks of therapy.  Use the meloxicam  as needed.  We discussed long-term support and treatment she does not need a Tri-Lock ankle brace anymore but her power step insoles she will continue to utilize.  I discussed with her custom molded orthoses for a good long-term option as well and provided her the CPT code L3020 to check with her insurance to see if they cover these and if so we will have her scheduled for molding and casting for these.  We also discussed appropriate shoe gear and for an open or dress shoe I recommended Vionic shoes.  Return as needed if worsening or not improving within 2 months.  Long-term we also discussed flatfoot reconstruction if this continues to be a persistent or recurrent issue for her      Return if symptoms worsen or fail to improve.

## 2024-06-09 NOTE — Patient Instructions (Signed)
 V4098 is the code we used to bill custom molded orthotics, call your insurance to see if this is something they would cover for you

## 2024-06-10 ENCOUNTER — Encounter: Payer: Self-pay | Admitting: Physical Therapy

## 2024-06-10 ENCOUNTER — Ambulatory Visit: Attending: Podiatry | Admitting: Physical Therapy

## 2024-06-10 DIAGNOSIS — M6281 Muscle weakness (generalized): Secondary | ICD-10-CM | POA: Diagnosis present

## 2024-06-10 DIAGNOSIS — M25571 Pain in right ankle and joints of right foot: Secondary | ICD-10-CM | POA: Diagnosis present

## 2024-06-10 DIAGNOSIS — R2689 Other abnormalities of gait and mobility: Secondary | ICD-10-CM | POA: Diagnosis present

## 2024-06-10 NOTE — Therapy (Signed)
 OUTPATIENT PHYSICAL THERAPY LOWER EXTREMITY TREATMENT   Patient Name: Barbara Rice MRN: [id number] DOB:[birth year], 28 y.o., female Today's Date: 06/10/2024  END OF SESSION:  PT End of Session - 06/10/24 0950     Visit Number 2    Number of Visits 9    Date for PT Re-Evaluation 07/13/24    Authorization Type [employer]    PT Start Time 0940   pt late   PT Stop Time 1018    PT Time Calculation (min) 38 min          Past Medical History:  Diagnosis Date   Anemia    Sciatic nerve pain    History reviewed. No pertinent surgical history. Patient Active Problem List   Diagnosis Date Noted   Iron  deficiency anemia 03/11/2024   H/O LEEP 08/12/2022   Seizure-like activity (HCC) 07/25/2022   Normocytic anemia 07/25/2022   Loss of consciousness (HCC) 07/24/2022   LGSIL on Pap smear of cervix 04/30/2022   Inguinal lymphadenopathy 01/26/2022    PCP: Annabell Key, [place]  E, PA  REFERRING PROVIDER: Floyce Hutching, DPM  REFERRING DIAG:  [phone] (ICD-10-CM) - Pes planus of both feet M76.821,M76.822 (ICD-10-CM) - Posterior tibial tendon dysfunction (PTTD) of both lower extremities M62.461 (ICD-10-CM) - Gastrocnemius equinus of right lower extremity  THERAPY DIAG:  Pain in right ankle and joints of right foot  Muscle weakness (generalized)  Rationale for Evaluation and Treatment: Rehabilitation  ONSET DATE: Chronic  SUBJECTIVE:   SUBJECTIVE STATEMENT: 4-5 heel pain bilateral today R>L. Saw MD who recommended continued PT and orthotics.   Pt presents to PT with reports of chronic bilateral foot/ankle pain, R>L. Denies trauma or MOI, pain has gradually been getting worse. Sharp pain in heel and medial foot, notes that she has very occasional tingling in bilateral feet if she flexes her toes. 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 Feels like she can only walk 5 minutes right now until pain gets too extreme.   PERTINENT HISTORY: None  PAIN:   Are you having pain?  Yes: NPRS scale: 4-5/10 Worst: 10/10 Pain location: bilateral medial foot, heel, plantar fasica R>L Pain description: sharp Aggravating factors: walking, standing Relieving factors: mediation  PRECAUTIONS: None  RED FLAGS: None   WEIGHT BEARING RESTRICTIONS: No  FALLS:  Has patient fallen in last 6 months? 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  DIAGNOSTIC FINDINGS: See imaging   PATIENT SURVEYS:  LEFS: 34/80  COGNITION: Overall cognitive status: Within functional limits for tasks assessed    SENSATION: WFL  POSTURE: slight edema noted R distal LE  PALPATION: TTP to R gastroc and posterior tibialis   LOWER EXTREMITY ROM:  Active ROM Right eval Left eval Right 06/10/24 Left 06/10/24  Hip flexion      Hip extension      Hip abduction      Hip adduction      Hip internal rotation      Hip external rotation      Knee flexion      Knee extension      Ankle dorsiflexion 5 12 15  13   Ankle plantarflexion  Ankle inversion      Ankle eversion       (Blank rows = not tested)  LOWER EXTREMITY MMT:  MMT Right eval Left eval Right  06/10/24  Hip flexion     Hip extension     Hip abduction     Hip adduction     Hip internal rotation     Hip external rotation     Knee flexion     Knee extension     Ankle dorsiflexion 5 5   Ankle plantarflexion 5 5   Ankle inversion 4 p! 5 4+ no pain  Ankle eversion 5 5    (Blank rows = not tested)  LOWER EXTREMITY SPECIAL TESTS:  Ankle special tests: Anterior drawer test: negative and Tinel's test-Posterior tibialis: positive   FUNCTIONAL  TESTS:  SLS: 30 seconds each  GAIT: Distance walked: 36ft Assistive device utilized: None Level of assistance: Complete Independence Comments: increased pronation bilaterally    TREATMENT: [facility] Adult PT Treatment:                                                DATE: 06/10/24 Therapeutic Exercise: Seated calf stretch with towel bilat Seated PF stretch with towel bilat   Neuromuscular re-ed: Tandem stance with head turns  SLS Therapeutic Activity: Toe Yoga Toe scrunches Seated AROM bilateral ankle circles Seated heel /toe raises Standing heel raise 10 x 2  Standing toe raise 10 x 2     [facility] Adult PT Treatment:                                                DATE: 05/18/2024 Therapeutic Exercise: Long sitting calf stretch with towel x 30 R Long sitting ankle inv/ev x 10 RTB Towel scrunch x 60 Seated calf raise with ball x 10 Tandem stance x 30 Each  PATIENT EDUCATION:  Education details: eval findings, LEFS, HEP, POC Person educated: Patient Education method: Explanation, Demonstration, and Handouts Education comprehension: verbalized understanding and returned demonstration  HOME EXERCISE PROGRAM: Access Code: [email or web address] URL: [email or web address] Date: 05/18/2024 Prepared by: Loral Roch  Exercises - Long Sitting Calf Stretch with Strap  - 1 x daily - 7 x weekly - 2 reps - 30 sec hold - Ankle Inversion with Resistance  - 1 x daily - 7 x weekly - 2 sets - 10 reps - red band hold - Ankle Eversion with Resistance  - 1 x daily - 7 x weekly - 2 sets - 10 reps - red band hold - Towel Scrunches  - 1 x daily - 7 x weekly - 2 reps - 30 sec hold - Seated Calf Raise With Small Ball at Heels  - 1 x daily - 7 x weekly - 2-3 sets - 15 reps - Seated Plantar Fascia Mobilization with Small Ball  - 1 x daily - 7 x weekly - 2-3 min hold - Tandem Stance  - 1 x daily - 7 x weekly - 2 reps - 30 sec hold  ASSESSMENT:  CLINICAL IMPRESSION: Patient is a 28 y.o. F who  was seen today for physical therapy evaluation and treatment for bilateral foot/ankle pain. Physical findings are consistent with referring provider impression as pt demonstrates decrease  in bilateral ankle pain, decreased strength/ROM, and general decrease in functional mobility. LEFS score shows she is operating below PLOF for home ADLs and higher level activity. Pt would benefit from skilled PT services working on improving ankle/foot strength and stability in order to decrease pain.   OBJECTIVE IMPAIRMENTS: Abnormal gait, decreased activity tolerance, decreased balance, decreased mobility, difficulty walking, decreased ROM, decreased strength, and pain  ACTIVITY LIMITATIONS: carrying, lifting, standing, squatting, stairs, and locomotion level  PARTICIPATION LIMITATIONS: driving, shopping, community activity, occupation, yard work, and school  PERSONAL FACTORS: Time since onset of injury/illness/exacerbation are also affecting patient's functional outcome.   REHAB POTENTIAL: Excellent  CLINICAL DECISION MAKING: Stable/uncomplicated  EVALUATION COMPLEXITY: Low   GOALS: Goals reviewed with patient? No  SHORT TERM GOALS: Target date: 06/08/2024   Pt will be compliant and knowledgeable with initial HEP for improved comfort and carryover Baseline: initial HEP given  Goal status: INITIAL  2.  Pt will self report bilateral foot/ankle pain no greater than 6/10 for improved comfort and functional ability Baseline: 10/10 at worst Goal status: INITIAL   LONG TERM GOALS: Target date: 07/13/2024   Pt will improve LEFS to no less than 45/80 as proxy for functional improvement with home ADLs and higher level community activity Baseline: 34/80 Goal status: INITIAL   2.  Pt will self report bilateral ankle/foot pain no greater than 3/10 for improved comfort and functional ability Baseline: 10/10 at worst Goal status: INITIAL   3.  Pt will improve R ankle DF to no less than 12 degrees for  improved comfort and functional mobility with walking Baseline: 5 degrees Goal status: INITIAL  4.  Pt will improve R ankle inversion MMT to at least 5/5 for improved stability and decrease pain Baseline:  Goal status: INITIAL   PLAN:  PT FREQUENCY: 1x/week  PT DURATION: 8 weeks  PLANNED INTERVENTIONS: 97164- PT Re-evaluation, 97110-Therapeutic exercises, 97530- Therapeutic activity, [id number]- Neuromuscular re-education, 97535- Self Care, [id number]- Manual therapy, [id number]- Gait training, [id number]- Electrical stimulation (unattended), [id number]- Electrical stimulation (manual), 97016- Vasopneumatic device, Dry Needling, Cryotherapy, and Moist heat  PLAN FOR NEXT SESSION: assess HEP response, progress foot intrinsics and ankle strengthening, calf stretching   Chares Commons, PTA 06/10/2024, 12:03 PM

## 2024-06-14 ENCOUNTER — Ambulatory Visit

## 2024-06-14 VITALS — BP 120/83 | HR 101 | Wt 160.0 lb

## 2024-06-14 DIAGNOSIS — Z3201 Encounter for pregnancy test, result positive: Secondary | ICD-10-CM | POA: Diagnosis not present

## 2024-06-14 DIAGNOSIS — Z3A01 Less than 8 weeks gestation of pregnancy: Secondary | ICD-10-CM

## 2024-06-14 LAB — POCT URINE PREGNANCY: Preg Test, Ur: POSITIVE — AB

## 2024-06-14 NOTE — Progress Notes (Signed)
 Barbara Rice here for a UPT. Pt had a positive upt at home. LMP is 05/12/2024.     UPT in office Positive.    Reviewed medications and informed to start a PNV, if not already. Pt to follow up in 4 weeks for New OB visit.  Tore Carreker l Caryl Manas, CMA

## 2024-06-17 ENCOUNTER — Ambulatory Visit

## 2024-06-17 DIAGNOSIS — M25571 Pain in right ankle and joints of right foot: Secondary | ICD-10-CM | POA: Diagnosis not present

## 2024-06-17 DIAGNOSIS — R2689 Other abnormalities of gait and mobility: Secondary | ICD-10-CM

## 2024-06-17 DIAGNOSIS — M6281 Muscle weakness (generalized): Secondary | ICD-10-CM | POA: Diagnosis not present

## 2024-06-17 NOTE — Therapy (Signed)
 OUTPATIENT PHYSICAL THERAPY LOWER EXTREMITY TREATMENT   Patient Name: Aviana Shevlin MRN: [id number] DOB:03/30/1996, 28 y.o., female Today's Date: 06/17/2024  END OF SESSION:  PT End of Session - 06/17/24 0939     Visit Number 3    Number of Visits 9    Date for PT Re-Evaluation 07/13/24    Authorization Type Aet[employer]    PT Start Time [phone]    PT Stop Time 1015    PT Time Calculation (min) 38 min           Past Medical History:  Diagnosis Date   Anemia    Sciatic nerve pain    History reviewed. No pertinent surgical history. Patient Active Problem List   Diagnosis Date Noted   Iron  deficiency anemia 03/11/2024   H/O LEEP 08/12/2022   Seizure-like activity (HCC) 07/25/2022   Normocytic anemia 07/25/2022   Loss of consciousness (HCC) 07/24/2022   LGSIL on Pap smear of cervix 04/30/2022   Inguinal lymphadenopathy 01/26/2022    PCP: Annabell Key, [place]  E, PA  REFERRING PROVIDER: Floyce Hutching, DPM  REFERRING DIAG:  [phone] (ICD-10-CM) - Pes planus of both feet M76.821,M76.822 (ICD-10-CM) - Posterior tibial tendon dysfunction (PTTD) of both lower extremities M62.461 (ICD-10-CM) - Gastrocnemius equinus of right lower extremity  THERAPY DIAG:  Pain in right ankle and joints of right foot  Muscle weakness (generalized)  Other abnormalities of gait and mobility  Rationale for Evaluation and Treatment: Rehabilitation  ONSET DATE: Chronic  SUBJECTIVE:   SUBJECTIVE STATEMENT: Pt presents to PT with continued 4/10 foot pain. Has been compliant with HEP.   Pt presents to PT with reports of chronic bilateral foot/ankle pain, R>L. Denies trauma or MOI, pain has gradually been getting worse. Sharp pain in heel and medial foot, notes that she has very occasional tingling in bilateral feet if she flexes her toes. 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 Feels like she can only walk 5 minutes right now until pain gets too extreme.    PERTINENT HISTORY: None  PAIN:  Are you having pain?  Yes: NPRS scale: 4-5/10 Worst: 10/10 Pain location: bilateral medial foot, heel, plantar fasica R>L Pain description: sharp Aggravating factors: walking, standing Relieving factors: mediation  PRECAUTIONS: None  RED FLAGS: None   WEIGHT BEARING RESTRICTIONS: No  FALLS:  Has patient fallen in last 6 months? 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  DIAGNOSTIC FINDINGS: See imaging   PATIENT SURVEYS:  LEFS: 34/80  COGNITION: Overall cognitive status: Within functional limits for tasks assessed    SENSATION: WFL  POSTURE: slight edema noted R distal LE  PALPATION: TTP to R gastroc and posterior tibialis   LOWER EXTREMITY ROM:  Active ROM Right eval Left eval Right 06/10/24 Left 06/10/24  Hip flexion      Hip extension      Hip abduction      Hip adduction      Hip internal rotation      Hip external rotation      Knee flexion      Knee extension      Ankle dorsiflexion 5 12 15   13  Ankle plantarflexion      Ankle inversion      Ankle eversion       (Blank rows = not tested)  LOWER EXTREMITY MMT:  MMT Right eval Left eval Right  06/10/24  Hip flexion     Hip extension     Hip abduction     Hip adduction     Hip internal rotation     Hip external rotation     Knee flexion     Knee extension     Ankle dorsiflexion 5 5   Ankle plantarflexion 5 5   Ankle inversion 4 p! 5 4+ no pain  Ankle eversion 5 5    (Blank rows = not tested)  LOWER EXTREMITY SPECIAL TESTS:  Ankle special tests: Anterior drawer test: negative and Tinel's  test-Posterior tibialis: positive   FUNCTIONAL TESTS:  SLS: 30 seconds each  GAIT: Distance walked: 73ft Assistive device utilized: None Level of assistance: Complete Independence Comments: increased pronation bilaterally    TREATMENT: [facility] Adult PT Treatment:                                                DATE: 06/17/24 Therapeutic Exercise: Post tib taping for arch support Standing heel raise with ball 2x10 Slant board calf stretch 2x30  Leg press 2x10 45# Neuromuscular re-ed: Toe yoga - great toe ext x 10 ea Toe yoga - lateral toe ext x 10 ea Tandem stance on foam 2x30 each SLS 2x30 ea Therapeutic Activity: NuStep lvl 5 LE only x 4 min  Toe scrunches x 60 S/L hip abd 2x10  [facility] Adult PT Treatment:                                                DATE: 06/10/24 Therapeutic Exercise: Seated calf stretch with towel bilat Seated PF stretch with towel bilat  Neuromuscular re-ed: Tandem stance with head turns  SLS Therapeutic Activity: Toe Yoga Toe scrunches Seated AROM bilateral ankle circles Seated heel /toe raises Standing heel raise 10 x 2  Standing toe raise 10 x 2     [facility] Adult PT Treatment:                                                DATE: 05/18/2024 Therapeutic Exercise: Long sitting calf stretch with towel x 30 R Long sitting ankle inv/ev x 10 RTB Towel scrunch x 60 Seated calf raise with ball x 10 Tandem stance x 30 Each  PATIENT EDUCATION:  Education details: eval findings, LEFS, HEP, POC Person educated: Patient Education method: Explanation, Demonstration, and Handouts Education comprehension: verbalized understanding and returned demonstration  HOME EXERCISE PROGRAM: Access Code: [email or web address] URL: [email or web address] Date: 06/17/2024 Prepared by: Loral Roch  Exercises - Long Sitting Calf Stretch with Strap  - 1 x daily - 7 x weekly - 2 reps - 30 sec hold - Ankle Inversion with Resistance  - 1 x daily - 7 x weekly - 2  sets - 10 reps - red band hold - Ankle Eversion with Resistance  - 1 x daily - 7  x weekly - 2 sets - 10 reps - red band hold - Towel Scrunches  - 1 x daily - 7 x weekly - 2 reps - 30 sec hold - Seated Calf Raise With Small Ball at Heels  - 1 x daily - 7 x weekly - 2-3 sets - 15 reps - Seated Plantar Fascia Mobilization with Small Ball  - 1 x daily - 7 x weekly - 2-3 min hold - Tandem Stance  - 1 x daily - 7 x weekly - 2 reps - 30 sec hold - Single Leg Stance  - 1 x daily - 7 x weekly - 3 sets - 10 reps - Heel Toe Raises with Counter Support  - 1 x daily - 7 x weekly - 1-2 sets - 10 reps - Sidelying Hip Abduction  - 1 x daily - 7 x weekly - 2 sets - 10 reps  ASSESSMENT:  CLINICAL IMPRESSION: Pt able to complete prescribed exercises with no adverse effect, noted fatigue in bilateral hip. Therapy today focused on continued motor control and strengthening of bilateral foot intrinsic and ankle stabilizing muscles. Also added focus on proximal hip strength today. Responded well to taping of posterior tib for medial arch stabilization noting decreased pain in standing. HEP updated for proximal hip, will continue to progress as able.   EVAL: Patient is a 28 y.o. F who was seen today for physical therapy evaluation and treatment for bilateral foot/ankle pain. Physical findings are consistent with referring provider impression as pt demonstrates decrease in bilateral ankle pain, decreased strength/ROM, and general decrease in functional mobility. LEFS score shows she is operating below PLOF for home ADLs and higher level activity. Pt would benefit from skilled PT services working on improving ankle/foot strength and stability in order to decrease pain.   OBJECTIVE IMPAIRMENTS: Abnormal gait, decreased activity tolerance, decreased balance, decreased mobility, difficulty walking, decreased ROM, decreased strength, and pain  ACTIVITY LIMITATIONS: carrying, lifting, standing, squatting, stairs, and locomotion  level  PARTICIPATION LIMITATIONS: driving, shopping, community activity, occupation, yard work, and school  PERSONAL FACTORS: Time since onset of injury/illness/exacerbation are also affecting patient's functional outcome.   REHAB POTENTIAL: Excellent  CLINICAL DECISION MAKING: Stable/uncomplicated  EVALUATION COMPLEXITY: Low   GOALS: Goals reviewed with patient? No  SHORT TERM GOALS: Target date: 06/08/2024   Pt will be compliant and knowledgeable with initial HEP for improved comfort and carryover Baseline: initial HEP given  Goal status: INITIAL  2.  Pt will self report bilateral foot/ankle pain no greater than 6/10 for improved comfort and functional ability Baseline: 10/10 at worst Goal status: INITIAL   LONG TERM GOALS: Target date: 07/13/2024   Pt will improve LEFS to no less than 45/80 as proxy for functional improvement with home ADLs and higher level community activity Baseline: 34/80 Goal status: INITIAL   2.  Pt will self report bilateral ankle/foot pain no greater than 3/10 for improved comfort and functional ability Baseline: 10/10 at worst Goal status: INITIAL   3.  Pt will improve R ankle DF to no less than 12 degrees for improved comfort and functional mobility with walking Baseline: 5 degrees Goal status: INITIAL  4.  Pt will improve R ankle inversion MMT to at least 5/5 for improved stability and decrease pain Baseline:  Goal status: INITIAL   PLAN:  PT FREQUENCY: 1x/week  PT DURATION: 8 weeks  PLANNED INTERVENTIONS: 97164- PT Re-evaluation, 97110-Therapeutic exercises, 97530- Therapeutic activity, [id number]- Neuromuscular re-education, 97535- Self Care, 16109- Manual therapy, 60454-  Gait training, [phone]- Electrical stimulation (unattended), [id number]- Electrical stimulation (manual), [id number]- Vasopneumatic device, Dry Needling, Cryotherapy, and Moist heat  PLAN FOR NEXT SESSION: assess HEP response, progress foot intrinsics and ankle strengthening, calf  stretching   Ivor Mars, PT 06/17/2024, 11:30 AM

## 2024-06-24 ENCOUNTER — Ambulatory Visit: Admitting: Physical Therapy

## 2024-07-16 ENCOUNTER — Inpatient Hospital Stay (HOSPITAL_COMMUNITY)

## 2024-07-16 ENCOUNTER — Inpatient Hospital Stay (HOSPITAL_COMMUNITY)
Admission: AD | Admit: 2024-07-16 | Discharge: 2024-07-16 | Disposition: A | Discharge status: Home / Self Care | Attending: Obstetrics & Gynecology | Admitting: Obstetrics & Gynecology

## 2024-07-16 ENCOUNTER — Encounter (HOSPITAL_COMMUNITY): Payer: Self-pay

## 2024-07-16 DIAGNOSIS — R141 Gas pain: Secondary | ICD-10-CM

## 2024-07-16 DIAGNOSIS — O26891 Other specified pregnancy related conditions, first trimester: Secondary | ICD-10-CM

## 2024-07-16 DIAGNOSIS — Z3491 Encounter for supervision of normal pregnancy, unspecified, first trimester: Secondary | ICD-10-CM

## 2024-07-16 DIAGNOSIS — R109 Unspecified abdominal pain: Secondary | ICD-10-CM

## 2024-07-16 DIAGNOSIS — R1010 Upper abdominal pain, unspecified: Secondary | ICD-10-CM | POA: Insufficient documentation

## 2024-07-16 DIAGNOSIS — Z3A10 10 weeks gestation of pregnancy: Secondary | ICD-10-CM

## 2024-07-16 DIAGNOSIS — O26899 Other specified pregnancy related conditions, unspecified trimester: Secondary | ICD-10-CM

## 2024-07-16 DIAGNOSIS — R12 Heartburn: Secondary | ICD-10-CM | POA: Diagnosis not present

## 2024-07-16 LAB — CBC
HCT: 32.7 % — ABNORMAL LOW (ref 36.0–46.0)
Hemoglobin: 11.4 g/dL — ABNORMAL LOW (ref 12.0–15.0)
MCH: 30.2 pg (ref 26.0–34.0)
MCHC: 34.9 g/dL (ref 30.0–36.0)
MCV: 86.5 fL (ref 80.0–100.0)
Platelets: 201 K/uL (ref 150–400)
RBC: 3.78 MIL/uL — ABNORMAL LOW (ref 3.87–5.11)
RDW: 12.4 % (ref 11.5–15.5)
WBC: 4.1 K/uL (ref 4.0–10.5)
nRBC: 0 % (ref 0.0–0.2)

## 2024-07-16 LAB — URINALYSIS, ROUTINE W REFLEX MICROSCOPIC
Bilirubin Urine: NEGATIVE
Glucose, UA: NEGATIVE mg/dL
Hgb urine dipstick: NEGATIVE
Ketones, ur: NEGATIVE mg/dL
Leukocytes,Ua: NEGATIVE
Nitrite: NEGATIVE
Protein, ur: NEGATIVE mg/dL
Specific Gravity, Urine: 1.002 — ABNORMAL LOW (ref 1.005–1.030)
pH: 7 (ref 5.0–8.0)

## 2024-07-16 LAB — WET PREP, GENITAL
Clue Cells Wet Prep HPF POC: NONE SEEN
Sperm: NONE SEEN
Trich, Wet Prep: NONE SEEN
WBC, Wet Prep HPF POC: >=10 — AB (ref <10)
Yeast Wet Prep HPF POC: NONE SEEN

## 2024-07-16 LAB — HCG, QUANTITATIVE, PREGNANCY: hCG, Beta Chain, Quant, S: 142252 m[IU]/mL — ABNORMAL HIGH (ref <5)

## 2024-07-16 MED ORDER — SIMETHICONE 80 MG PO TABS: 2.0000 | ORAL_TABLET | Freq: Four times a day (QID) | ORAL | 0 refills | Status: DC | PRN

## 2024-07-16 MED ORDER — FAMOTIDINE 20 MG PO TABS: 20.0000 mg | ORAL_TABLET | Freq: Two times a day (BID) | ORAL | 0 refills | Status: AC

## 2024-07-16 MED ORDER — ACETAMINOPHEN 500 MG PO TABS
1000.0000 mg | ORAL_TABLET | Freq: Four times a day (QID) | ORAL | Status: AC | PRN
Start: 2024-07-16 — End: ?

## 2024-07-16 MED ORDER — ACETAMINOPHEN 500 MG PO TABS
1000.0000 mg | ORAL_TABLET | Freq: Once | ORAL | Status: AC
  Administered 2024-07-16: 1000 mg via ORAL
  Filled 2024-07-16: qty 2

## 2024-07-16 MED ORDER — CYCLOBENZAPRINE HCL 10 MG PO TABS: 10.0000 mg | ORAL_TABLET | Freq: Two times a day (BID) | ORAL | 0 refills | Status: AC | PRN

## 2024-07-16 MED ORDER — CYCLOBENZAPRINE HCL 5 MG PO TABS
10.0000 mg | ORAL_TABLET | Freq: Once | ORAL | Status: AC
  Administered 2024-07-16: 10 mg via ORAL
  Filled 2024-07-16: qty 2

## 2024-07-16 MED ORDER — SOD CITRATE-CITRIC ACID 500-334 MG/5ML PO SOLN
30.0000 mL | Freq: Once | ORAL | Status: AC
  Administered 2024-07-16: 30 mL via ORAL
  Filled 2024-07-16: qty 30

## 2024-07-16 MED ORDER — SIMETHICONE 80 MG PO CHEW
160.0000 mg | CHEWABLE_TABLET | Freq: Once | ORAL | Status: AC
  Administered 2024-07-16: 160 mg via ORAL
  Filled 2024-07-16: qty 2

## 2024-07-16 NOTE — Discharge Instructions (Signed)

## 2024-07-16 NOTE — MAU Provider Note (Signed)
 S [name] is a 28 y.o. G1P0000 patient who presents to MAU today with complaint that she is approximately 9 weeks 2 days pregnant and starting this morning she had severe upper abdominal pain.  She also was complaining of some lower abdominal cramping that started this morning around 0500.  Patient denies any vaginal bleeding leaking of fluid has not taken any medication for the pain as of yet.  Patient does not have a confirmed IUP on ultrasound but a confirmed pregnancy by UPT.  She denies any fever, chills, nausea, vomiting, and denies eating any unusual foods.  She also denies any recent sick contacts    O BP (P) 120/74 (BP Location: Right Arm)   Pulse (P) 86   Temp (P) 97.9 F (36.6 C) (Oral)   Resp (P) 17   Ht 5' 6 (1.676 m)   Wt 73.3 kg   LMP 05/12/2024   SpO2 (P) 100%   BMI 26.07 kg/m  Physical Exam Vitals and nursing note reviewed. Exam conducted with a chaperone present.  Constitutional:      General: She is not in acute distress.    Appearance: She is well-developed. She is not ill-appearing.  HENT:     Head: Normocephalic.  Cardiovascular:     Rate and Rhythm: Normal rate.  Pulmonary:     Effort: Pulmonary effort is normal.  Abdominal:     General: Bowel sounds are increased.     Palpations: Abdomen is soft.     Tenderness: There is no guarding or rebound. Negative signs include Murphy's sign.  Skin:    General: Skin is warm.  Neurological:     Mental Status: She is alert and oriented to person, place, and time.  Psychiatric:        Mood and Affect: Mood normal.        Behavior: Behavior normal.     MDM  HIGH  Abdominal pain in early pregnacy CBC: Unremarkable HCG Quant: 142,252 OB Ultrasound ( Confirms IUP ~ [redacted]w[redacted]d) Swabs: Neg, GC pending UA: Negative   Tylenol , Flexeril , Bicitra and simethicone  given for likely GERD/Gas pain    Meds ordered this encounter  Medications   acetaminophen  (TYLENOL ) tablet 1,000 mg    cyclobenzaprine  (FLEXERIL ) tablet 10 mg   simethicone  (MYLICON) chewable tablet 160 mg   sodium citrate -citric acid  (ORACIT) solution 30 mL   famotidine  (PEPCID ) 20 MG tablet    Sig: Take 1 tablet (20 mg total) by mouth 2 (two) times daily.    Dispense:  60 tablet    Refill:  0    Supervising Provider:   PRATT, TANYA S [[id number]]   cyclobenzaprine  (FLEXERIL ) 10 MG tablet    Sig: Take 1 tablet (10 mg total) by mouth 2 (two) times daily as needed for muscle spasms.    Dispense:  20 tablet    Refill:  0    Supervising Provider:   PRATT, TANYA S [[id number]]   Simethicone  80 MG TABS    Sig: Take 2 tablets (160 mg total) by mouth 4 (four) times daily as needed (gas pain).    Dispense:  30 tablet    Refill:  0    Supervising Provider:   PRATT, TANYA S [[id number]]   acetaminophen  (TYLENOL ) 500 MG tablet    Sig: Take 2 tablets (1,000 mg total) by mouth every 6 (six) hours as needed for moderate pain (pain score 4-6) or mild pain (pain score 1-3).    Supervising Provider:  FREDIRICK GLENYS RAMAN [[id number]]     Reassessed after medication given and patient reports resolve of s/s and no longer c/o pain Will send Rx's-> Pharmacy at discharge   Differential diagnosis considered for 1st trimester vaginal bleeding includes but is not limited to: ectopic pregnancy, complete spontaneous abortion, incomplete abortion, missed abortion, threatened abortion, embryonic/fetal demise, cervical insufficiency, cervical or vaginal disorder    Orders Placed This Encounter  Procedures   Wet prep, genital    Standing Status:   Standing    Number of Occurrences:   1   US  OB Comp Less 14 Wks    No TV needed    Standing Status:   Standing    Number of Occurrences:   1    Symptom/Reason for Exam:   Abdominal pain affecting pregnancy [[id number]]   CBC    Standing Status:   Standing    Number of Occurrences:   1   Urinalysis, Routine w reflex microscopic -Urine, Random    Standing Status:   Standing    Number of Occurrences:   1     Specimen Source:   Urine, Random [[id number]]   hCG, quantitative, pregnancy    Standing Status:   Standing    Number of Occurrences:   1   Discharge patient Discharge disposition: 01-Home or Self Care; Discharge patient date: 07/16/2024    Standing Status:   Standing    Number of Occurrences:   1    Discharge disposition:   01-Home or Self Care [1]    Discharge patient date:   07/16/2024    Results for orders placed or performed during the hospital encounter of 07/16/24 (from the past 24 hours)  CBC     Status: Abnormal   Collection Time: 07/16/24  3:09 PM  Result Value Ref Range   WBC 4.1 4.0 - 10.5 K/uL   RBC 3.78 (L) 3.87 - 5.11 MIL/uL   Hemoglobin 11.4 (L) 12.0 - 15.0 g/dL   HCT 67.2 (L) 63.9 - 53.9 %   MCV 86.5 80.0 - 100.0 fL   MCH 30.2 26.0 - 34.0 pg   MCHC 34.9 30.0 - 36.0 g/dL   RDW 87.5 88.4 - 84.4 %   Platelets 201 150 - 400 K/uL   nRBC 0.0 0.0 - 0.2 %  hCG, quantitative, pregnancy     Status: Abnormal   Collection Time: 07/16/24  3:09 PM  Result Value Ref Range   hCG, Beta Chain, Quant, S 142,252 (H) <5 mIU/mL  Urinalysis, Routine w reflex microscopic -Urine, Random     Status: Abnormal   Collection Time: 07/16/24  3:13 PM  Result Value Ref Range   Color, Urine STRAW (A) YELLOW   APPearance CLEAR CLEAR   Specific Gravity, Urine 1.002 (L) 1.005 - 1.030   pH 7.0 5.0 - 8.0   Glucose, UA NEGATIVE NEGATIVE mg/dL   Hgb urine dipstick NEGATIVE NEGATIVE   Bilirubin Urine NEGATIVE NEGATIVE   Ketones, ur NEGATIVE NEGATIVE mg/dL   Protein, ur NEGATIVE NEGATIVE mg/dL   Nitrite NEGATIVE NEGATIVE   Leukocytes,Ua NEGATIVE NEGATIVE  Wet prep, genital     Status: Abnormal   Collection Time: 07/16/24  3:14 PM   Specimen: Urine, Random  Result Value Ref Range   Yeast Wet Prep HPF POC NONE SEEN NONE SEEN   Trich, Wet Prep NONE SEEN NONE SEEN   Clue Cells Wet Prep HPF POC NONE SEEN NONE SEEN   WBC, Wet Prep HPF POC >=10 (A) <10  Sperm NONE SEEN      Study Result  Narrative &  Impression  CLINICAL DATA:  Pain.  Pregnant   EXAM: OBSTETRIC <14 WK ULTRASOUND   TECHNIQUE: Transabdominal ultrasound was performed for evaluation of the gestation as well as the maternal uterus and adnexal regions.   COMPARISON:  None Available.   FINDINGS: Intrauterine gestational sac: Single   Yolk sac:  Visualized.   Embryo:  Visualized.   Cardiac Activity: Visualized.   Heart Rate: 170 bpm   CRL:   33.3 mm   10 w 1 d                  US  EDC: 02/10/2025   Subchorionic hemorrhage:  None visualized.   Maternal uterus/adnexae: Maternal right ovary measures 2.4 x 2.1 x 2.2 cm and left 2.9 x 3.7 x 2.4 cm. Follicles are seen to each ovary. No significant free fluid in the dependent maternal pelvis.   IMPRESSION: Single live intrauterine pregnancy of 10 weeks and 1 day by crown-rump length. Positive fetal heart motion of 170 beats per minute.     Electronically Signed   By: Ranell Bring M.D.   On: 07/16/2024 16:19      I have reviewed the patient chart and performed the physical exam . I have ordered & interpreted the lab results and reviewed and interpreted the ultrasound images and agree with radiology report  Medications ordered as stated above .  A/P as described below.  Counseling and education provided and patient agreeable  with plan as described below. Verbalized understanding.    ASSESSMENT Medical screening exam complete  1. Abdominal pain affecting pregnancy (Primary)  2. Normal intrauterine pregnancy on prenatal ultrasound in first trimester  3. Heartburn during pregnancy in first trimester  4. Abdominal gas pain     PLAN Future Appointments  Date Time Provider Department Center  07/19/2024 10:15 AM [facility] INTAKE [facility] None  09/06/2024 10:00 AM [facility] None  09/06/2024 10:20 AM Federico Norleen ONEIDA MADISON, MD [facility] None    Discharge from MAU in stable condition  Follow up as scheduled above  See AVS for full description of  educational information and instructions provided to the patient at time of discharge  Warning signs for worsening condition that would warrant emergency follow-up discussed Patient may return to MAU as needed   Littie Olam LABOR, NP 07/16/2024 4:42 PM

## 2024-07-16 NOTE — MAU Note (Signed)
 Dayrin Stallone is a 28 y.o. at [redacted]w[redacted]d here in MAU reporting: that she has abdominal pain in upper abdomen underneath the breast bone and it is a sharp pain that radiates down the abdomen on the left side.  She currently has cramps in the lower abdomen.  It started this morning around 0500.  Pt denies VB.  Pt states that she has not taken anything for the pain but has try to utilize a heating pad.    LMP: 05/12/24  Onset of complaint: this morning around 0500 Pain score: 9 Vitals:   07/16/24 1435  BP: 124/81  Pulse: 94  Resp: 17  Temp: 99.1 F (37.3 C)  SpO2: 100%     FHT:  not obtain due to 9w 2d today  Lab orders placed from triage: none

## 2024-07-18 LAB — GC/CHLAMYDIA PROBE AMP (~~LOC~~) NOT AT ARMC
Chlamydia: NEGATIVE
Comment: NEGATIVE
Comment: NORMAL
Neisseria Gonorrhea: NEGATIVE

## 2024-07-19 ENCOUNTER — Other Ambulatory Visit (INDEPENDENT_AMBULATORY_CARE_PROVIDER_SITE_OTHER): Payer: Self-pay

## 2024-07-19 ENCOUNTER — Ambulatory Visit (INDEPENDENT_AMBULATORY_CARE_PROVIDER_SITE_OTHER): Admitting: *Deleted

## 2024-07-19 VITALS — BP 120/75 | HR 91 | Wt 166.6 lb

## 2024-07-19 DIAGNOSIS — Z3480 Encounter for supervision of other normal pregnancy, unspecified trimester: Secondary | ICD-10-CM | POA: Insufficient documentation

## 2024-07-19 DIAGNOSIS — Z1331 Encounter for screening for depression: Secondary | ICD-10-CM | POA: Diagnosis not present

## 2024-07-19 DIAGNOSIS — Z362 Encounter for other antenatal screening follow-up: Secondary | ICD-10-CM

## 2024-07-19 DIAGNOSIS — Z3A10 10 weeks gestation of pregnancy: Secondary | ICD-10-CM | POA: Diagnosis not present

## 2024-07-19 DIAGNOSIS — Z3401 Encounter for supervision of normal first pregnancy, first trimester: Secondary | ICD-10-CM | POA: Insufficient documentation

## 2024-07-19 DIAGNOSIS — O0992 Supervision of high risk pregnancy, unspecified, second trimester: Secondary | ICD-10-CM | POA: Insufficient documentation

## 2024-07-19 DIAGNOSIS — Z3A09 9 weeks gestation of pregnancy: Secondary | ICD-10-CM

## 2024-07-19 MED ORDER — BLOOD PRESSURE KIT DEVI: 1.0000 | 0 refills | Status: AC

## 2024-07-19 MED ORDER — PRENATE PIXIE 10-0.6-0.4-200 MG PO CAPS
1.0000 | ORAL_CAPSULE | Freq: Every day | ORAL | 11 refills | Status: DC
Start: 2024-07-19 — End: 2024-09-28

## 2024-07-19 NOTE — Progress Notes (Signed)
 New OB Intake  I connected with Barbara Rice  on 07/19/24 at 10:15 AM EDT by In Person Visit and verified that I am speaking with the correct person using two identifiers. Nurse is located at [facility] and pt is located at [facility].  I discussed the limitations, risks, security and privacy concerns of performing an evaluation and management service by telephone and the availability of in person appointments. I also discussed with the patient that there may be a patient responsible charge related to this service. The patient expressed understanding and agreed to proceed.  I explained I am completing New OB Intake today. We discussed EDD of 02/16/25 based on LMP of 05/12/24. Pt is G1P0000. I reviewed her allergies, medications and Medical/Surgical/OB history.    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 Discussed the nature of our practice with multiple providers including residents and students. Due to the size of the practice, the delivering provider may not be the same as those providing prenatal care.   Patient is not interested in water birth.  MyChart/Babyscripts MyChart access verified. I explained pt will have some visits in office and some virtually. Babyscripts instructions given and order placed. Patient verifies receipt of registration text/e-mail. Account successfully created and app downloaded. If patient is a candidate for Optimized scheduling, add to sticky note.   Blood Pressure Cuff/Weight Scale Blood pressure cuff ordered for patient to pick-up from [employer]. Explained  after first prenatal appt pt will check weekly and document in Babyscripts. Patient does not have weight scale; patient may purchase if they desire to track weight weekly in Babyscripts.  Anatomy US  Explained first scheduled US  will be around 19 weeks. Anatomy US  scheduled for TBD at TBD.  Is patient a candidate for Babyscripts Optimization? Yes, patient accepted    First visit review I reviewed new OB appt with patient. Explained pt will be seen by Nidia Daring, NP at first visit. Discussed [facility] genetic screening with patient. Requests Panorama and Horizon.. Routine prenatal labs OB Urine only collected at today's visit. Initial OB labs deferred to New OB appt.   Last Pap Diagnosis  Date Value Ref Range Status  09/01/2023   Final   - Negative for intraepithelial lesion or malignancy (NILM)    Rocky CHRISTELLA Ober, RN 07/19/2024  12:14 PM

## 2024-07-19 NOTE — Patient Instructions (Signed)

## 2024-07-21 ENCOUNTER — Ambulatory Visit: Payer: Self-pay | Admitting: Obstetrics and Gynecology

## 2024-07-21 LAB — URINE CULTURE, OB REFLEX

## 2024-07-21 LAB — CULTURE, OB URINE

## 2024-08-03 ENCOUNTER — Ambulatory Visit (INDEPENDENT_AMBULATORY_CARE_PROVIDER_SITE_OTHER): Admitting: Obstetrics and Gynecology

## 2024-08-03 ENCOUNTER — Encounter: Payer: Self-pay | Admitting: Obstetrics and Gynecology

## 2024-08-03 VITALS — BP 126/75 | HR 95 | Wt 163.8 lb

## 2024-08-03 DIAGNOSIS — Z3401 Encounter for supervision of normal first pregnancy, first trimester: Secondary | ICD-10-CM

## 2024-08-03 DIAGNOSIS — O219 Vomiting of pregnancy, unspecified: Secondary | ICD-10-CM

## 2024-08-03 DIAGNOSIS — Z3A11 11 weeks gestation of pregnancy: Secondary | ICD-10-CM

## 2024-08-03 DIAGNOSIS — R569 Unspecified convulsions: Secondary | ICD-10-CM

## 2024-08-03 DIAGNOSIS — Z3480 Encounter for supervision of other normal pregnancy, unspecified trimester: Secondary | ICD-10-CM | POA: Diagnosis not present

## 2024-08-03 MED ORDER — DOXYLAMINE-PYRIDOXINE 10-10 MG PO TBEC: 2.0000 | DELAYED_RELEASE_TABLET | Freq: Every day | ORAL | 1 refills | Status: AC

## 2024-08-03 MED ORDER — ASPIRIN 81 MG PO CHEW: 81.0000 mg | CHEWABLE_TABLET | Freq: Every day | ORAL | 8 refills | Status: DC

## 2024-08-03 NOTE — Progress Notes (Addendum)
 INITIAL PRENATAL VISIT  History:  Barbara Rice is a 28 y.o. G1P0000 at [redacted]w[redacted]d by LMP being seen today for her first obstetrical visit.  Her obstetrical history is significant for none. Patient undecided intend to breast feed. Pregnancy history fully reviewed.  Patient reports nausea and vomiting.  HISTORY: OB History  Gravida Para Term Preterm AB Living  1 0 0 0 0 0  SAB IAB Ectopic Multiple Live Births  0 0 0 0 0    # Outcome Date GA Lbr Len/2nd Weight Sex Type Anes PTL Lv  1 Current             Last pap smear was done 08/2023 and was normal  Past Medical History:  Diagnosis Date   Anemia    Sciatic nerve pain    Seizure disorder during pregnancy, antepartum (HCC)    Pt reports hx of seizures since 2023. Describes falling back from sittting, jerking movements, uncontrolled tears. Denies urinary incontinence. Reports fatigue afterwards.   Past Surgical History:  Procedure Laterality Date   NO PAST SURGERIES     Family History  Problem Relation Age of Onset   Healthy Mother    Cancer Father        prostate   Hypertension Father    Cervical cancer Paternal Aunt    Ovarian cancer Paternal Aunt    Cancer Cousin    Social History   Tobacco Use   Smoking status: Never   Smokeless tobacco: Never  Vaping Use   Vaping status: Never Used  Substance Use Topics   Alcohol use: Never   Drug use: Never   Allergies  Allergen Reactions   Pork-Derived Products Other (See Comments)    Religion-based; does not consume pork-products   Current Outpatient Medications on File Prior to Visit  Medication Sig Dispense Refill   acetaminophen  (TYLENOL ) 500 MG tablet Take 2 tablets (1,000 mg total) by mouth every 6 (six) hours as needed for moderate pain (pain score 4-6) or mild pain (pain score 1-3).     Blood Pressure Monitoring (BLOOD PRESSURE KIT) DEVI 1 Device by Does not apply route once a week. 1 each 0   famotidine  (PEPCID ) 20 MG tablet Take 1 tablet (20 mg total) by mouth  2 (two) times daily. 60 tablet 0   ferrous sulfate  325 (65 FE) MG EC tablet Take 1 tablet (325 mg total) by mouth daily with breakfast. 30 tablet 6   Polyethylene Glycol 3350 (MIRALAX PO) Take by mouth.     Prenat-FeAsp-Meth-FA-DHA w/o A (PRENATE PIXIE ) 10-0.6-0.4-200 MG CAPS Take 1 tablet by mouth daily. 30 capsule 11   Simethicone  80 MG TABS Take 2 tablets (160 mg total) by mouth 4 (four) times daily as needed (gas pain). 30 tablet 0   Cholecalciferol (VITAMIN D-3 PO) Take by mouth. (Patient not taking: Reported on 08/03/2024)     cyclobenzaprine  (FLEXERIL ) 10 MG tablet Take 1 tablet (10 mg total) by mouth 2 (two) times daily as needed for muscle spasms. (Patient not taking: Reported on 07/19/2024) 20 tablet 0   hydrochlorothiazide  (MICROZIDE ) 12.5 MG capsule Take 1 capsule (12.5 mg total) by mouth daily. (Patient not taking: Reported on 06/14/2024) 30 capsule 0   hydrOXYzine  (ATARAX ) 10 MG tablet Take 1 tablet (10 mg total) by mouth 3 (three) times daily as needed. (Patient not taking: Reported on 07/19/2024) 30 tablet 0   Prenatal Vit-Fe Fumarate-FA (PRENATAL VITAMINS PO) Take by mouth. (Patient not taking: Reported on 08/03/2024)  No current facility-administered medications on file prior to visit.    Review of Systems Pertinent items noted in HPI and remainder of comprehensive ROS otherwise negative.  Indications for ASA therapy (per UpToDate) One of the following: (Needs 162 mg daily) Previous pregnancy with preeclampsia, especially early onset and with an adverse outcome No Chronic hypertension No Type 1 or 2 diabetes mellitus No Multifetal gestation No Chronic kidney disease No Autoimmune disease (antiphospholipid syndrome, systemic lupus erythematosus) No Two or more of the following: (Can do 81 mg daily) Nulliparity Yes Obesity (body mass index >30 kg/m2) No Family history of preeclampsia in mother or sister No Age >=35 years No Sociodemographic characteristics (African American  race, low socioeconomic level) Yes Personal risk factors (eg, previous pregnancy with low birth weight or small for gestational age infant, previous adverse pregnancy outcome [eg, stillbirth], interval >10 years between pregnancies) No In vitro conception No  Physical Exam:   Vitals:   08/03/24 1024  BP: 126/75  Pulse: 95  Weight: 74.3 kg   Fetal Heart Rate (bpm): 151  Viable intrauterine pregnancy with positive cardiac activity noted, fetal heart rate 151 bpm  General: well-developed, well-nourished female in no acute distress  Breasts:  normal appearance, no masses or tenderness bilaterally, exam done in the presence of a chaperone.   Skin: normal coloration and turgor, no rashes  Neurologic: oriented, normal, negative, normal mood  Extremities: normal strength, tone, and muscle mass, ROM of all joints is normal  HEENT PERRLA, extraocular movement intact and sclera clear, anicteric  Neck supple and no masses  Cardiovascular: regular rate and rhythm  Respiratory:  no respiratory distress, normal breath sounds  Abdomen: soft, non-tender; bowel sounds normal; no masses,  no organomegaly  Pelvic: Deferred   Assessment:  Pregnancy: G1P0000 Patient Active Problem List   Diagnosis Date Noted   Supervision of other normal pregnancy, antepartum 07/19/2024   Iron  deficiency anemia 03/11/2024   H/O LEEP 08/12/2022   Seizure-like activity (HCC) 07/25/2022   Normocytic anemia 07/25/2022   Loss of consciousness (HCC) 07/24/2022   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 Supervision of other normal pregnancy, antepartum (Primary) BP and FHR normal - CBC/D/Plt+RPR+Rh+ABO+RubIgG... - HgB A1c - PANORAMA PRENATAL TEST - HORIZON Basic Panel - aspirin  81 MG chewable tablet; Chew 1 tablet (81 mg total) by mouth daily.  Dispense: 30 tablet; Refill: 8  2. [redacted] weeks gestation of pregnancy Follow up ROB in 4 weeks  3. Seizure-like activity  (HCC) Reports seizure like activity with flaccidity and jerking. Admitted to ED. Negative EEG and Neg MRI. Referred to Neurology work up was normal. Discussion with Neuro- unknown cause. Last seizure like activity was in May 2025, but states that it was mild but did not black out. Last major episode in was in Sept 2024 where she did black out. Was given Atarax  for possible stress or anxiety response. No longer taking. Declines referral to Neur at this time. Close management of symptoms  4. Nausea and vomiting during pregnancy - Doxylamine -Pyridoxine  10-10 MG TBEC; Take 2 tablets by mouth at bedtime. May also take 1 tab in am and 1 tab in afternoon  Dispense: 100 tablet; Refill: 1   Recommended small frequent meals and snacks alternating every 2 hours.  Initial labs drawn. Continue prenatal vitamins. Problem list reviewed and updated. Genetic Screening discussed, Panorama and Horizon: ordered. Ultrasound discussed; fetal anatomic survey: scheduled. Anticipatory guidance about prenatal visits given including  labs, ultrasounds, and testing. Weight gain recommendations per IOM guidelines reviewed: underweight/BMI 18.5 or less > 28 - 40 lbs; normal weight/BMI 18.5 - 24.9 > 25 - 35 lbs; overweight/BMI 25 - 29.9 > 15 - 25 lbs; obese/BMI 30 or more > 11 - 20 lbs. Discussed usage of the Babyscripts app for more information about pregnancy, and to track blood pressures. Also discussed usage of virtual visits as additional source of managing and completing prenatal visits.  Patient was encouraged to use MyChart to review results, send requests, and have questions addressed.   The nature of [facility] with multiple MDs and Advanced Practice Providers was explained to patient; also emphasized that residents, students are part of our team. Routine obstetric precautions reviewed. Encouraged to seek out care at our office or emergency room [facility] MAU preferred) for urgent  and/or emergent concerns. Return in about 4 weeks (around 08/31/2024) for ROB and AFP.    Derrek JINNY Freund, NP Student

## 2024-08-03 NOTE — Progress Notes (Signed)
No concerns at this time. `

## 2024-08-05 LAB — CBC/D/PLT+RPR+RH+ABO+RUBIGG...
Antibody Screen: NEGATIVE
Basophils Absolute: 0 x10E3/uL (ref 0.0–0.2)
Basos: 1 %
EOS (ABSOLUTE): 0 x10E3/uL (ref 0.0–0.4)
Eos: 1 %
HCV Ab: NONREACTIVE
HIV Screen 4th Generation wRfx: NONREACTIVE
Hematocrit: 37.6 % (ref 34.0–46.6)
Hemoglobin: 12.4 g/dL (ref 11.1–15.9)
Hepatitis B Surface Ag: NEGATIVE
Immature Grans (Abs): 0 x10E3/uL (ref 0.0–0.1)
Immature Granulocytes: 0 %
Lymphocytes Absolute: 1.2 x10E3/uL (ref 0.7–3.1)
Lymphs: 30 %
MCH: 30.5 pg (ref 26.6–33.0)
MCHC: 33 g/dL (ref 31.5–35.7)
MCV: 93 fL (ref 79–97)
Monocytes Absolute: 0.3 x10E3/uL (ref 0.1–0.9)
Monocytes: 6 %
Neutrophils Absolute: 2.5 x10E3/uL (ref 1.4–7.0)
Neutrophils: 62 %
Platelets: 217 x10E3/uL (ref 150–450)
RBC: 4.06 x10E6/uL (ref 3.77–5.28)
RDW: 13 % (ref 11.7–15.4)
RPR Ser Ql: NONREACTIVE
Rh Factor: POSITIVE
Rubella Antibodies, IGG: 20.7 {index} (ref >0.99)
WBC: 4.1 x10E3/uL (ref 3.4–10.8)

## 2024-08-05 LAB — HEMOGLOBIN A1C
Est. average glucose Bld gHb Est-mCnc: 100 mg/dL
Hgb A1c MFr Bld: 5.1 % (ref 4.8–5.6)

## 2024-08-05 LAB — HCV INTERPRETATION

## 2024-08-07 ENCOUNTER — Ambulatory Visit: Payer: Self-pay | Admitting: Obstetrics and Gynecology

## 2024-08-07 DIAGNOSIS — Z3480 Encounter for supervision of other normal pregnancy, unspecified trimester: Secondary | ICD-10-CM

## 2024-08-09 LAB — PANORAMA PRENATAL TEST FULL PANEL:PANORAMA TEST PLUS 5 ADDITIONAL MICRODELETIONS: FETAL FRACTION: 8

## 2024-08-11 LAB — HORIZON CUSTOM: REPORT SUMMARY: NEGATIVE

## 2024-08-22 ENCOUNTER — Telehealth: Payer: Self-pay

## 2024-08-22 ENCOUNTER — Inpatient Hospital Stay (HOSPITAL_COMMUNITY)
Admission: AD | Admit: 2024-08-22 | Discharge: 2024-08-22 | Disposition: A | Discharge status: Home / Self Care | Attending: Obstetrics and Gynecology | Admitting: Obstetrics and Gynecology

## 2024-08-22 ENCOUNTER — Encounter (HOSPITAL_COMMUNITY): Payer: Self-pay | Admitting: Obstetrics and Gynecology

## 2024-08-22 ENCOUNTER — Inpatient Hospital Stay (HOSPITAL_COMMUNITY)

## 2024-08-22 DIAGNOSIS — O26892 Other specified pregnancy related conditions, second trimester: Secondary | ICD-10-CM

## 2024-08-22 DIAGNOSIS — R52 Pain, unspecified: Secondary | ICD-10-CM

## 2024-08-22 DIAGNOSIS — R7989 Other specified abnormal findings of blood chemistry: Secondary | ICD-10-CM | POA: Diagnosis not present

## 2024-08-22 DIAGNOSIS — R0602 Shortness of breath: Secondary | ICD-10-CM

## 2024-08-22 DIAGNOSIS — Z3A14 14 weeks gestation of pregnancy: Secondary | ICD-10-CM | POA: Diagnosis not present

## 2024-08-22 LAB — CBC WITH DIFFERENTIAL/PLATELET
Abs Immature Granulocytes: 0.05 K/uL (ref 0.00–0.07)
Basophils Absolute: 0 K/uL (ref 0.0–0.1)
Basophils Relative: 1 %
Eosinophils Absolute: 0 K/uL (ref 0.0–0.5)
Eosinophils Relative: 1 %
HCT: 33.8 % — ABNORMAL LOW (ref 36.0–46.0)
Hemoglobin: 11.4 g/dL — ABNORMAL LOW (ref 12.0–15.0)
Immature Granulocytes: 1 %
Lymphocytes Relative: 35 %
Lymphs Abs: 1.5 K/uL (ref 0.7–4.0)
MCH: 30.2 pg (ref 26.0–34.0)
MCHC: 33.7 g/dL (ref 30.0–36.0)
MCV: 89.7 fL (ref 80.0–100.0)
Monocytes Absolute: 0.4 K/uL (ref 0.1–1.0)
Monocytes Relative: 9 %
Neutro Abs: 2.2 K/uL (ref 1.7–7.7)
Neutrophils Relative %: 53 %
Platelets: 206 K/uL (ref 150–400)
RBC: 3.77 MIL/uL — ABNORMAL LOW (ref 3.87–5.11)
RDW: 13 % (ref 11.5–15.5)
WBC: 4.2 K/uL (ref 4.0–10.5)
nRBC: 0 % (ref 0.0–0.2)

## 2024-08-22 LAB — COMPREHENSIVE METABOLIC PANEL WITH GFR
ALT: 14 U/L (ref 0–44)
AST: 20 U/L (ref 15–41)
Albumin: 3.3 g/dL — ABNORMAL LOW (ref 3.5–5.0)
Alkaline Phosphatase: 27 U/L — ABNORMAL LOW (ref 38–126)
Anion gap: 7 (ref 5–15)
BUN: <5 mg/dL — ABNORMAL LOW (ref 6–20)
CO2: 20 mmol/L — ABNORMAL LOW (ref 22–32)
Calcium: 9.4 mg/dL (ref 8.9–10.3)
Chloride: 107 mmol/L (ref 98–111)
Creatinine, Ser: 0.52 mg/dL (ref 0.44–1.00)
GFR, Estimated: >60 mL/min (ref >60)
Glucose, Bld: 82 mg/dL (ref 70–99)
Potassium: 4 mmol/L (ref 3.5–5.1)
Sodium: 134 mmol/L — ABNORMAL LOW (ref 135–145)
Total Bilirubin: 0.7 mg/dL (ref 0.0–1.2)
Total Protein: 6.6 g/dL (ref 6.5–8.1)

## 2024-08-22 LAB — RESP PANEL BY RT-PCR (RSV, FLU A&B, COVID)  RVPGX2
Influenza A by PCR: NEGATIVE
Influenza B by PCR: NEGATIVE
Resp Syncytial Virus by PCR: NEGATIVE
SARS Coronavirus 2 by RT PCR: NEGATIVE

## 2024-08-22 LAB — D-DIMER, QUANTITATIVE: D-Dimer, Quant: 0.81 ug{FEU}/mL — ABNORMAL HIGH (ref 0.00–0.50)

## 2024-08-22 LAB — IRON AND TIBC
Iron: 53 ug/dL (ref 28–170)
Saturation Ratios: 16 % (ref 10.4–31.8)
TIBC: 335 ug/dL (ref 250–450)
UIBC: 282 ug/dL

## 2024-08-22 LAB — FERRITIN: Ferritin: 66 ng/mL (ref 11–307)

## 2024-08-22 MED ORDER — IOHEXOL 350 MG/ML SOLN
75.0000 mL | Freq: Once | INTRAVENOUS | Status: AC | PRN
  Administered 2024-08-22: 75 mL via INTRAVENOUS

## 2024-08-22 NOTE — Telephone Encounter (Signed)
 Attempted TC to pt about the pain she is having. No answer, left VM

## 2024-08-22 NOTE — MAU Provider Note (Addendum)
 Chief Complaint:  Generalized Body Aches and Shortness of Breath   HPI   Event Date/Time   First Provider Initiated Contact with Patient 08/22/24 1655      Barbara Rice is a 28 y.o. G1P0000 at [redacted]w[redacted]d who presents to maternity admissions reporting body aches and SOB. Generalized body aches started 5 days ago, no identified trigger. She tried Tylenol  and Flexeril  without improvement. She began experiencing SOB today which she has not experienced before. She states that the body aches feel similar to when she had low iron  before. Denies fever, chills, URI symptoms, nausea, vomiting.   Pregnancy Course: Receives care at [facility] for [facility] . Prenatal records reviewed. Hgb at initial OB on 08/03/2024 was 12.4.  Past Medical History:  Diagnosis Date   Anemia    Sciatic nerve pain    Seizure disorder during pregnancy, antepartum (HCC)    Pt reports hx of seizures since 2023. Describes falling back from sittting, jerking movements, uncontrolled tears. Denies urinary incontinence. Reports fatigue afterwards.   OB History  Gravida Para Term Preterm AB Living  1 0 0 0 0 0  SAB IAB Ectopic Multiple Live Births  0 0 0 0 0    # Outcome Date GA Lbr Len/2nd Weight Sex Type Anes PTL Lv  1 Current            Past Surgical History:  Procedure Laterality Date   NO PAST SURGERIES     Family History  Problem Relation Age of Onset   Healthy Mother    Cancer Father        prostate   Hypertension Father    Cervical cancer Paternal Aunt    Ovarian cancer Paternal Aunt    Cancer Cousin    Social History   Tobacco Use   Smoking status: Never   Smokeless tobacco: Never  Vaping Use   Vaping status: Never Used  Substance Use Topics   Alcohol use: Never   Drug use: Never   Allergies  Allergen Reactions   Pork-Derived Products Other (See Comments)    Religion-based; does not consume pork-products   No medications prior to admission.    I have reviewed patient's Past  Medical Hx, Surgical Hx, Family Hx, Social Hx, medications and allergies.   ROS  Pertinent items noted in HPI and remainder of comprehensive ROS otherwise negative.   PHYSICAL EXAM  Patient Vitals for the past 24 hrs:  BP Temp Temp src Pulse Resp SpO2 Height Weight  08/22/24 2330 121/88 -- -- 88 -- -- -- --  08/22/24 2300 -- -- -- -- -- 100 % -- --  08/22/24 2230 -- -- -- -- -- 100 % -- --  08/22/24 2130 -- -- -- -- -- 100 % -- --  08/22/24 2100 -- -- -- -- -- 100 % -- --  08/22/24 2030 -- -- -- -- -- 100 % -- --  08/22/24 2000 -- -- -- -- -- 99 % -- --  08/22/24 1930 -- -- -- -- -- 100 % -- --  08/22/24 1900 -- -- -- -- -- 100 % -- --  08/22/24 1800 -- -- -- -- -- 100 % -- --  08/22/24 1730 -- -- -- -- -- 100 % -- --  08/22/24 1700 -- -- -- -- -- 100 % -- --  08/22/24 1634 128/80 -- -- 100 (!) 22 100 % -- --  08/22/24 1617 132/85 98.6 F (37 C) Oral (!) 107 20 100 % 5' 7 (1.702  m) 77.1 kg  08/22/24 1616 -- -- -- -- -- 100 % -- --    Constitutional: Well-developed, well-nourished female in no acute distress.  HEENT: atraumatic, normocephalic. Neck has normal ROM. EOM intact. Cardiovascular: normal rate & rhythm, warm and well-perfused. No murmurs, rubs, or gallops. Respiratory: normal effort, no problems with respiration noted. CTA bilaterally but tachypneic. GI: Abd soft, non-tender, non-distended MSK: Extremities nontender, no edema, normal ROM Skin: warm and dry. Acyanotic, no jaundice or pallor. Neurologic: Alert and oriented x 4. No abnormal coordination. Psychiatric: Normal mood. Speech not slurred, not rapid/pressured. Patient is cooperative. GU: no CVA tenderness  Labs: Results for orders placed or performed during the hospital encounter of 08/22/24 (from the past 24 hours)  Resp panel by RT-PCR (RSV, Flu A&B, Covid) Anterior Nasal Swab     Status: None   Collection Time: 08/22/24  4:52 PM   Specimen: Anterior Nasal Swab  Result Value Ref Range   SARS Coronavirus 2  by RT PCR NEGATIVE NEGATIVE   Influenza A by PCR NEGATIVE NEGATIVE   Influenza B by PCR NEGATIVE NEGATIVE   Resp Syncytial Virus by PCR NEGATIVE NEGATIVE  CBC with Differential/Platelet     Status: Abnormal   Collection Time: 08/22/24  4:52 PM  Result Value Ref Range   WBC 4.2 4.0 - 10.5 K/uL   RBC 3.77 (L) 3.87 - 5.11 MIL/uL   Hemoglobin 11.4 (L) 12.0 - 15.0 g/dL   HCT 66.1 (L) 63.9 - 53.9 %   MCV 89.7 80.0 - 100.0 fL   MCH 30.2 26.0 - 34.0 pg   MCHC 33.7 30.0 - 36.0 g/dL   RDW 86.9 88.4 - 84.4 %   Platelets 206 150 - 400 K/uL   nRBC 0.0 0.0 - 0.2 %   Neutrophils Relative % 53 %   Neutro Abs 2.2 1.7 - 7.7 K/uL   Lymphocytes Relative 35 %   Lymphs Abs 1.5 0.7 - 4.0 K/uL   Monocytes Relative 9 %   Monocytes Absolute 0.4 0.1 - 1.0 K/uL   Eosinophils Relative 1 %   Eosinophils Absolute 0.0 0.0 - 0.5 K/uL   Basophils Relative 1 %   Basophils Absolute 0.0 0.0 - 0.1 K/uL   Immature Granulocytes 1 %   Abs Immature Granulocytes 0.05 0.00 - 0.07 K/uL  D-dimer, quantitative     Status: Abnormal   Collection Time: 08/22/24  5:49 PM  Result Value Ref Range   D-Dimer, Quant 0.81 (H) 0.00 - 0.50 ug/mL-FEU  Iron  and TIBC     Status: None   Collection Time: 08/22/24  5:49 PM  Result Value Ref Range   Iron  53 28 - 170 ug/dL   TIBC 664 749 - 549 ug/dL   Saturation Ratios 16 10.4 - 31.8 %   UIBC 282 ug/dL  Ferritin     Status: None   Collection Time: 08/22/24  5:49 PM  Result Value Ref Range   Ferritin 66 11 - 307 ng/mL  Comprehensive metabolic panel with GFR     Status: Abnormal   Collection Time: 08/22/24  5:49 PM  Result Value Ref Range   Sodium 134 (L) 135 - 145 mmol/L   Potassium 4.0 3.5 - 5.1 mmol/L   Chloride 107 98 - 111 mmol/L   CO2 20 (L) 22 - 32 mmol/L   Glucose, Bld 82 70 - 99 mg/dL   BUN <5 (L) 6 - 20 mg/dL   Creatinine, Ser 9.47 0.44 - 1.00 mg/dL   Calcium 9.4  8.9 - 10.3 mg/dL   Total Protein 6.6 6.5 - 8.1 g/dL   Albumin 3.3 (L) 3.5 - 5.0 g/dL   AST 20 15 - 41 U/L    ALT 14 0 - 44 U/L   Alkaline Phosphatase 27 (L) 38 - 126 U/L   Total Bilirubin 0.7 0.0 - 1.2 mg/dL   GFR, Estimated >39 >39 mL/min   Anion gap 7 5 - 15    Imaging:  CT Angio Chest Pulmonary Embolism (PE) W or WO Contrast Result Date: 08/22/2024 CLINICAL DATA:  Pulmonary embolism (PE) suspected, low to intermediate prob, positive D-dimer Pulmonary embolism (PE) suspected, pregnant EXAM: CT ANGIOGRAPHY CHEST WITH CONTRAST TECHNIQUE: Multidetector CT imaging of the chest was performed using the standard protocol during bolus administration of intravenous contrast. Multiplanar CT image reconstructions and MIPs were obtained to evaluate the vascular anatomy. RADIATION DOSE REDUCTION: This exam was performed according to the departmental dose-optimization program which includes automated exposure control, adjustment of the mA and/or kV according to patient size and/or use of iterative reconstruction technique. CONTRAST:  75mL OMNIPAQUE  IOHEXOL  350 MG/ML SOLN COMPARISON:  CT angio chest 07/24/2022 FINDINGS: Cardiovascular: Satisfactory opacification of the pulmonary arteries to the segmental level. No evidence of pulmonary embolism. The main pulmonary artery is normal in caliber. Normal heart size. No significant pericardial effusion. The thoracic aorta is normal in caliber. No atherosclerotic plaque of the thoracic aorta. No coronary artery calcifications. Mediastinum/Nodes: No enlarged mediastinal, hilar, or axillary lymph nodes. Thyroid gland, trachea, and esophagus demonstrate no significant findings. Lungs/Pleura: No focal consolidation. No pulmonary nodule. No pulmonary mass. No pleural effusion. No pneumothorax. Upper Abdomen: No acute abnormality. Musculoskeletal: No chest wall abnormality. No suspicious lytic or blastic osseous lesions. No acute displaced fracture. Multilevel degenerative changes of the spine. Review of the MIP images confirms the above findings. IMPRESSION: 1. No pulmonary embolus. 2.  No acute intrathoracic abnormality. Electronically Signed   By: Morgane  Naveau M.D.   On: 08/22/2024 22:39    MDM & MAU COURSE  MDM: High  MAU Course: -Tachycardic, otherwise vitals within normal limits. -CBC and iron  studies for possible anemia. -CMP for electrolytes, renal and hepatic function. Respiratory panel for flu, COVID, RSV. -D-dimer for possible PE. -D-dimer elevated (can be a normal finding in pregnancy), however will get CTPA. -CBC with decreased Hgb 11.4, normocytic anemia. May be worthwhile to see hematology given normal iron  levels. -CMP without significant abnormalities. -Respiratory panel negative. -CTPA pending. Care handed to Ilona Cedar CNM at 2000  Differential diagnosis considered for shortness of breath includes but is not limited to: physiologic SOB of pregnancy, URI, asthma, anemia, valvular heart disease, anxiety/panic disorder, pulmonary embolism    - assumed care at 2000. S. Cedar CNM  - Normal CT/PA  - Patient reports feeling better.  - Plan for discharge.  Orders Placed This Encounter  Procedures   Resp panel by RT-PCR (RSV, Flu A&B, Covid) Anterior Nasal Swab   CT Angio Chest Pulmonary Embolism (PE) W or WO Contrast   CBC with Differential/Platelet   D-dimer, quantitative   Iron  and TIBC   Ferritin   Comprehensive metabolic panel with GFR   Discharge patient Discharge disposition: 01-Home or Self Care; Discharge patient date: 08/22/2024   Meds ordered this encounter  Medications   iohexol  (OMNIPAQUE ) 350 MG/ML injection 75 mL    ASSESSMENT   1. Body aches   2. SOB (shortness of breath) on exertion   3. [redacted] weeks gestation of pregnancy  PLAN  Discharge home in stable condition with return precautions.   - Discussed option for testing too early for COVID. Patient verbalized understanding,  - Pt discharged home in stable condition and to return to MAU if symptoms worsen.    Allergies as of 08/22/2024 - Review Complete 08/22/2024   Allergen Reaction Noted   Pork-derived products Other (See Comments) 07/25/2022      Kasondra Junod (Claris) Emilio, MSN, CNM  [facility]  08/23/2024 5:04 AM

## 2024-08-22 NOTE — MAU Note (Signed)
 Barbara Rice is a 28 y.o. at [redacted]w[redacted]d here in MAU reporting: body aches since Wed. Was prescribed flexeril  about a month ago, took it and Tylenol , didn't help. Came in because she was SOB.  Denies fever. Also has hx of anemia, hasn't been tested for awhile. Having some cramping in lower abd. Denies cough or sore throat.  Denies bleeding.   Onset of complaint: last Wed Pain score: generalized 10, abd 8 Vitals:   08/22/24 1616 08/22/24 1617  BP:  132/85  Pulse:  (!) 107  Resp:  20  Temp:  98.6 F (37 C)  SpO2: 100% 100%     FHT:152 Lab orders placed from triage:

## 2024-08-22 NOTE — MAU Provider Note (Incomplete Revision)
 Chief Complaint:  Generalized Body Aches and Shortness of Breath   HPI   Event Date/Time   First Provider Initiated Contact with Patient 08/22/24 1655      Barbara Rice is a 28 y.o. G1P0000 at [redacted]w[redacted]d who presents to maternity admissions reporting body aches and SOB. Generalized body aches started 5 days ago, no identified trigger. She tried Tylenol  and Flexeril  without improvement. She began experiencing SOB today which she has not experienced before. She states that the body aches feel similar to when she had low iron  before. Denies fever, chills, URI symptoms, nausea, vomiting.   Pregnancy Course: Receives care at [facility] for [facility] . Prenatal records reviewed. Hgb at initial OB on 08/03/2024 was 12.4.  Past Medical History:  Diagnosis Date   Anemia    Sciatic nerve pain    Seizure disorder during pregnancy, antepartum (HCC)    Pt reports hx of seizures since 2023. Describes falling back from sittting, jerking movements, uncontrolled tears. Denies urinary incontinence. Reports fatigue afterwards.   OB History  Gravida Para Term Preterm AB Living  1 0 0 0 0 0  SAB IAB Ectopic Multiple Live Births  0 0 0 0 0    # Outcome Date GA Lbr Len/2nd Weight Sex Type Anes PTL Lv  1 Current            Past Surgical History:  Procedure Laterality Date   NO PAST SURGERIES     Family History  Problem Relation Age of Onset   Healthy Mother    Cancer Father        prostate   Hypertension Father    Cervical cancer Paternal Aunt    Ovarian cancer Paternal Aunt    Cancer Cousin    Social History   Tobacco Use   Smoking status: Never   Smokeless tobacco: Never  Vaping Use   Vaping status: Never Used  Substance Use Topics   Alcohol use: Never   Drug use: Never   Allergies  Allergen Reactions   Pork-Derived Products Other (See Comments)    Religion-based; does not consume pork-products   Medications Prior to Admission  Medication Sig Dispense Refill Last  Dose/Taking   ferrous sulfate  325 (65 FE) MG EC tablet Take 1 tablet (325 mg total) by mouth daily with breakfast. 30 tablet 6 08/22/2024 at 12:00 PM   Prenat-FeAsp-Meth-FA-DHA w/o A (PRENATE PIXIE ) 10-0.6-0.4-200 MG CAPS Take 1 tablet by mouth daily. 30 capsule 11 08/22/2024 at 12:00 PM   acetaminophen  (TYLENOL ) 500 MG tablet Take 2 tablets (1,000 mg total) by mouth every 6 (six) hours as needed for moderate pain (pain score 4-6) or mild pain (pain score 1-3).      aspirin  81 MG chewable tablet Chew 1 tablet (81 mg total) by mouth daily. 30 tablet 8    Blood Pressure Monitoring (BLOOD PRESSURE KIT) DEVI 1 Device by Does not apply route once a week. 1 each 0    Cholecalciferol (VITAMIN D-3 PO) Take by mouth. (Patient not taking: Reported on 08/03/2024)      cyclobenzaprine  (FLEXERIL ) 10 MG tablet Take 1 tablet (10 mg total) by mouth 2 (two) times daily as needed for muscle spasms. (Patient not taking: Reported on 07/19/2024) 20 tablet 0    Doxylamine -Pyridoxine  10-10 MG TBEC Take 2 tablets by mouth at bedtime. May also take 1 tab in am and 1 tab in afternoon 100 tablet 1    famotidine  (PEPCID ) 20 MG tablet Take 1 tablet (20 mg total)  by mouth 2 (two) times daily. 60 tablet 0    hydrochlorothiazide  (MICROZIDE ) 12.5 MG capsule Take 1 capsule (12.5 mg total) by mouth daily. (Patient not taking: Reported on 06/14/2024) 30 capsule 0    hydrOXYzine  (ATARAX ) 10 MG tablet Take 1 tablet (10 mg total) by mouth 3 (three) times daily as needed. (Patient not taking: Reported on 07/19/2024) 30 tablet 0    Polyethylene Glycol 3350 (MIRALAX PO) Take by mouth.      Prenatal Vit-Fe Fumarate-FA (PRENATAL VITAMINS PO) Take by mouth. (Patient not taking: Reported on 08/03/2024)      Simethicone  80 MG TABS Take 2 tablets (160 mg total) by mouth 4 (four) times daily as needed (gas pain). 30 tablet 0     I have reviewed patient's Past Medical Hx, Surgical Hx, Family Hx, Social Hx, medications and allergies.   ROS  Pertinent  items noted in HPI and remainder of comprehensive ROS otherwise negative.   PHYSICAL EXAM  Patient Vitals for the past 24 hrs:  BP Temp Temp src Pulse Resp SpO2 Height Weight  08/22/24 2000 -- -- -- -- -- 99 % -- --  08/22/24 1930 -- -- -- -- -- 100 % -- --  08/22/24 1900 -- -- -- -- -- 100 % -- --  08/22/24 1800 -- -- -- -- -- 100 % -- --  08/22/24 1730 -- -- -- -- -- 100 % -- --  08/22/24 1700 -- -- -- -- -- 100 % -- --  08/22/24 1634 128/80 -- -- 100 (!) 22 100 % -- --  08/22/24 1617 132/85 98.6 F (37 C) Oral (!) 107 20 100 % 5' 7 (1.702 m) 77.1 kg  08/22/24 1616 -- -- -- -- -- 100 % -- --    Constitutional: Well-developed, well-nourished female in no acute distress.  HEENT: atraumatic, normocephalic. Neck has normal ROM. EOM intact. Cardiovascular: normal rate & rhythm, warm and well-perfused. No murmurs, rubs, or gallops. Respiratory: normal effort, no problems with respiration noted. CTA bilaterally but tachypneic. GI: Abd soft, non-tender, non-distended MSK: Extremities nontender, no edema, normal ROM Skin: warm and dry. Acyanotic, no jaundice or pallor. Neurologic: Alert and oriented x 4. No abnormal coordination. Psychiatric: Normal mood. Speech not slurred, not rapid/pressured. Patient is cooperative. GU: no CVA tenderness  Labs: Results for orders placed or performed during the hospital encounter of 08/22/24 (from the past 24 hours)  Resp panel by RT-PCR (RSV, Flu A&B, Covid) Anterior Nasal Swab     Status: None   Collection Time: 08/22/24  4:52 PM   Specimen: Anterior Nasal Swab  Result Value Ref Range   SARS Coronavirus 2 by RT PCR NEGATIVE NEGATIVE   Influenza A by PCR NEGATIVE NEGATIVE   Influenza B by PCR NEGATIVE NEGATIVE   Resp Syncytial Virus by PCR NEGATIVE NEGATIVE  CBC with Differential/Platelet     Status: Abnormal   Collection Time: 08/22/24  4:52 PM  Result Value Ref Range   WBC 4.2 4.0 - 10.5 K/uL   RBC 3.77 (L) 3.87 - 5.11 MIL/uL   Hemoglobin  11.4 (L) 12.0 - 15.0 g/dL   HCT 66.1 (L) 63.9 - 53.9 %   MCV 89.7 80.0 - 100.0 fL   MCH 30.2 26.0 - 34.0 pg   MCHC 33.7 30.0 - 36.0 g/dL   RDW 86.9 88.4 - 84.4 %   Platelets 206 150 - 400 K/uL   nRBC 0.0 0.0 - 0.2 %   Neutrophils Relative % 53 %   Neutro  Abs 2.2 1.7 - 7.7 K/uL   Lymphocytes Relative 35 %   Lymphs Abs 1.5 0.7 - 4.0 K/uL   Monocytes Relative 9 %   Monocytes Absolute 0.4 0.1 - 1.0 K/uL   Eosinophils Relative 1 %   Eosinophils Absolute 0.0 0.0 - 0.5 K/uL   Basophils Relative 1 %   Basophils Absolute 0.0 0.0 - 0.1 K/uL   Immature Granulocytes 1 %   Abs Immature Granulocytes 0.05 0.00 - 0.07 K/uL  D-dimer, quantitative     Status: Abnormal   Collection Time: 08/22/24  5:49 PM  Result Value Ref Range   D-Dimer, Quant 0.81 (H) 0.00 - 0.50 ug/mL-FEU  Iron  and TIBC     Status: None   Collection Time: 08/22/24  5:49 PM  Result Value Ref Range   Iron  53 28 - 170 ug/dL   TIBC 664 749 - 549 ug/dL   Saturation Ratios 16 10.4 - 31.8 %   UIBC 282 ug/dL  Ferritin     Status: None   Collection Time: 08/22/24  5:49 PM  Result Value Ref Range   Ferritin 66 11 - 307 ng/mL  Comprehensive metabolic panel with GFR     Status: Abnormal   Collection Time: 08/22/24  5:49 PM  Result Value Ref Range   Sodium 134 (L) 135 - 145 mmol/L   Potassium 4.0 3.5 - 5.1 mmol/L   Chloride 107 98 - 111 mmol/L   CO2 20 (L) 22 - 32 mmol/L   Glucose, Bld 82 70 - 99 mg/dL   BUN <5 (L) 6 - 20 mg/dL   Creatinine, Ser 9.47 0.44 - 1.00 mg/dL   Calcium 9.4 8.9 - 89.6 mg/dL   Total Protein 6.6 6.5 - 8.1 g/dL   Albumin 3.3 (L) 3.5 - 5.0 g/dL   AST 20 15 - 41 U/L   ALT 14 0 - 44 U/L   Alkaline Phosphatase 27 (L) 38 - 126 U/L   Total Bilirubin 0.7 0.0 - 1.2 mg/dL   GFR, Estimated >39 >39 mL/min   Anion gap 7 5 - 15    Imaging:  No results found.  MDM & MAU COURSE  MDM: High  MAU Course: -Tachycardic, otherwise vitals within normal limits. -CBC and iron  studies for possible anemia. -CMP for  electrolytes, renal and hepatic function. Respiratory panel for flu, COVID, RSV. -D-dimer for possible PE. -D-dimer elevated, will get CTPA. -CBC with decreased Hgb 11.4, normocytic anemia. May be worthwhile to see hematology given normal iron  levels. -CMP without significant abnormalities. -Respiratory panel negative. -CTPA pending. Care handed to Ilona Cedar CNM at 2000  Differential diagnosis considered for shortness of breath includes but is not limited to: physiologic SOB of pregnancy, URI, asthma, anemia, valvular heart disease, anxiety/panic disorder, pulmonary embolism   Orders Placed This Encounter  Procedures   Resp panel by RT-PCR (RSV, Flu A&B, Covid) Anterior Nasal Swab   CT Angio Chest Pulmonary Embolism (PE) W or WO Contrast   CBC with Differential/Platelet   D-dimer, quantitative   Iron  and TIBC   Ferritin   Comprehensive metabolic panel with GFR   No orders of the defined types were placed in this encounter.   ASSESSMENT  No diagnosis found.  PLAN  Discharge home in stable condition with return precautions.      Allergies as of 08/22/2024 - Review Complete 08/22/2024  Allergen Reaction Noted   Pork-derived products Other (See Comments) 07/25/2022    Joesph DELENA Sear, PA

## 2024-09-01 ENCOUNTER — Other Ambulatory Visit

## 2024-09-01 ENCOUNTER — Encounter: Payer: Self-pay | Admitting: Obstetrics and Gynecology

## 2024-09-01 ENCOUNTER — Ambulatory Visit (INDEPENDENT_AMBULATORY_CARE_PROVIDER_SITE_OTHER): Admitting: Obstetrics and Gynecology

## 2024-09-01 VITALS — BP 129/77 | HR 87 | Wt 171.2 lb

## 2024-09-01 DIAGNOSIS — O99012 Anemia complicating pregnancy, second trimester: Secondary | ICD-10-CM

## 2024-09-01 DIAGNOSIS — Z3A16 16 weeks gestation of pregnancy: Secondary | ICD-10-CM

## 2024-09-01 DIAGNOSIS — Z3480 Encounter for supervision of other normal pregnancy, unspecified trimester: Secondary | ICD-10-CM

## 2024-09-01 DIAGNOSIS — D509 Iron deficiency anemia, unspecified: Secondary | ICD-10-CM

## 2024-09-01 NOTE — Progress Notes (Signed)
  HIGH-RISK PREGNANCY OFFICE VISIT Patient name: Barbara Rice MRN [id number]  Date of birth: 16-Nov-1996 Chief Complaint:   Routine Prenatal Visit  History of Present Illness:   Barbara Rice is a 28 y.o. G12P0000 female at [redacted]w[redacted]d with an Estimated Date of Delivery: 02/16/25 being seen today for ongoing management of a high-risk pregnancy complicated by seizure-like activity Today she reports multiple complaints of GERD, diarrhea and constipation, bitter taste in mouth when taking gummy PNV and weakness and heart palpitations when taking PNV. Contractions: Not present. Vag. Bleeding: None.  Movement: Present. denies leaking of fluid.  Review of Systems:   Pertinent items are noted in HPI Denies abnormal vaginal discharge w/ itching/odor/irritation, headaches, visual changes, shortness of breath, chest pain, abdominal pain, severe nausea/vomiting, or problems with urination or bowel movements unless otherwise stated above. Pertinent History Reviewed:  Reviewed past medical,surgical, social, obstetrical and family history.  Reviewed problem list, medications and allergies. Physical Assessment:   Vitals:   09/01/24 1013  BP: 129/77  Pulse: 87  Weight: 171 lb 3.2 oz (77.7 kg)  Body mass index is 26.81 kg/m.           Physical Examination:   General appearance: alert, well appearing, and in no distress, oriented to person, place, and time, and normal appearing weight  Mental status: alert, oriented to person, place, and time, normal mood, behavior, speech, dress, motor activity, and thought processes  Skin: warm & dry   Extremities: Edema: None    Cardiovascular: normal heart rate noted  Respiratory: normal respiratory effort, no distress  Abdomen: gravid, soft, non-tender  Pelvic: Cervical exam deferred         Fetal Status: Fetal Heart Rate (bpm): 141   Movement: Present    Fetal Surveillance Testing today: none   No results found for this or any previous visit (from the past 24  hours).  Assessment & Plan:  1) High-risk pregnancy G1P0000 at [redacted]w[redacted]d with an Estimated Date of Delivery: 02/16/25   2) Supervision of other normal pregnancy, antepartum (Primary) - AFP, Serum, Open Spina Bifida - Advised that all complaints seem to be normal variations of pregnancy - Advised to try different brands of OTC PNV to see which ones are tolerable OR just eating a very healthy well-balanced diet - Already taking Flexeril  prn >> ok for lower back pain  3) Maternal iron  deficiency anemia affecting pregnancy in second trimester, antepartum - Taking iron  supplements  4) [redacted] weeks gestation of pregnancy - AFP, Serum, Open Spina Bifida   Meds: No orders of the defined types were placed in this encounter.   Labs/procedures today: none  Treatment Plan:    Reviewed: Preterm labor symptoms and general obstetric precautions including but not limited to vaginal bleeding, contractions, leaking of fluid and fetal movement were reviewed in detail with the patient.  All questions were answered. Has home bp cuff. Check bp weekly, let us  know if >140/90.   Follow-up: Return in about 4 weeks (around 09/29/2024) for Return OB visit.  Orders Placed This Encounter  Procedures   AFP, Serum, Open Spina Bifida   Ala Cart MSN, CNM 09/01/2024 12:11 PM

## 2024-09-01 NOTE — Progress Notes (Signed)
 Pt presents for ROB visit. Pt c/o n/v, heart palpations and weakness with prenatal tablets and bitter taste with gummies. C/o low back pain that is more intense on right side. Hx of sciatica

## 2024-09-03 ENCOUNTER — Ambulatory Visit: Payer: Self-pay | Admitting: Obstetrics and Gynecology

## 2024-09-03 LAB — AFP, SERUM, OPEN SPINA BIFIDA
AFP MoM: 1.82
AFP Value: 61 ng/mL
Gest. Age on Collection Date: 16 wk
Maternal Age At EDD: 30.3 a
OSBR Risk 1 IN: 2437
Test Results:: NEGATIVE
Weight: 171 [lb_av]

## 2024-09-06 ENCOUNTER — Other Ambulatory Visit: Payer: Self-pay | Admitting: Hematology and Oncology

## 2024-09-06 ENCOUNTER — Inpatient Hospital Stay: Attending: Hematology and Oncology

## 2024-09-06 ENCOUNTER — Inpatient Hospital Stay (HOSPITAL_BASED_OUTPATIENT_CLINIC_OR_DEPARTMENT_OTHER): Admitting: Hematology and Oncology

## 2024-09-06 VITALS — BP 118/70 | HR 78 | Temp 98.0°F | Resp 14 | Wt 171.4 lb

## 2024-09-06 DIAGNOSIS — D509 Iron deficiency anemia, unspecified: Secondary | ICD-10-CM | POA: Insufficient documentation

## 2024-09-06 DIAGNOSIS — O99112 Other diseases of the blood and blood-forming organs and certain disorders involving the immune mechanism complicating pregnancy, second trimester: Secondary | ICD-10-CM | POA: Diagnosis present

## 2024-09-06 DIAGNOSIS — Z8041 Family history of malignant neoplasm of ovary: Secondary | ICD-10-CM | POA: Diagnosis not present

## 2024-09-06 DIAGNOSIS — D500 Iron deficiency anemia secondary to blood loss (chronic): Secondary | ICD-10-CM | POA: Diagnosis not present

## 2024-09-06 DIAGNOSIS — D72818 Other decreased white blood cell count: Secondary | ICD-10-CM

## 2024-09-06 DIAGNOSIS — Z3A16 16 weeks gestation of pregnancy: Secondary | ICD-10-CM | POA: Insufficient documentation

## 2024-09-06 DIAGNOSIS — O99012 Anemia complicating pregnancy, second trimester: Secondary | ICD-10-CM | POA: Diagnosis present

## 2024-09-06 DIAGNOSIS — O99332 Smoking (tobacco) complicating pregnancy, second trimester: Secondary | ICD-10-CM | POA: Diagnosis not present

## 2024-09-06 DIAGNOSIS — D709 Neutropenia, unspecified: Secondary | ICD-10-CM | POA: Insufficient documentation

## 2024-09-06 DIAGNOSIS — Z7982 Long term (current) use of aspirin: Secondary | ICD-10-CM | POA: Diagnosis not present

## 2024-09-06 DIAGNOSIS — O99352 Diseases of the nervous system complicating pregnancy, second trimester: Secondary | ICD-10-CM | POA: Insufficient documentation

## 2024-09-06 DIAGNOSIS — Z79899 Other long term (current) drug therapy: Secondary | ICD-10-CM | POA: Diagnosis not present

## 2024-09-06 LAB — FERRITIN: Ferritin: 81 ng/mL (ref 11–307)

## 2024-09-06 LAB — CBC WITH DIFFERENTIAL (CANCER CENTER ONLY)
Abs Immature Granulocytes: 0.07 K/uL (ref 0.00–0.07)
Basophils Absolute: 0 K/uL (ref 0.0–0.1)
Basophils Relative: 1 %
Eosinophils Absolute: 0 K/uL (ref 0.0–0.5)
Eosinophils Relative: 1 %
HCT: 34.5 % — ABNORMAL LOW (ref 36.0–46.0)
Hemoglobin: 11.9 g/dL — ABNORMAL LOW (ref 12.0–15.0)
Immature Granulocytes: 1 %
Lymphocytes Relative: 29 %
Lymphs Abs: 1.4 K/uL (ref 0.7–4.0)
MCH: 30.9 pg (ref 26.0–34.0)
MCHC: 34.5 g/dL (ref 30.0–36.0)
MCV: 89.6 fL (ref 80.0–100.0)
Monocytes Absolute: 0.3 K/uL (ref 0.1–1.0)
Monocytes Relative: 6 %
Neutro Abs: 3 K/uL (ref 1.7–7.7)
Neutrophils Relative %: 62 %
Platelet Count: 193 K/uL (ref 150–400)
RBC: 3.85 MIL/uL — ABNORMAL LOW (ref 3.87–5.11)
RDW: 12.8 % (ref 11.5–15.5)
WBC Count: 4.9 K/uL (ref 4.0–10.5)
nRBC: 0 % (ref 0.0–0.2)

## 2024-09-06 LAB — CMP (CANCER CENTER ONLY)
ALT: 19 U/L (ref 0–44)
AST: 19 U/L (ref 15–41)
Albumin: 4.3 g/dL (ref 3.5–5.0)
Alkaline Phosphatase: 34 U/L — ABNORMAL LOW (ref 38–126)
Anion gap: 7 (ref 5–15)
BUN: 6 mg/dL (ref 6–20)
CO2: 25 mmol/L (ref 22–32)
Calcium: 10 mg/dL (ref 8.9–10.3)
Chloride: 103 mmol/L (ref 98–111)
Creatinine: 0.53 mg/dL (ref 0.44–1.00)
GFR, Estimated: >60 mL/min (ref >60)
Glucose, Bld: 82 mg/dL (ref 70–99)
Potassium: 4 mmol/L (ref 3.5–5.1)
Sodium: 135 mmol/L (ref 135–145)
Total Bilirubin: 0.4 mg/dL (ref 0.0–1.2)
Total Protein: 7.5 g/dL (ref 6.5–8.1)

## 2024-09-06 LAB — RETIC PANEL
Immature Retic Fract: 14.6 % (ref 2.3–15.9)
RBC.: 3.85 MIL/uL — ABNORMAL LOW (ref 3.87–5.11)
Retic Count, Absolute: 110.1 K/uL (ref 19.0–186.0)
Retic Ct Pct: 2.9 % (ref 0.4–3.1)
Reticulocyte Hemoglobin: 34.2 pg (ref >27.9)

## 2024-09-06 LAB — IRON AND IRON BINDING CAPACITY (CC-WL,HP ONLY)
Iron: 105 ug/dL (ref 28–170)
Saturation Ratios: 26 % (ref 10.4–31.8)
TIBC: 403 ug/dL (ref 250–450)
UIBC: 298 ug/dL (ref 148–442)

## 2024-09-06 MED ORDER — SIMETHICONE 80 MG PO TABS: 2.0000 | ORAL_TABLET | Freq: Four times a day (QID) | ORAL | 1 refills | Status: AC | PRN

## 2024-09-06 NOTE — Progress Notes (Signed)
 [facility] [phone]   [phone]  PROGRESS NOTE:  Patient Care Team: Cleotilde, Virginia  E, PA as PCP - General (Internal Medicine)  CHIEF COMPLAINTS/PURPOSE OF CONSULTATION:  Leukopenia/neutropenia. Iron  deficiency anemia Iron  Deficiency Anemia in pregnancy   HISTORY OF PRESENTING ILLNESS:  Barbara Rice 28 y.o. female returns to the clinic for a follow up for evaluation of leukopenia and iron  deficiency anemia. She is unaccompanied for this visit.   On exam today, Barbara Rice reports that she is currently [redacted] weeks pregnant.  Her due date is February 16, 2025 and she will be giving birth to a baby boy.  She reports that she has been having issues with nausea consistent with morning sickness.  She reports that he has been taking her iron  pills but they were causing some constipation and so she is taking them every other day with some occasional stool softener.  She reports that she did recently have her iron  levels checked and her numbers were in the acceptable range.  She reports otherwise she is doing her best to try to eat iron  rich foods including red meat and dark green leafy vegetables.  She reports overall she is not having any lightheadedness, dizziness, shortness of breath.  She is not having any issues with headaches.  A full 10 point ROS is otherwise negative.  Today we discussed management of iron  deficiency in pregnancy.  Details of our conversation are noted below.  MEDICAL HISTORY:  Past Medical History:  Diagnosis Date   Anemia    Sciatic nerve pain    Seizure disorder during pregnancy, antepartum (HCC)    Pt reports hx of seizures since 2023. Describes falling back from sittting, jerking movements, uncontrolled tears. Denies urinary incontinence. Reports fatigue afterwards.    SURGICAL HISTORY: Past Surgical History:  Procedure Laterality Date   NO PAST SURGERIES      SOCIAL HISTORY: Social History   Socioeconomic  History   Marital status: Married    Spouse name: Not on file   Number of children: Not on file   Years of education: Not on file   Highest education level: Not on file  Occupational History   Not on file  Tobacco Use   Smoking status: Never   Smokeless tobacco: Never  Vaping Use   Vaping status: Never Used  Substance and Sexual Activity   Alcohol use: Never   Drug use: Never   Sexual activity: Yes    Birth control/protection: None  Other Topics Concern   Not on file  Social History Narrative   Not on file   Social Drivers of Health   Financial Resource Strain: Not on file  Food Insecurity: Not on file  Transportation Needs: Not on file  Physical Activity: Not on file  Stress: Not on file  Social Connections: Unknown (05/13/2022)   [facility]   Social Network    Social Network: Not on file  Intimate Partner Violence: Unknown (04/04/2022)   Received from [facility]   HITS    Physically Hurt: Not on file    Insult or Talk Down To: Not on file    Threaten Physical Harm: Not on file    Scream or Curse: Not on file    FAMILY HISTORY: Family History  Problem Relation Age of Onset   Healthy Mother    Cancer Father        prostate   Hypertension Father    Cervical cancer Paternal Aunt  Ovarian cancer Paternal Aunt    Cancer Cousin     ALLERGIES:  is allergic to pork-derived products.  MEDICATIONS:  Current Outpatient Medications  Medication Sig Dispense Refill   acetaminophen  (TYLENOL ) 500 MG tablet Take 2 tablets (1,000 mg total) by mouth every 6 (six) hours as needed for moderate pain (pain score 4-6) or mild pain (pain score 1-3).     aspirin  81 MG chewable tablet Chew 1 tablet (81 mg total) by mouth daily. 30 tablet 8   Blood Pressure Monitoring (BLOOD PRESSURE KIT) DEVI 1 Device by Does not apply route once a week. 1 each 0   Cholecalciferol (VITAMIN D-3 PO) Take by mouth. (Patient not taking: Reported on 09/01/2024)     cyclobenzaprine   (FLEXERIL ) 10 MG tablet Take 1 tablet (10 mg total) by mouth 2 (two) times daily as needed for muscle spasms. (Patient not taking: Reported on 09/01/2024) 20 tablet 0   Doxylamine -Pyridoxine  10-10 MG TBEC Take 2 tablets by mouth at bedtime. May also take 1 tab in am and 1 tab in afternoon 100 tablet 1   famotidine  (PEPCID ) 20 MG tablet Take 1 tablet (20 mg total) by mouth 2 (two) times daily. 60 tablet 0   ferrous sulfate  325 (65 FE) MG EC tablet Take 1 tablet (325 mg total) by mouth daily with breakfast. 30 tablet 6   hydrochlorothiazide  (MICROZIDE ) 12.5 MG capsule Take 1 capsule (12.5 mg total) by mouth daily. (Patient not taking: Reported on 09/01/2024) 30 capsule 0   hydrOXYzine  (ATARAX ) 10 MG tablet Take 1 tablet (10 mg total) by mouth 3 (three) times daily as needed. (Patient not taking: Reported on 09/01/2024) 30 tablet 0   Polyethylene Glycol 3350 (MIRALAX PO) Take by mouth.     Prenat-FeAsp-Meth-FA-DHA w/o A (PRENATE PIXIE ) 10-0.6-0.4-200 MG CAPS Take 1 tablet by mouth daily. (Patient not taking: Reported on 09/01/2024) 30 capsule 11   Prenatal Vit-Fe Fumarate-FA (PRENATAL VITAMINS PO) Take by mouth. (Patient not taking: Reported on 09/01/2024)     Simethicone  80 MG TABS Take 2 tablets (160 mg total) by mouth 4 (four) times daily as needed (gas pain). 60 tablet 1   No current facility-administered medications for this visit.    REVIEW OF SYSTEMS:   Constitutional: ( - ) fevers, ( - )  chills , ( - ) night sweats Eyes: ( - ) blurriness of vision, ( - ) double vision, ( - ) watery eyes Ears, nose, mouth, throat, and face: ( - ) mucositis, ( - ) sore throat Respiratory: ( - ) cough, ( - ) dyspnea, ( - ) wheezes Cardiovascular: ( - ) palpitation, ( - ) chest discomfort, ( - ) lower extremity swelling Gastrointestinal:  ( - ) nausea, ( - ) heartburn, ( - ) change in bowel habits Skin: ( - ) abnormal skin rashes Lymphatics: ( - ) new lymphadenopathy, ( - ) easy bruising Neurological: ( - ) numbness,  ( - ) tingling, ( - ) new weaknesses Behavioral/Psych: ( - ) mood change, ( - ) new changes  All other systems were reviewed with the patient and are negative.  PHYSICAL EXAMINATION: ECOG PERFORMANCE STATUS: 1 - Symptomatic but completely ambulatory  Vitals:   09/06/24 1049  BP: 118/70  Pulse: 78  Resp: 14  Temp: 98 F (36.7 C)  SpO2: 100%     Filed Weights   09/06/24 1049  Weight: 171 lb 6.4 oz (77.7 kg)      GENERAL: well appearing female in NAD  SKIN: skin color, texture, turgor are normal, no rashes or significant lesions EYES: conjunctiva are pink and non-injected, sclera clear NECK: supple, non-tender LYMPH:  no palpable lymphadenopathy in the cervical or supraclavicular lymph nodes.  LUNGS: clear to auscultation and percussion with normal breathing effort HEART: regular rate & rhythm and no murmurs. No lower extremity edema.  Musculoskeletal: no cyanosis of digits and no clubbing  PSYCH: alert & oriented x 3, fluent speech NEURO: no focal motor/sensory deficits  LABORATORY DATA:  I have reviewed the data as listed    Latest Ref Rng & Units 09/06/2024   10:26 AM 08/22/2024    4:52 PM 08/03/2024   11:32 AM  CBC  WBC 4.0 - 10.5 K/uL 4.9  4.2  4.1   Hemoglobin 12.0 - 15.0 g/dL 88.0  88.5  87.5   Hematocrit 36.0 - 46.0 % 34.5  33.8  37.6   Platelets 150 - 400 K/uL 193  206  217        Latest Ref Rng & Units 09/06/2024   10:26 AM 08/22/2024    5:49 PM 03/08/2024    1:22 PM  CMP  Glucose 70 - 99 mg/dL 82  82  97   BUN 6 - 20 mg/dL 6  <5  7   Creatinine 9.55 - 1.00 mg/dL 9.46  9.47  9.20   Sodium 135 - 145 mmol/L 135  134  139   Potassium 3.5 - 5.1 mmol/L 4.0  4.0  4.3   Chloride 98 - 111 mmol/L 103  107  105   CO2 22 - 32 mmol/L 25  20  28    Calcium 8.9 - 10.3 mg/dL 89.9  9.4  9.4   Total Protein 6.5 - 8.1 g/dL 7.5  6.6  7.4   Total Bilirubin 0.0 - 1.2 mg/dL 0.4  0.7  0.6   Alkaline Phos 38 - 126 U/L 34  27  39   AST 15 - 41 U/L 19  20  17    ALT 0 - 44 U/L  19  14  9       RADIOGRAPHIC STUDIES: I have personally reviewed the radiological images as listed and agreed with the findings in the report. CT Angio Chest Pulmonary Embolism (PE) W or WO Contrast Result Date: 08/22/2024 CLINICAL DATA:  Pulmonary embolism (PE) suspected, low to intermediate prob, positive D-dimer Pulmonary embolism (PE) suspected, pregnant EXAM: CT ANGIOGRAPHY CHEST WITH CONTRAST TECHNIQUE: Multidetector CT imaging of the chest was performed using the standard protocol during bolus administration of intravenous contrast. Multiplanar CT image reconstructions and MIPs were obtained to evaluate the vascular anatomy. RADIATION DOSE REDUCTION: This exam was performed according to the departmental dose-optimization program which includes automated exposure control, adjustment of the mA and/or kV according to patient size and/or use of iterative reconstruction technique. CONTRAST:  75mL OMNIPAQUE  IOHEXOL  350 MG/ML SOLN COMPARISON:  CT angio chest 07/24/2022 FINDINGS: Cardiovascular: Satisfactory opacification of the pulmonary arteries to the segmental level. No evidence of pulmonary embolism. The main pulmonary artery is normal in caliber. Normal heart size. No significant pericardial effusion. The thoracic aorta is normal in caliber. No atherosclerotic plaque of the thoracic aorta. No coronary artery calcifications. Mediastinum/Nodes: No enlarged mediastinal, hilar, or axillary lymph nodes. Thyroid gland, trachea, and esophagus demonstrate no significant findings. Lungs/Pleura: No focal consolidation. No pulmonary nodule. No pulmonary mass. No pleural effusion. No pneumothorax. Upper Abdomen: No acute abnormality. Musculoskeletal: No chest wall abnormality. No suspicious lytic or blastic osseous lesions. No acute displaced fracture.  Multilevel degenerative changes of the spine. Review of the MIP images confirms the above findings. IMPRESSION: 1. No pulmonary embolus. 2. No acute intrathoracic  abnormality. Electronically Signed   By: Morgane  Naveau M.D.   On: 08/22/2024 22:39    ASSESSMENT & PLAN Barbara Rice is a 28 y.o. female returns for a follow up for evaluation of leukopenia/neutropenia and iron  deficiency anemia.   Patient is currently [redacted] weeks pregnant with a due date in February 2026.  Today we discussed iron  management in pregnancy.  We discussed the need for maintaining iron  for both her and the baby.  Additionally we discussed that pregnancy is a high iron  demand state.  Patient is currently tolerating p.o. iron  therapy which we encouraged her to continue.  If it were to cause difficulty with constipation would recommend considering IV iron  therapy if her iron  levels were to drop.  She voiced understanding of our findings and recommendation moving forward.  # Leukopenia/Neutropenia  --Workup from 09/01/2023 ruled out nutritional deficiencies, hepatitis B/C, HIV and inflammatory process.  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  --Labs today WBC 4.9, ANC 3.0  --No indication for bone marrow biopsy at this time  # Iron  Deficiency Anemia 2/2 to GYN Bleeding # Iron  Deficiency Anemia in Pregnancy  -- Findings are consistent with iron  deficiency anemia secondary to patient's menorrhagia --Encouraged her to follow-up with OB/GYN  --Continue ferrous sulfate  325 mg daily with a source of vitamin C every other day as tolerated.  If constipation worsens recommended continuation. --Labs today show white blood cell 4.9, hemoglobin 11.9, MCV 89.6, platelets 193 --No need for IV iron  at this time, would consider if her iron  levels drop at next check in 3 months.  #Right inguinal lymphadenopathy: --Underwent FNA of right inguinal lymph node on 02/20/2022. Pathology was negative for malignant cells.   Follow up: --3 months: labs and follow up.  No orders of the defined types were placed in this encounter.   All questions were  answered. The patient knows to call the clinic with any problems, questions or concerns.  I have spent a total of 30 minutes minutes of face-to-face and non-face-to-face time, preparing to see the patient, performing a medically appropriate examination, counseling and educating the patient, ordering tests, documenting clinical information in the electronic health record, and care coordination.   Norleen IVAR Kidney, MD Department of Hematology/Oncology [facility] Phone: [phone] Pager: [phone] Email: [email or web address]

## 2024-09-28 ENCOUNTER — Inpatient Hospital Stay (HOSPITAL_COMMUNITY): Admission: AD | Admit: 2024-09-28 | Discharge: 2024-09-28 | Disposition: A | Discharge status: Home / Self Care

## 2024-09-28 ENCOUNTER — Inpatient Hospital Stay (HOSPITAL_COMMUNITY)

## 2024-09-28 ENCOUNTER — Encounter (HOSPITAL_COMMUNITY): Payer: Self-pay | Admitting: Obstetrics and Gynecology

## 2024-09-28 DIAGNOSIS — O26892 Other specified pregnancy related conditions, second trimester: Secondary | ICD-10-CM

## 2024-09-28 DIAGNOSIS — O99612 Diseases of the digestive system complicating pregnancy, second trimester: Secondary | ICD-10-CM | POA: Diagnosis not present

## 2024-09-28 DIAGNOSIS — R109 Unspecified abdominal pain: Secondary | ICD-10-CM | POA: Diagnosis not present

## 2024-09-28 DIAGNOSIS — Z3A19 19 weeks gestation of pregnancy: Secondary | ICD-10-CM | POA: Diagnosis not present

## 2024-09-28 DIAGNOSIS — K5900 Constipation, unspecified: Secondary | ICD-10-CM | POA: Diagnosis not present

## 2024-09-28 DIAGNOSIS — R102 Pelvic and perineal pain unspecified side: Secondary | ICD-10-CM | POA: Insufficient documentation

## 2024-09-28 LAB — WET PREP, GENITAL
Clue Cells Wet Prep HPF POC: NONE SEEN
Sperm: NONE SEEN
Trich, Wet Prep: NONE SEEN
WBC, Wet Prep HPF POC: <10 (ref <10)
Yeast Wet Prep HPF POC: NONE SEEN

## 2024-09-28 LAB — CBC WITH DIFFERENTIAL/PLATELET
Abs Immature Granulocytes: 0.08 K/uL — ABNORMAL HIGH (ref 0.00–0.07)
Basophils Absolute: 0 K/uL (ref 0.0–0.1)
Basophils Relative: 0 %
Eosinophils Absolute: 0 K/uL (ref 0.0–0.5)
Eosinophils Relative: 0 %
HCT: 35.6 % — ABNORMAL LOW (ref 36.0–46.0)
Hemoglobin: 12.1 g/dL (ref 12.0–15.0)
Immature Granulocytes: 1 %
Lymphocytes Relative: 20 %
Lymphs Abs: 1.3 K/uL (ref 0.7–4.0)
MCH: 31 pg (ref 26.0–34.0)
MCHC: 34 g/dL (ref 30.0–36.0)
MCV: 91.3 fL (ref 80.0–100.0)
Monocytes Absolute: 0.4 K/uL (ref 0.1–1.0)
Monocytes Relative: 7 %
Neutro Abs: 4.4 K/uL (ref 1.7–7.7)
Neutrophils Relative %: 72 %
Platelets: 202 K/uL (ref 150–400)
RBC: 3.9 MIL/uL (ref 3.87–5.11)
RDW: 12.3 % (ref 11.5–15.5)
WBC: 6.2 K/uL (ref 4.0–10.5)
nRBC: 0 % (ref 0.0–0.2)

## 2024-09-28 LAB — URINALYSIS, ROUTINE W REFLEX MICROSCOPIC
Bilirubin Urine: NEGATIVE
Glucose, UA: NEGATIVE mg/dL
Hgb urine dipstick: NEGATIVE
Ketones, ur: NEGATIVE mg/dL
Leukocytes,Ua: NEGATIVE
Nitrite: NEGATIVE
Protein, ur: NEGATIVE mg/dL
Specific Gravity, Urine: 1.003 — ABNORMAL LOW (ref 1.005–1.030)
pH: 7 (ref 5.0–8.0)

## 2024-09-28 LAB — GC/CHLAMYDIA PROBE AMP (~~LOC~~) NOT AT ARMC
Chlamydia: NEGATIVE
Comment: NEGATIVE
Comment: NORMAL
Neisseria Gonorrhea: NEGATIVE

## 2024-09-28 LAB — COMPREHENSIVE METABOLIC PANEL WITH GFR
ALT: 17 U/L (ref 0–44)
AST: 21 U/L (ref 15–41)
Albumin: 3.4 g/dL — ABNORMAL LOW (ref 3.5–5.0)
Alkaline Phosphatase: 40 U/L (ref 38–126)
Anion gap: 13 (ref 5–15)
BUN: <5 mg/dL — ABNORMAL LOW (ref 6–20)
CO2: 20 mmol/L — ABNORMAL LOW (ref 22–32)
Calcium: 10 mg/dL (ref 8.9–10.3)
Chloride: 101 mmol/L (ref 98–111)
Creatinine, Ser: 0.65 mg/dL (ref 0.44–1.00)
GFR, Estimated: >60 mL/min (ref >60)
Glucose, Bld: 93 mg/dL (ref 70–99)
Potassium: 3.3 mmol/L — ABNORMAL LOW (ref 3.5–5.1)
Sodium: 134 mmol/L — ABNORMAL LOW (ref 135–145)
Total Bilirubin: 0.3 mg/dL (ref 0.0–1.2)
Total Protein: 7 g/dL (ref 6.5–8.1)

## 2024-09-28 LAB — LACTIC ACID, PLASMA: Lactic Acid, Venous: 1.3 mmol/L (ref 0.5–1.9)

## 2024-09-28 LAB — ABO/RH: ABO/RH(D): O POS

## 2024-09-28 MED ORDER — MORPHINE SULFATE (PF) 4 MG/ML IV SOLN
2.0000 mg | INTRAVENOUS | Status: DC | PRN
  Administered 2024-09-28: 2 mg via INTRAVENOUS
  Filled 2024-09-28: qty 1

## 2024-09-28 MED ORDER — CYCLOBENZAPRINE HCL 5 MG PO TABS: 10.0000 mg | ORAL_TABLET | Freq: Once | ORAL | Status: DC

## 2024-09-28 MED ORDER — POLYETHYLENE GLYCOL 3350 17 GM/SCOOP PO POWD
17.0000 g | Freq: Every day | ORAL | 0 refills | Status: AC
Start: 2024-09-28 — End: ?

## 2024-09-28 MED ORDER — ACETAMINOPHEN 500 MG PO TABS
1000.0000 mg | ORAL_TABLET | Freq: Once | ORAL | Status: AC
  Administered 2024-09-28: 1000 mg via ORAL
  Filled 2024-09-28: qty 2

## 2024-09-28 MED ORDER — MORPHINE SULFATE (PF) 4 MG/ML IV SOLN
4.0000 mg | Freq: Once | INTRAVENOUS | Status: AC
  Administered 2024-09-28: 4 mg via INTRAVENOUS
  Filled 2024-09-28: qty 1

## 2024-09-28 MED ORDER — TRAMADOL HCL 50 MG PO TABS: 50.0000 mg | ORAL_TABLET | Freq: Four times a day (QID) | ORAL | 0 refills | Status: DC | PRN

## 2024-09-28 MED ORDER — LACTATED RINGERS IV BOLUS
1000.0000 mL | Freq: Once | INTRAVENOUS | Status: AC
  Administered 2024-09-28: 1000 mL via INTRAVENOUS

## 2024-09-28 NOTE — MAU Note (Signed)
 Pt says yesterday at 12 noon- - feeling cramps lower abd and vagina - shooting pain - 7/10 Took- Flexeril  - no relief.  Took Tyl at 1pm. Slept- woke at 6pm-8/10 Now pain constant since 11pm-  Took Flexeril  again at 8 pm.  [facility]

## 2024-09-28 NOTE — MAU Note (Cosign Needed Addendum)
 Maternal Assessment Unit Provider Note  Subjective: [name] is a 28 y.o. G1P0000 pregnant female at [redacted]w[redacted]d who presents to MAU today with complaint of pelvic and abdominal pain and cramping.   Patient presents with pelvic and abdominal pain and cramping which began at 12 noon yesterday. She took Tylenol  and a muscle relaxant which improved the pain, and she slept for a while. At 8 pm pain became more constant, and was not improved with a muscle relaxant she took at that time.Pain currently feels like contractions. Reports feeling the need to have a bowel movement, no dysuria, no vaginal bleeding. Had a small bowel movement earlier before coming. No blood in stool. Also, reports low back/sacrum pain. No leaking fluid.  After pain medications, she reports the worst pain is now located suprapubic and LLQ.   Reports she has a hx of LEEP.  Receives care at [facility]. Prenatal records reviewed.  Pertinent items noted in HPI and remainder of comprehensive ROS otherwise negative.   Objective: BP 137/79 (BP Location: Right Arm)   Pulse (!) 112   Temp 98.3 F (36.8 C) (Oral)   Resp 16   Ht 5' 6 (1.676 m)   Wt 79.5 kg   LMP 05/12/2024 (Exact Date)   BMI 28.28 kg/m  Physical Exam Constitutional:      General: She is in acute distress.     Appearance: She is well-developed.     Comments: Writhing in pain, doubled over in room.   HENT:     Head: Normocephalic and atraumatic.  Cardiovascular:     Rate and Rhythm: Regular rhythm. Tachycardia present.  Pulmonary:     Effort: Pulmonary effort is normal.     Breath sounds: Normal breath sounds.  Abdominal:     Tenderness: There is abdominal tenderness in the suprapubic area.     Comments: 19wk gravid uterus. Palpation of RLQ causes pain in the LLQ/suprapubic region  Genitourinary:    Comments: Speculum exam: os is closed, several pink/flesh-colored polypoid protrusions attached on cervix. No significant discharge. No bleeding.   Skin:    General: Skin is warm and dry.  Neurological:     General: No focal deficit present.     Mental Status: She is alert.    MDM: moderate risk  MAU Course:  Time: 0530  Patient assessed. She is in acute distress. General appearance is of woman in labor. Speculum exam done. No gross cervical dilations or significant findings. VSS and normal aside from tachycardia-likely due to pain. IV fluids, 2mg  Morphine, 1g acetaminophen , CBC/CMP/Lactic acid, GC/Chlam, wet prep, UA and OB US  ordered.   No hx of subchorionic hemorrhage mentioned on 2 prior US .  Bedside report by US  tech is no evidence for placental abruption, possible dilation of cervix with contractions.   Time: 0630 Results thus far are unremarkable. Discussed case with Dr. Erik. Recommends additional imaging MRI abdo/pelvis given concerning abdominal exam. Orders placed and discussed with MRI team. 4mg  additional morphine given.   Cervical exam: 0/thick/-2  Vital signs normal  Current ddx is intra-abdominal pathology vs early pre-term labor  Medical screening exam complete  Time 0800 Given 10mg  cyclobenzaprine  for additional pain control. Awaiting results of MRI.  Patient reports LLQ pain is improved 7 out of 10. Now generalized suprapubic/uterine cramping.  Care transferred to [facility] at 0820   Trudy Leeroy NOVAK, MD 09/28/2024 5:23 AM    Imaging:  MR ABDOMEN WO CONTRAST Result Date: 09/28/2024 CLINICAL DATA:  Lower abdominal cramps and  shooting pain into the vagina. 19 week 6 day gestation. EXAM: MRI ABDOMEN AND PELVIS WITHOUT CONTRAST TECHNIQUE: Multiplanar multisequence MR imaging of the abdomen and pelvis was performed. No intravenous contrast was administered. COMPARISON:  None Available. FINDINGS: COMBINED FINDINGS FOR BOTH MR ABDOMEN AND PELVIS Lower chest: No acute findings Hepatobiliary: No suspicious focal abnormality in the liver on this study without intravenous contrast. Layering  sludge in the gallbladder without gallbladder wall thickening. No definite cholelithiasis. No pericholecystic fluid. No intrahepatic or extrahepatic biliary dilation. Pancreas: No focal mass lesion. No dilatation of the main duct. No intraparenchymal cyst. No peripancreatic edema. Spleen:  No splenomegaly. No suspicious focal mass lesion. Adrenals/Urinary Tract: No adrenal nodule or mass. Mild fullness noted in both intrarenal collecting systems and ureters. No focal mass lesion evident in either kidney. The urinary bladder appears normal for the degree of distention. Stomach/Bowel: Stomach is decompressed. Duodenum is normally positioned as is the ligament of Treitz. No small bowel wall thickening. No small bowel dilatation. The terminal ileum is normal. Appendix is not visualized. No substantial edema or inflammatory changes evident in the region of the cecum. Colon is nondilated. Vascular/Lymphatic: No abdominal aortic aneurysm. No abdominal lymphadenopathy. No pelvic lymphadenopathy. Reproductive: Intrauterine gestation evident.  No adnexal mass. Other: Trace free fluid is seen in the anterior right pelvis with small volume free fluid in the cul-de-sac. Musculoskeletal: No suspicious marrow signal abnormality. IMPRESSION: 1. No acute findings in the abdomen or pelvis. The appendix is not discretely visualized. If there is high clinical index of suspicion for acute appendicitis, surgical consultation may be warranted. 2. Mild fullness in both intrarenal collecting systems and proximal to mid ureters. Ureteral distension extends to the level where the ureters pass between the psoas muscles and the uterus. 3. Trace free fluid in the anterior right pelvis with small volume free fluid in the cul-de-sac. 4. Intrauterine gestation evident. Electronically Signed   By: Camellia Candle M.D.   On: 09/28/2024 08:35   MR PELVIS WO CONTRAST Result Date: 09/28/2024 CLINICAL DATA:  Lower abdominal cramps and shooting pain into  the vagina. 19 week 6 day gestation. EXAM: MRI ABDOMEN AND PELVIS WITHOUT CONTRAST TECHNIQUE: Multiplanar multisequence MR imaging of the abdomen and pelvis was performed. No intravenous contrast was administered. COMPARISON:  None Available. FINDINGS: COMBINED FINDINGS FOR BOTH MR ABDOMEN AND PELVIS Lower chest: No acute findings Hepatobiliary: No suspicious focal abnormality in the liver on this study without intravenous contrast. Layering sludge in the gallbladder without gallbladder wall thickening. No definite cholelithiasis. No pericholecystic fluid. No intrahepatic or extrahepatic biliary dilation. Pancreas: No focal mass lesion. No dilatation of the main duct. No intraparenchymal cyst. No peripancreatic edema. Spleen:  No splenomegaly. No suspicious focal mass lesion. Adrenals/Urinary Tract: No adrenal nodule or mass. Mild fullness noted in both intrarenal collecting systems and ureters. No focal mass lesion evident in either kidney. The urinary bladder appears normal for the degree of distention. Stomach/Bowel: Stomach is decompressed. Duodenum is normally positioned as is the ligament of Treitz. No small bowel wall thickening. No small bowel dilatation. The terminal ileum is normal. Appendix is not visualized. No substantial edema or inflammatory changes evident in the region of the cecum. Colon is nondilated. Vascular/Lymphatic: No abdominal aortic aneurysm. No abdominal lymphadenopathy. No pelvic lymphadenopathy. Reproductive: Intrauterine gestation evident.  No adnexal mass. Other: Trace free fluid is seen in the anterior right pelvis with small volume free fluid in the cul-de-sac. Musculoskeletal: No suspicious marrow signal abnormality. IMPRESSION: 1. No acute findings  in the abdomen or pelvis. The appendix is not discretely visualized. If there is high clinical index of suspicion for acute appendicitis, surgical consultation may be warranted. 2. Mild fullness in both intrarenal collecting systems and  proximal to mid ureters. Ureteral distension extends to the level where the ureters pass between the psoas muscles and the uterus. 3. Trace free fluid in the anterior right pelvis with small volume free fluid in the cul-de-sac. 4. Intrauterine gestation evident. Electronically Signed   By: Camellia Candle M.D.   On: 09/28/2024 08:35   US  MFM OB LIMITED Result Date: 09/28/2024 ----------------------------------------------------------------------  OBSTETRICS REPORT                        (Signed Final 09/28/2024 07:17 am) ---------------------------------------------------------------------- Patient Info  ID #:       [id number]                          D.O.B.:  10-09-1996 (27 yrs)(F)  Name:       Barbara Rice                Visit Date: 09/28/2024 05:56 am ---------------------------------------------------------------------- Performed By  Attending:        Fredia Fresh MD        Ref. Address:      [facility]  Performed By:     Burnard LITTIE Custard          Secondary Phy.:    [facility] MAU/Triage                    RDMS  Referred By:      Leeroy Pouch MD      [facility] ---------------------------------------------------------------------- Orders  #  Description                           Code        Ordered By  1  US  MFM OB LIMITED                     23184.98    LEEROY POUCH ----------------------------------------------------------------------  #  Order #                     Accession #                Episode #  1  [id number]                   [id number]                 [id number] ---------------------------------------------------------------------- Indications  Pelvic pain affecting pregnancy in second       O26.892  trimester (intense)  [redacted] weeks gestation of pregnancy                 Z3A.19 ---------------------------------------------------------------------- Fetal Evaluation  Num Of Fetuses:          1  Fetal Heart  Rate(bpm):  140  Cardiac Activity:        Observed  Presentation:            Cephalic  Placenta:                Posterior  P. Cord Insertion:       Visualized  Amniotic Fluid  AFI FV:      Within normal limits                              Largest Pocket(cm)                              5.3  Comment:    No placental abruption or previa identified. ---------------------------------------------------------------------- OB History  Gravidity:    1         Term:   0        Prem:   0        SAB:   0  TOP:          0       Ectopic:  0        Living: 0 ---------------------------------------------------------------------- Gestational Age  LMP:           19w 6d        Date:  05/12/24                  EDD:   02/16/25  Best:          19w 6d     Det. By:  LMP  (05/12/24)          EDD:   02/16/25 ---------------------------------------------------------------------- Anatomy  Diaphragm:             Appears normal         Abdomen:                Appears normal  Stomach:               Appears normal, left   Kidneys:                Appear normal                         sided ---------------------------------------------------------------------- Cervix Uterus Adnexa  Cervix  Length:            2.8  cm.  Normal appearance by transabdominal scan  Uterus  Single fibroid noted, see table below.  Right Ovary  Within normal limits.  Left Ovary  Within normal limits.  Adnexa  No abnormality visualized ---------------------------------------------------------------------- Myomas  Site                     L(cm)      W(cm)       D(cm)      Location  Right                    3.8        3.9         3.6        Intramural ----------------------------------------------------------------------  Blood Flow                  RI       PI       Comments ---------------------------------------------------------------------- Impression  Patient is being evaluated for c/o abdominal  pain.  A limited ultrasound study was performed. Normal amniotic  fluid. On  transabdominal scan, the cervix measures 2.8 cm.  No evidence of retroplacental hemorrhage.  Patient was uncomfortable during ultrasound.  Transvaginal ultrasound for cervical length may be  considered because of her history of LEEP. ----------------------------------------------------------------------                 Fredia Fresh, MD Electronically Signed Final Report   09/28/2024 07:17 am ----------------------------------------------------------------------     MDM:   No acute abdomen.  No elevated WBCs.  Limited OB US  and abdominal pelvic MRI are normal. Cervix remains closed so no evidence of preterm labor.  Pt taking Miralax but is having regular bowel movements.  Possibly constipation/GI causing pain.  Precautions given. Rx for small amount of Tramadol. Pt has Flexeril .  Keep scheduled appts at [facility].  A/P:  1. Abdominal pain during pregnancy in second trimester   2. [redacted] weeks gestation of pregnancy   3. Constipation during pregnancy, second trimester      D/C home   Olam Boards, CNM 9:39 AM

## 2024-09-29 ENCOUNTER — Ambulatory Visit: Admitting: Student

## 2024-09-29 VITALS — BP 128/86 | HR 104 | Wt 175.0 lb

## 2024-09-29 DIAGNOSIS — K5900 Constipation, unspecified: Secondary | ICD-10-CM

## 2024-09-29 DIAGNOSIS — Z3402 Encounter for supervision of normal first pregnancy, second trimester: Secondary | ICD-10-CM

## 2024-09-29 DIAGNOSIS — O26892 Other specified pregnancy related conditions, second trimester: Secondary | ICD-10-CM

## 2024-09-29 DIAGNOSIS — R109 Unspecified abdominal pain: Secondary | ICD-10-CM

## 2024-09-29 DIAGNOSIS — Z3A20 20 weeks gestation of pregnancy: Secondary | ICD-10-CM | POA: Diagnosis not present

## 2024-09-29 DIAGNOSIS — O99612 Diseases of the digestive system complicating pregnancy, second trimester: Secondary | ICD-10-CM | POA: Diagnosis not present

## 2024-09-29 NOTE — Progress Notes (Signed)
 ROB. Tuesday at noon, had cramping in lower abdomen and vagina. Took flexeril  and tylenol , gas relief. Went to MAU around 2300 that night. Found small fibroid during US . Sent home with tramadol and miralax. Still having right sided abdominal pain 8/10; worsens with walking.

## 2024-09-29 NOTE — Progress Notes (Signed)
 PRENATAL VISIT NOTE  Subjective:  Barbara Rice is a 28 y.o. G1P0000 at [redacted]w[redacted]d being seen today for ongoing prenatal care.  She is currently monitored for the following issues for this low-risk pregnancy and has Inguinal lymphadenopathy; Normocytic anemia; H/O LEEP; Iron  deficiency anemia; and Encounter for supervision of normal first pregnancy in first trimester on their problem list.  Patient reports heartburn and continued abdominal pain. Patient reports reporting to MAU yesterday for severe, 8/10, abdominal pain. Describes it as a sharp pain that radiates to her back. States the right is worse than the left, but occasionally feels the pain on her left side. Has been taking PO iron  every other day as recoemmended by her hematologist for Iron  deficiency anemia. Reports using Miralax 4 times since visit at MAU yesterday. Prior to MAU, was utilizing Miralax 2x/day on the days she took her iron  tablet. Reports that her stools have been loose and not formed in her second trimester. Shares that her constipation was bad during her first trimester. Shares occasional discomfort in pelvic area when sitting on the toilet. Denies pain anywhere else or difficulty with urination.   Contractions: Not present. Vag. Bleeding: None.  Movement: Present. Denies leaking of fluid.   The following portions of the patient's history were reviewed and updated as appropriate: allergies, current medications, past family history, past medical history, past social history, past surgical history and problem list.   Objective:    Vitals:   09/29/24 1055  BP: 128/86  Pulse: (!) 104  Weight: 175 lb (79.4 kg)    Fetal Status:  Fetal Heart Rate (bpm): 154   Movement: Present    General: Alert, oriented and cooperative. Patient is in no acute distress.  Skin: Skin is warm and dry. No rash noted.   Cardiovascular: Normal heart rate noted  Respiratory: Normal respiratory effort, no problems with respiration noted   Abdomen: Soft, gravid, appropriate for gestational age.  Pain/Pressure: Present   Tenderness to palpation in RUQ and Epigastric region , No guarding or rebound tenderness  Pelvic: Cervical exam deferred        Extremities: Normal range of motion.  Edema: None  Mental Status: Normal mood and affect. Normal behavior. Normal judgment and thought content.   Assessment and Plan:  Pregnancy: G1P0000 at [redacted]w[redacted]d 1. Encounter for supervision of normal first pregnancy in second trimester (Primary) - Continue routine care  2. [redacted] weeks gestation of pregnancy - Anatomy scan is scheduled for 10/15  3. Abdominal pain during pregnancy in second trimester - Reassurance provided for unremarkable workup. Symptoms stable. Discussed normal findings on OB US  and pelvic MRI. Discussed the presence of hydronephrosis on MRI, and common cause being pregnancy. Encouraged increased hydration   4. Constipation during pregnancy, second trimester - discussed a bowel regimen.  - patient education provided verbally and in patient chart - Discussed the influence of Iron  on bowel function. Current Iron  levels are WDL. Consider discontinuation of iron  and consulting with her Hematologist  - Recommend trial of OTC laxative other than miralax - Increase fiber intake gradually - increase hydration -precautions provided for returning to MAU - Take PRN pain meds as needed  Preterm labor symptoms and general obstetric precautions including but not limited to vaginal bleeding, contractions, leaking of fluid and fetal movement were reviewed in detail with the patient. Please refer to After Visit Summary for other counseling recommendations.   Return in about 4 weeks (around 10/27/2024) for LOB, IN-PERSON.  Future Appointments  Date Time Provider  Department Center  10/12/2024  8:00 AM [facility] PROVIDER 1 [facility]  10/12/2024  8:30 AM [facility] US1 [facility]  10/27/2024 10:35 AM Nicholaus, Jorene E, PA-C [facility] None   12/06/2024 12:15 PM [facility] None  12/13/2024 11:00 AM Neomi Johnston DASEN, PA-C [facility] None    Nat Dauer, NP

## 2024-10-12 ENCOUNTER — Ambulatory Visit: Attending: Obstetrics and Gynecology

## 2024-10-12 ENCOUNTER — Other Ambulatory Visit: Payer: Self-pay | Admitting: Obstetrics and Gynecology

## 2024-10-12 ENCOUNTER — Other Ambulatory Visit: Payer: Self-pay | Admitting: *Deleted

## 2024-10-12 ENCOUNTER — Ambulatory Visit (HOSPITAL_BASED_OUTPATIENT_CLINIC_OR_DEPARTMENT_OTHER): Admitting: Maternal & Fetal Medicine

## 2024-10-12 VITALS — BP 137/77

## 2024-10-12 DIAGNOSIS — R569 Unspecified convulsions: Secondary | ICD-10-CM

## 2024-10-12 DIAGNOSIS — Z3686 Encounter for antenatal screening for cervical length: Secondary | ICD-10-CM | POA: Insufficient documentation

## 2024-10-12 DIAGNOSIS — Z3A22 22 weeks gestation of pregnancy: Secondary | ICD-10-CM | POA: Diagnosis not present

## 2024-10-12 DIAGNOSIS — O0992 Supervision of high risk pregnancy, unspecified, second trimester: Secondary | ICD-10-CM | POA: Insufficient documentation

## 2024-10-12 DIAGNOSIS — D259 Leiomyoma of uterus, unspecified: Secondary | ICD-10-CM

## 2024-10-12 DIAGNOSIS — O3412 Maternal care for benign tumor of corpus uteri, second trimester: Secondary | ICD-10-CM | POA: Insufficient documentation

## 2024-10-12 DIAGNOSIS — Z363 Encounter for antenatal screening for malformations: Secondary | ICD-10-CM | POA: Diagnosis not present

## 2024-10-12 DIAGNOSIS — O26872 Cervical shortening, second trimester: Secondary | ICD-10-CM

## 2024-10-12 DIAGNOSIS — O358XX0 Maternal care for other (suspected) fetal abnormality and damage, not applicable or unspecified: Secondary | ICD-10-CM

## 2024-10-12 DIAGNOSIS — Z3480 Encounter for supervision of other normal pregnancy, unspecified trimester: Secondary | ICD-10-CM | POA: Insufficient documentation

## 2024-10-12 DIAGNOSIS — Z9889 Other specified postprocedural states: Secondary | ICD-10-CM

## 2024-10-12 DIAGNOSIS — O3410 Maternal care for benign tumor of corpus uteri, unspecified trimester: Secondary | ICD-10-CM | POA: Insufficient documentation

## 2024-10-12 DIAGNOSIS — O2692 Pregnancy related conditions, unspecified, second trimester: Secondary | ICD-10-CM | POA: Diagnosis not present

## 2024-10-12 DIAGNOSIS — O26892 Other specified pregnancy related conditions, second trimester: Secondary | ICD-10-CM

## 2024-10-12 NOTE — Progress Notes (Signed)
 Patient information  Patient Name: Barbara Rice  Patient MRN:   [id number]  Referring practice: MFM Referring Provider: [facility]  Problem List   Patient Active Problem List   Diagnosis Date Noted   Uterine fibroid in pregnancy 10/12/2024   Short cervix during pregnancy in second trimester 10/12/2024   Supervision of high risk pregnancy, antepartum, second trimester 07/19/2024   Iron  deficiency anemia 03/11/2024   H/O LEEP 08/12/2022   History of seizure-like activity (HCC) 07/25/2022   Normocytic anemia 07/25/2022   Inguinal lymphadenopathy 01/26/2022    Maternal Fetal Medicine Consult Barbara Rice is a 28 y.o. G1P0000 at [redacted]w[redacted]d here for ultrasound and consultation. She had low risk aneuploidy screening of a female fetus. Carrier screening was Negative for the basic screening (SMA, alpha-thal, beta-thal, and cystic fibroisis. Maternal serum AFP was negative. She has no acute concerns.   Today we focused on the following:   Short cervix: Today there is a short cervix measuring approximately 2.1 cm.  I discussed this is a risk factor for preterm birth.  Since she has not had a preterm birth before a cerclage is not indicated at this time.  She will start vaginal progesterone, to be prescribed by her OB provider.  I have messaged them to send a prescription in.  She denies pelvic pressure, cramping, bleeding or loss of fluid.  I discussed the importance of monitoring symptoms and going to the MAU or reporting to her doctor if she has any concerns.  I encouraged the patient to avoid heavy lifting, increased abdominal pressure, constipation and intercourse until cleared by physician.  We also discussed pelvic precautions.  She will follow-up in 1 week for transvaginal cervical length.  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  However, having the LEEP does not necessarily confer an  increased risk.  Cervical length was short today.  See above.  Fibroids in pregnancy The ultrasound today shows 2 fibroids 1 anterior and 1 posterior measuring approximately 4 cm with the largest.  I do not think these will be a problem regarding their location during this pregnancy.  The patient has had a few instances of abdominal pain that may be attributable to a fibroid but this spontaneously resolved.. I discussed the clinical significance of these fibroids with the patient.  I discussed that the risks of large fibroids in pregnancy are rapid and large growth, significant pain requiring pain medication and sometimes admission to the hospital, placental abruption, placental insufficiency due to sequestration of blood supply away from the placenta to the fibroids, preterm birth and need for cesarean delivery.  I discussed the plan of continuing to follow her with growth ultrasounds due to the risk of growth restriction.  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  She has not had any concerns since that time and the EEG was normal.  An MRI of the brain was also normal.  Sonographic findings Single intrauterine pregnancy at 22w 5d  Fetal cardiac activity:  Observed and appears normal. Presentation: Breech. The anatomic structures that were well seen appear normal without evidence of soft markers. Due to poor acoustic windows some structures remain suboptimally visualized. Fetal biometry shows the  estimated fetal weight at the 75 percentile.  Amniotic fluid: Within normal limits.  MVP: 5.12 cm. Placenta: Posterior. Adnexa: No abnormality visualized. Cervical length: 2.1 cm.  There are limitations of prenatal ultrasound such as the inability to detect certain  abnormalities due to poor visualization. Various factors such as fetal position, gestational age and maternal body habitus may increase the difficulty in visualizing the fetal anatomy.    Recommendations - EDD should be 02/10/2025 based on  Early Ultrasound  (07/16/24) due to irregular cycles not 28 days apart.  - Aneuploidy screening was completed at the [facility] office - Vaginal progesterone to start as soon as possible.  OB provider notified to send in a prescription. - Repeat transvaginal cervical length in 1 week.  - Growth ultrasound in 1 month. - Monitor symptoms/signs of preterm birth.  The patient should go to the MAU or report I to her doctor if she has any concerns.  I encouraged the patient to avoid heavy lifting, increased abdominal pressure, constipation and intercourse until cleared by physician.  We also discussed pelvic precautions.  She will follow-up in 1 week for transvaginal cervical length.  Review of Systems: A review of systems was performed and was negative except per HPI   Past Obstetrical History:  OB History  Gravida Para Term Preterm AB Living  1 0 0 0 0 0  SAB IAB Ectopic Multiple Live Births  0 0 0 0 0    # Outcome Date GA Lbr Len/2nd Weight Sex Type Anes PTL Lv  1 Current              Past Medical History:  Past Medical History:  Diagnosis Date   Anemia    Sciatic nerve pain    Seizure disorder during pregnancy, antepartum (HCC)    Pt reports hx of seizures since 2023. Describes falling back from sittting, jerking movements, uncontrolled tears. Denies urinary incontinence. Reports fatigue afterwards.     Past Surgical History:    Past Surgical History:  Procedure Laterality Date   LEEP     NO PAST SURGERIES       Home Medications:   Current Outpatient Medications on File Prior to Visit  Medication Sig Dispense Refill   acetaminophen  (TYLENOL ) 500 MG tablet Take 2 tablets (1,000 mg total) by mouth every 6 (six) hours as needed for moderate pain (pain  score 4-6) or mild pain (pain score 1-3).     aspirin  81 MG chewable tablet Chew 1 tablet (81 mg total) by mouth daily. 30 tablet 8   Blood Pressure Monitoring (BLOOD PRESSURE KIT) DEVI 1 Device by Does not apply route once a week. 1 each 0   cyclobenzaprine  (FLEXERIL ) 10 MG tablet Take 1 tablet (10 mg total) by mouth 2 (two) times daily as needed for muscle spasms. 20 tablet 0   Doxylamine -Pyridoxine  10-10 MG TBEC Take 2 tablets by mouth at bedtime. May also take 1 tab in am and 1 tab in afternoon 100 tablet 1   famotidine  (PEPCID ) 20 MG tablet Take 1 tablet (20 mg total) by mouth 2 (two) times daily. 60 tablet 0   ferrous sulfate  325 (65 FE) MG EC tablet Take 1 tablet (325 mg total) by mouth daily with breakfast. 30 tablet 6   Polyethylene Glycol 3350 (MIRALAX PO) Take by mouth.     polyethylene glycol powder (GLYCOLAX/MIRALAX) 17 GM/SCOOP powder Take 17 g by mouth daily. 255 g 0   Prenatal Vit-Fe Fumarate-FA (PRENATAL VITAMINS PO) Take by mouth.  Simethicone  80 MG TABS Take 2 tablets (160 mg total) by mouth 4 (four) times daily as needed (gas pain). 60 tablet 1   traMADol (ULTRAM) 50 MG tablet Take 1-2 tablets (50-100 mg total) by mouth every 6 (six) hours as needed. 15 tablet 0   Cholecalciferol (VITAMIN D-3 PO) Take by mouth. (Patient not taking: Reported on 10/12/2024)     No current facility-administered medications on file prior to visit.      Allergies:   Allergies  Allergen Reactions   Porcine (Pork) Protein-Containing Drug Products Other (See Comments)    Religion-based; does not consume pork-products     Physical Exam:   Vitals:   10/12/24 0841  BP: 137/77   Sitting comfortably on the sonogram table Nonlabored breathing Normal rate and rhythm Abdomen is nontender  Thank you for the opportunity to be involved with this patient's care. Please let us  know if we can be of any further assistance.   60 minutes of time was spent reviewing the patient's chart including  labs, imaging and documentation.  At least 50% of this time was spent with direct patient care discussing the diagnosis, management and prognosis of her care.  Delora Smaller MFM, [facility]   10/12/2024  10:35 AM

## 2024-10-13 ENCOUNTER — Other Ambulatory Visit: Payer: Self-pay | Admitting: Physician Assistant

## 2024-10-13 DIAGNOSIS — O26879 Cervical shortening, unspecified trimester: Secondary | ICD-10-CM

## 2024-10-13 MED ORDER — PROGESTERONE 200 MG PO CAPS: ORAL_CAPSULE | ORAL | 2 refills | Status: AC

## 2024-10-18 ENCOUNTER — Ambulatory Visit: Attending: Maternal & Fetal Medicine | Admitting: Maternal & Fetal Medicine

## 2024-10-18 ENCOUNTER — Ambulatory Visit (HOSPITAL_BASED_OUTPATIENT_CLINIC_OR_DEPARTMENT_OTHER)

## 2024-10-18 ENCOUNTER — Other Ambulatory Visit

## 2024-10-18 VITALS — BP 129/80

## 2024-10-18 DIAGNOSIS — O358XX0 Maternal care for other (suspected) fetal abnormality and damage, not applicable or unspecified: Secondary | ICD-10-CM

## 2024-10-18 DIAGNOSIS — Z3689 Encounter for other specified antenatal screening: Secondary | ICD-10-CM | POA: Diagnosis present

## 2024-10-18 DIAGNOSIS — O26872 Cervical shortening, second trimester: Secondary | ICD-10-CM | POA: Diagnosis not present

## 2024-10-18 DIAGNOSIS — O0992 Supervision of high risk pregnancy, unspecified, second trimester: Secondary | ICD-10-CM | POA: Diagnosis not present

## 2024-10-18 DIAGNOSIS — Z3A23 23 weeks gestation of pregnancy: Secondary | ICD-10-CM | POA: Insufficient documentation

## 2024-10-18 NOTE — Progress Notes (Signed)
   Patient information  Patient Name: Barbara Rice  Patient MRN:   [id number]  Referring practice: MFM Referring Provider: [facility]  Problem List   Patient Active Problem List   Diagnosis Date Noted   Uterine fibroid in pregnancy 10/12/2024   Short cervix during pregnancy in second trimester 10/12/2024   Supervision of high risk pregnancy, antepartum, second trimester 07/19/2024   Iron  deficiency anemia 03/11/2024   H/O LEEP 08/12/2022   History of seizure-like activity (HCC) 07/25/2022   Normocytic anemia 07/25/2022   Inguinal lymphadenopathy 01/26/2022   Maternal Fetal medicine Consult  Tameka Jesus is a 28 y.o. G1P0000 at [redacted]w[redacted]d here for ultrasound and consultation. Glorianne Lundquist is doing well today with no acute concerns. Today we focused on the following:   Short cervix: Compliant with progesterone. Prior CL was 2.1 cm. Cervix measures 2.7 cm. Once again I reviewed the importance of pelvic precautions and the need to avoid anything intravaginal until about 28 weeks.  I also gave her precautions on not lifting over 20 pounds or anything that requires Valsalva.  She should also avoid prolonged standing.  The patient had time to ask questions that were answered to her satisfaction.  She verbalized understanding and agrees to proceed with the plan below.  Sonographic findings Single intrauterine pregnancy at 23w 4d  Fetal cardiac activity:  Observed and appears normal. Presentation: Cephalic. The anatomic structures that were well seen appear normal without evidence of soft markers. Due to poor acoustic windows some structures remain suboptimally visualized. Fetal biometry shows the estimated fetal weight at the  percentile.  Amniotic fluid: Within normal limits.  MVP:  cm. Placenta: Posterior. Adnexa: No abnormality visualized. Cervical length: 2.7 cm.  There are limitations of prenatal ultrasound such as the inability to detect certain abnormalities due to  poor visualization. Various factors such as fetal position, gestational age and maternal body habitus may increase the difficulty in visualizing the fetal anatomy.    Recommendations - Continue vaginal progesterone - Pelvic and lifting precautions given - Follow-up for growth ultrasound as scheduled  Review of Systems: A review of systems was performed and was negative except per HPI   Vitals and Physical Exam    10/18/2024    1:45 PM 10/12/2024    8:41 AM 09/29/2024   10:55 AM  Vitals with BMI  Weight   175 lbs  BMI   28.26  Systolic 129 137 871  Diastolic 80 77 86  Pulse   104    Sitting comfortably on the sonogram table Nonlabored breathing Normal rate and rhythm Abdomen is nontender  Past pregnancies OB History  Gravida Para Term Preterm AB Living  1 0 0 0 0 0  SAB IAB Ectopic Multiple Live Births  0 0 0 0 0    # Outcome Date GA Lbr Len/2nd Weight Sex Type Anes PTL Lv  1 Current              I spent 30 minutes reviewing the patients chart, including labs and images as well as counseling the patient about her medical conditions. Greater than 50% of the time was spent in direct face-to-face patient counseling.  Delora Smaller  MFM, [place]   10/18/2024  2:25 PM

## 2024-10-27 ENCOUNTER — Ambulatory Visit: Admitting: Physician Assistant

## 2024-10-27 ENCOUNTER — Encounter: Payer: Self-pay | Admitting: Physician Assistant

## 2024-10-27 VITALS — BP 131/82 | HR 83 | Wt 181.6 lb

## 2024-10-27 DIAGNOSIS — D509 Iron deficiency anemia, unspecified: Secondary | ICD-10-CM

## 2024-10-27 DIAGNOSIS — O0992 Supervision of high risk pregnancy, unspecified, second trimester: Secondary | ICD-10-CM

## 2024-10-27 DIAGNOSIS — O26872 Cervical shortening, second trimester: Secondary | ICD-10-CM | POA: Diagnosis not present

## 2024-10-27 DIAGNOSIS — O3410 Maternal care for benign tumor of corpus uteri, unspecified trimester: Secondary | ICD-10-CM

## 2024-10-27 DIAGNOSIS — Z3A24 24 weeks gestation of pregnancy: Secondary | ICD-10-CM

## 2024-10-27 DIAGNOSIS — D259 Leiomyoma of uterus, unspecified: Secondary | ICD-10-CM

## 2024-10-27 NOTE — Progress Notes (Signed)
   PRENATAL VISIT NOTE  Subjective:  Barbara Rice is a 28 y.o. G1P0000 at [redacted]w[redacted]d being seen today for ongoing prenatal care.  She is currently monitored for the following issues for this high-risk pregnancy and has Inguinal lymphadenopathy; History of seizure-like activity (HCC); Normocytic anemia; H/O LEEP; Iron  deficiency anemia; Supervision of high risk pregnancy, antepartum, second trimester; Uterine fibroid in pregnancy; and Short cervix during pregnancy in second trimester on their problem list.  Patient reports concerns re: short cervix and uterine fibroids. She does have intermittent bulky discomfort from fibroids; she is not taking any medication.   Contractions: Not present. Vag. Bleeding: None.  Movement: Present. Denies leaking of fluid.   The following portions of the patient's history were reviewed and updated as appropriate: allergies, current medications, past family history, past medical history, past social history, past surgical history and problem list.   Objective:    Vitals:   10/27/24 1051  BP: 131/82  Pulse: 83  Weight: 181 lb 9.6 oz (82.4 kg)    Fetal Status:  Fetal Heart Rate (bpm): 143 Fundal Height: 25 cm Movement: Present    General: Alert, oriented and cooperative. Patient is in no acute distress.  Skin: Skin is warm and dry. No rash noted.   Cardiovascular: Normal heart rate noted  Respiratory: Normal respiratory effort, no problems with respiration noted  Abdomen: Soft, gravid, appropriate for gestational age.  Pain/Pressure: Present     Pelvic: Cervical exam deferred        Extremities: Normal range of motion.  Edema: Trace  Mental Status: Normal mood and affect. Normal behavior. Normal judgment and thought content.   Assessment and Plan:  Pregnancy: G1P0000 at [redacted]w[redacted]d  1. Supervision of high risk pregnancy, antepartum, second trimester (Primary) Patient doing well, feeling regular fetal movement BP, FHR, FH appropriate   2. [redacted] weeks gestation  of pregnancy Anticipatory guidance about next visits/weeks of pregnancy given.   3. Short cervix during pregnancy in second trimester On prometrium 200 mg at bedtime  10/18/24 CL now 2.7 cm Reviewed lifting restrictions, avoid prolonged standing, no lifting> 20 lbs, no activity that requires valsalva.  F/U MFM 11/08/24   4. Uterine fibroid in pregnancy Stable  5. Iron  deficiency anemia, unspecified iron  deficiency anemia type 10/01 hgb 12.1  Preterm labor symptoms and general obstetric precautions including but not limited to vaginal bleeding, contractions, leaking of fluid and fetal movement were reviewed in detail with the patient.  Please refer to After Visit Summary for other counseling recommendations.   No follow-ups on file.  Future Appointments  Date Time Provider Department Center  11/08/2024  2:00 PM [facility] PROVIDER 1 [facility]  11/08/2024  2:15 PM [facility] US2 [facility]  11/22/2024  8:00 AM [facility] None  11/22/2024  9:35 AM Eveline Lynwood MATSU, MD [facility] None  12/06/2024 12:15 PM [facility] None  12/13/2024 11:00 AM Neomi Johnston DASEN, PA-C [facility] None    Jorene FORBES Moats, PA-C

## 2024-10-27 NOTE — Progress Notes (Deleted)
 the importance of pelvic precautions and the need to avoid anything intravaginal until about 28 weeks. I also gave her precautions on not lifting over 20 pounds or anything that requires Valsalva. She should also avoid prolonged standing. Was discussed with pt during 10/18/24/ visit due to short cervix.

## 2024-10-27 NOTE — Progress Notes (Signed)
 Pt presents for ROB visit. Pt has concerns about short cervix and fibroids for US  10-15 and 10-21

## 2024-11-08 ENCOUNTER — Other Ambulatory Visit: Payer: Self-pay | Admitting: *Deleted

## 2024-11-08 ENCOUNTER — Ambulatory Visit (HOSPITAL_BASED_OUTPATIENT_CLINIC_OR_DEPARTMENT_OTHER): Admitting: Obstetrics

## 2024-11-08 ENCOUNTER — Ambulatory Visit: Attending: Obstetrics and Gynecology

## 2024-11-08 VITALS — BP 131/79

## 2024-11-08 DIAGNOSIS — O358XX0 Maternal care for other (suspected) fetal abnormality and damage, not applicable or unspecified: Secondary | ICD-10-CM | POA: Diagnosis present

## 2024-11-08 DIAGNOSIS — D259 Leiomyoma of uterus, unspecified: Secondary | ICD-10-CM | POA: Diagnosis not present

## 2024-11-08 DIAGNOSIS — O3442 Maternal care for other abnormalities of cervix, second trimester: Secondary | ICD-10-CM

## 2024-11-08 DIAGNOSIS — O26872 Cervical shortening, second trimester: Secondary | ICD-10-CM | POA: Insufficient documentation

## 2024-11-08 DIAGNOSIS — R569 Unspecified convulsions: Secondary | ICD-10-CM

## 2024-11-08 DIAGNOSIS — Z3A26 26 weeks gestation of pregnancy: Secondary | ICD-10-CM | POA: Diagnosis not present

## 2024-11-08 DIAGNOSIS — O3412 Maternal care for benign tumor of corpus uteri, second trimester: Secondary | ICD-10-CM | POA: Insufficient documentation

## 2024-11-08 DIAGNOSIS — O26892 Other specified pregnancy related conditions, second trimester: Secondary | ICD-10-CM | POA: Diagnosis not present

## 2024-11-08 NOTE — Progress Notes (Signed)
 MFM Consult Note  Barbara Rice is currently at [redacted]w[redacted]d. She has been followed due to a fibroid uterus and a shortened cervix noted on her prior exams..  Sonographic findings Single intrauterine pregnancy at 26w 4d.  Fetal cardiac activity:  Observed and appears normal. Presentation: Cephalic. Fetal biometry shows the estimated fetal weight of 2 pounds 2 ounces which measures at the 39th percentile. Amniotic fluid volume: Within normal limits. MVP: 5.92 cm. Placenta: Posterior. Multiple fibroids continue to be noted throughout her uterus.  The fibroids appear stable in size. A shortened cervix measuring 1.9 cm long with funneling continues to be noted today.  Shortened cervix and fibroid uterus She was advised to continue using daily vaginal progesterone to decrease her risk of a preterm birth.    Preterm labor precautions were reviewed today.  Due to her fibroid uterus, we will continue to follow her with growth ultrasounds throughout her pregnancy.  A follow up exam was scheduled in 4 weeks.   The patient stated that all of her questions were answered.   A total of 20 minutes was spent counseling and coordinating the care for this patient.  Greater than 50% of the time was spent in direct face-to-face contact.

## 2024-11-18 ENCOUNTER — Encounter: Payer: Self-pay | Admitting: Oncology

## 2024-11-22 ENCOUNTER — Other Ambulatory Visit

## 2024-11-22 ENCOUNTER — Ambulatory Visit: Payer: Self-pay | Admitting: Obstetrics & Gynecology

## 2024-11-22 VITALS — BP 132/83 | HR 123 | Temp 98.3°F | Wt 185.0 lb

## 2024-11-22 DIAGNOSIS — O0992 Supervision of high risk pregnancy, unspecified, second trimester: Secondary | ICD-10-CM

## 2024-11-22 DIAGNOSIS — Z3A28 28 weeks gestation of pregnancy: Secondary | ICD-10-CM

## 2024-11-22 DIAGNOSIS — O26872 Cervical shortening, second trimester: Secondary | ICD-10-CM

## 2024-11-22 NOTE — Progress Notes (Signed)
 PRENATAL VISIT NOTE  Subjective:  Barbara Rice is a 28 y.o. G1P0000 at [redacted]w[redacted]d being seen today for ongoing prenatal care.  She is currently monitored for the following issues for this high-risk pregnancy and has Inguinal lymphadenopathy; History of seizure-like activity (HCC); Normocytic anemia; H/O LEEP; Iron  deficiency anemia; Supervision of high risk pregnancy, antepartum, second trimester; Uterine fibroid in pregnancy; and Short cervix during pregnancy in second trimester on their problem list.  Patient reports no complaints.  Contractions: Irritability. Vag. Bleeding: None.  Movement: Present. Denies leaking of fluid.   The following portions of the patient's history were reviewed and updated as appropriate: allergies, current medications, past family history, past medical history, past social history, past surgical history and problem list.   Objective:   Vitals:   11/22/24 0908  BP: 132/83  Pulse: (!) 123  Temp: 98.3 F (36.8 C)  Weight: 185 lb (83.9 kg)    Fetal Status:  Fetal Heart Rate (bpm): 154   Movement: Present    General: Alert, oriented and cooperative. Patient is in no acute distress.  Skin: Skin is warm and dry. No rash noted.   Cardiovascular: Normal heart rate noted  Respiratory: Normal respiratory effort, no problems with respiration noted  Abdomen: Soft, gravid, appropriate for gestational age.  Pain/Pressure: Present     Pelvic: Cervical exam deferred        Extremities: Normal range of motion.  Edema: Trace  Mental Status: Normal mood and affect. Normal behavior. Normal judgment and thought content.      11/22/2024    9:14 AM 07/19/2024   11:13 AM 03/30/2024    2:45 PM  Depression screen PHQ 2/9  Decreased Interest 0 0 0  Down, Depressed, Hopeless 0 0 0  PHQ - 2 Score 0 0 0  Altered sleeping 0 0   Tired, decreased energy 0 0   Change in appetite 0 0   Feeling bad or failure about yourself  0 0   Trouble concentrating 0 0   Moving slowly or  fidgety/restless 0 0   Suicidal thoughts 0 0   PHQ-9 Score 0 0    Difficult doing work/chores Not difficult at all       Data saved with a previous flowsheet row definition        11/22/2024    9:15 AM 07/19/2024   11:13 AM 09/01/2023   10:37 AM 08/15/2022   11:23 AM  GAD 7 : Generalized Anxiety Score  Nervous, Anxious, on Edge 0 0 1 1  Control/stop worrying 0 0 1 0  Worry too much - different things 0 0 1 1  Trouble relaxing 0 0 0 0  Restless 0 0 0 1  Easily annoyed or irritable 0 0 0 0  Afraid - awful might happen 0 0 0 0  Total GAD 7 Score 0 0 3 3  Anxiety Difficulty Not difficult at all  Not difficult at all Somewhat difficult    Assessment and Plan:  Pregnancy: G1P0000 at [redacted]w[redacted]d 1. Supervision of high risk pregnancy, antepartum, second trimester (Primary)  - Glucose Tolerance, 2 Hours w/1 Hour - RPR - HIV antibody (with reflex) - CBC  2. [redacted] weeks gestation of pregnancy  - Glucose Tolerance, 2 Hours w/1 Hour - RPR - HIV antibody (with reflex) - CBC  3. Short cervix during pregnancy in second trimester Prometrium  Rx  Preterm labor symptoms and general obstetric precautions including but not limited to vaginal bleeding, contractions, leaking of fluid and fetal  movement were reviewed in detail with the patient. Please refer to After Visit Summary for other counseling recommendations.   Return in about 3 weeks (around 12/13/2024).  Future Appointments  Date Time Provider Department Center  11/22/2024  9:35 AM Eveline Lynwood MATSU, MD [facility] None  12/06/2024 12:15 PM [facility] None  12/13/2024 11:00 AM Neomi Johnston DASEN, PA-C [facility] None    Lynwood Eveline, MD

## 2024-11-22 NOTE — Progress Notes (Signed)
 ROB/GTT. Pt wants to discuss [facility] options.  Declined FLU and TDAP vaccines.

## 2024-11-23 LAB — SYPHILIS: RPR W/REFLEX TO RPR TITER AND TREPONEMAL ANTIBODIES, TRADITIONAL SCREENING AND DIAGNOSIS ALGORITHM: RPR Ser Ql: NONREACTIVE

## 2024-11-23 LAB — CBC
Hematocrit: 33.4 % — ABNORMAL LOW (ref 34.0–46.6)
Hemoglobin: 10.7 g/dL — ABNORMAL LOW (ref 11.1–15.9)
MCH: 30 pg (ref 26.6–33.0)
MCHC: 32 g/dL (ref 31.5–35.7)
MCV: 94 fL (ref 79–97)
Platelets: 202 x10E3/uL (ref 150–450)
RBC: 3.57 x10E6/uL — ABNORMAL LOW (ref 3.77–5.28)
RDW: 12.1 % (ref 11.7–15.4)
WBC: 4.5 x10E3/uL (ref 3.4–10.8)

## 2024-11-23 LAB — GLUCOSE TOLERANCE, 2 HOURS W/ 1HR
Glucose, 1 hour: 172 mg/dL (ref 70–179)
Glucose, 2 hour: 139 mg/dL (ref 70–152)
Glucose, Fasting: 88 mg/dL (ref 70–91)

## 2024-11-23 LAB — HIV ANTIBODY (ROUTINE TESTING W REFLEX): HIV Screen 4th Generation wRfx: NONREACTIVE

## 2024-12-05 ENCOUNTER — Other Ambulatory Visit: Payer: Self-pay

## 2024-12-05 DIAGNOSIS — D500 Iron deficiency anemia secondary to blood loss (chronic): Secondary | ICD-10-CM

## 2024-12-06 ENCOUNTER — Inpatient Hospital Stay: Attending: Hematology and Oncology

## 2024-12-06 ENCOUNTER — Ambulatory Visit (HOSPITAL_BASED_OUTPATIENT_CLINIC_OR_DEPARTMENT_OTHER): Admitting: Obstetrics and Gynecology

## 2024-12-06 ENCOUNTER — Ambulatory Visit: Attending: Obstetrics and Gynecology

## 2024-12-06 VITALS — BP 120/75

## 2024-12-06 DIAGNOSIS — D259 Leiomyoma of uterus, unspecified: Secondary | ICD-10-CM

## 2024-12-06 DIAGNOSIS — Z3A31 31 weeks gestation of pregnancy: Secondary | ICD-10-CM | POA: Diagnosis present

## 2024-12-06 DIAGNOSIS — O26872 Cervical shortening, second trimester: Secondary | ICD-10-CM | POA: Diagnosis not present

## 2024-12-06 DIAGNOSIS — D509 Iron deficiency anemia, unspecified: Secondary | ICD-10-CM | POA: Diagnosis present

## 2024-12-06 DIAGNOSIS — O26879 Cervical shortening, unspecified trimester: Secondary | ICD-10-CM | POA: Insufficient documentation

## 2024-12-06 DIAGNOSIS — Z3A30 30 weeks gestation of pregnancy: Secondary | ICD-10-CM

## 2024-12-06 DIAGNOSIS — O3412 Maternal care for benign tumor of corpus uteri, second trimester: Secondary | ICD-10-CM | POA: Diagnosis not present

## 2024-12-06 DIAGNOSIS — O358XX0 Maternal care for other (suspected) fetal abnormality and damage, not applicable or unspecified: Secondary | ICD-10-CM | POA: Diagnosis not present

## 2024-12-06 DIAGNOSIS — O99013 Anemia complicating pregnancy, third trimester: Secondary | ICD-10-CM | POA: Diagnosis present

## 2024-12-06 DIAGNOSIS — O26873 Cervical shortening, third trimester: Secondary | ICD-10-CM | POA: Diagnosis not present

## 2024-12-06 DIAGNOSIS — O3413 Maternal care for benign tumor of corpus uteri, third trimester: Secondary | ICD-10-CM | POA: Diagnosis not present

## 2024-12-06 DIAGNOSIS — D500 Iron deficiency anemia secondary to blood loss (chronic): Secondary | ICD-10-CM

## 2024-12-06 LAB — CMP (CANCER CENTER ONLY)
ALT: 18 U/L (ref 0–44)
AST: 25 U/L (ref 15–41)
Albumin: 3.7 g/dL (ref 3.5–5.0)
Alkaline Phosphatase: 66 U/L (ref 38–126)
Anion gap: 9 (ref 5–15)
BUN: <5 mg/dL — ABNORMAL LOW (ref 6–20)
CO2: 23 mmol/L (ref 22–32)
Calcium: 9.3 mg/dL (ref 8.9–10.3)
Chloride: 103 mmol/L (ref 98–111)
Creatinine: 0.55 mg/dL (ref 0.44–1.00)
GFR, Estimated: >60 mL/min (ref >60)
Glucose, Bld: 134 mg/dL — ABNORMAL HIGH (ref 70–99)
Potassium: 3.9 mmol/L (ref 3.5–5.1)
Sodium: 135 mmol/L (ref 135–145)
Total Bilirubin: 0.3 mg/dL (ref 0.0–1.2)
Total Protein: 6.6 g/dL (ref 6.5–8.1)

## 2024-12-06 LAB — IRON AND IRON BINDING CAPACITY (CC-WL,HP ONLY)
Iron: 59 ug/dL (ref 28–170)
Saturation Ratios: 11 % (ref 10.4–31.8)
TIBC: 531 ug/dL — ABNORMAL HIGH (ref 250–450)
UIBC: 472 ug/dL

## 2024-12-06 LAB — CBC WITH DIFFERENTIAL (CANCER CENTER ONLY)
Abs Immature Granulocytes: 0.1 K/uL — ABNORMAL HIGH (ref 0.00–0.07)
Basophils Absolute: 0 K/uL (ref 0.0–0.1)
Basophils Relative: 0 %
Eosinophils Absolute: 0 K/uL (ref 0.0–0.5)
Eosinophils Relative: 1 %
HCT: 30.9 % — ABNORMAL LOW (ref 36.0–46.0)
Hemoglobin: 10.2 g/dL — ABNORMAL LOW (ref 12.0–15.0)
Immature Granulocytes: 2 %
Lymphocytes Relative: 21 %
Lymphs Abs: 0.9 K/uL (ref 0.7–4.0)
MCH: 29.7 pg (ref 26.0–34.0)
MCHC: 33 g/dL (ref 30.0–36.0)
MCV: 89.8 fL (ref 80.0–100.0)
Monocytes Absolute: 0.2 K/uL (ref 0.1–1.0)
Monocytes Relative: 5 %
Neutro Abs: 3.1 K/uL (ref 1.7–7.7)
Neutrophils Relative %: 71 %
Platelet Count: 189 K/uL (ref 150–400)
RBC: 3.44 MIL/uL — ABNORMAL LOW (ref 3.87–5.11)
RDW: 12.8 % (ref 11.5–15.5)
WBC Count: 4.3 K/uL (ref 4.0–10.5)
nRBC: 0 % (ref 0.0–0.2)

## 2024-12-06 LAB — RETIC PANEL
Immature Retic Fract: 26.9 % — ABNORMAL HIGH (ref 2.3–15.9)
RBC.: 3.35 MIL/uL — ABNORMAL LOW (ref 3.87–5.11)
Retic Count, Absolute: 74.4 K/uL (ref 19.0–186.0)
Retic Ct Pct: 2.2 % (ref 0.4–3.1)
Reticulocyte Hemoglobin: 29.8 pg (ref >27.9)

## 2024-12-06 LAB — FERRITIN: Ferritin: 12 ng/mL (ref 11–307)

## 2024-12-06 NOTE — Progress Notes (Signed)
 Maternal-Fetal Medicine Consultation  Name: Barbara Rice  MRN: [id number]  GA: G1P0000 [redacted]w[redacted]d   Patient is here for fetal growth assessment.  She had cervical insufficiency and takes vaginal progesterone  treatment.  Past surgical history significant for LEEP. She does not have gestational diabetes.  Blood pressure today at our office is 140/70 mmHg.  Ultrasound Normal fetal growth and amniotic fluid.  Cephalic presentation.  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  I reassured the patient on the findings.  She does not have hypertension or diabetes no further follow-up is recommended.  I encouraged her to continue vaginal progesterone  until [redacted] weeks gestation.  Regardless of the progesterone  treatment, the risk of preterm delivery is slightly higher with cervical insufficiency detected at midtrimester ultrasound.  Recommendations Follow-up scans as clinically indicated.     Consultation including face-to-face (more than 50%) counseling 20 minutes.

## 2024-12-12 ENCOUNTER — Encounter: Admitting: Obstetrics and Gynecology

## 2024-12-12 NOTE — Progress Notes (Unsigned)
 [facility] [phone]   [phone]  PROGRESS NOTE:  Patient Care Team: Cleotilde, Virginia  E, PA as PCP - General (Internal Medicine)  CHIEF COMPLAINTS/PURPOSE OF CONSULTATION:  Leukopenia/neutropenia. Iron  deficiency anemia Iron  Deficiency Anemia in pregnancy   HISTORY OF PRESENTING ILLNESS:  Barbara Rice 28 y.o. female returns to the clinic for a follow up for evaluation of leukopenia and iron  deficiency anemia. She is unaccompanied for this visit.   On exam today, Barbara Rice reports that she is currently [redacted] weeks pregnant.  Her due date is February 10, 2025 and she will be giving birth to a baby boy. She is experiencing worsening fatigue which impacts her ADLs. She is no longer taking iron  pills due to constipation. She currently takes miralax  as needed for constipation. She has occasional shortness of breath and dizziness. In addition, she has started to crave ice. She denies fevers, chills, sweats, chest pain or cough. She has no other complaints. Rest of the ROS is below.   MEDICAL HISTORY:  Past Medical History:  Diagnosis Date   Anemia    Sciatic nerve pain    Seizure disorder during pregnancy, antepartum (HCC)    Pt reports hx of seizures since 2023. Describes falling back from sittting, jerking movements, uncontrolled tears. Denies urinary incontinence. Reports fatigue afterwards.    SURGICAL HISTORY: Past Surgical History:  Procedure Laterality Date   LEEP     NO PAST SURGERIES      SOCIAL HISTORY: Social History   Socioeconomic History   Marital status: Married    Spouse name: Not on file   Number of children: Not on file   Years of education: Not on file   Highest education level: Not on file  Occupational History   Not on file  Tobacco Use   Smoking status: Never   Smokeless tobacco: Never  Vaping Use   Vaping status: Never Used  Substance and Sexual Activity   Alcohol use: Never   Drug use: Never   Sexual  activity: Yes    Birth control/protection: None  Other Topics Concern   Not on file  Social History Narrative   Not on file   Social Drivers of Health   Tobacco Use: Low Risk (12/13/2024)   Patient History    Smoking Tobacco Use: Never    Smokeless Tobacco Use: Never    Passive Exposure: Not on file  Financial Resource Strain: Not on file  Food Insecurity: Not on file  Transportation Needs: Not on file  Physical Activity: Not on file  Stress: Not on file  Social Connections: Unknown (05/13/2022)   [facility]   Social Network    Social Network: Not on file  Intimate Partner Violence: Unknown (04/04/2022)   Received from [facility]   HITS    Physically Hurt: Not on file    Insult or Talk Down To: Not on file    Threaten Physical Harm: Not on file    Scream or Curse: Not on file  Depression (PHQ2-9): Low Risk (11/22/2024)   Depression (PHQ2-9)    PHQ-2 Score: 0  Alcohol Screen: Low Risk (12/10/2021)   Alcohol Screen    Last Alcohol Screening Score (AUDIT): 0  Housing: Not on file  Utilities: Not on file  Health Literacy: Not on file    FAMILY HISTORY: Family History  Problem Relation Age of Onset   Healthy Mother    Cancer Father        prostate  Hypertension Father    Cervical cancer Paternal Aunt    Ovarian cancer Paternal Aunt    Cancer Cousin     ALLERGIES:  is allergic to porcine (pork) protein-containing drug products.  MEDICATIONS:  Current Outpatient Medications  Medication Sig Dispense Refill   acetaminophen  (TYLENOL ) 500 MG tablet Take 2 tablets (1,000 mg total) by mouth every 6 (six) hours as needed for moderate pain (pain score 4-6) or mild pain (pain score 1-3).     Blood Pressure Monitoring (BLOOD PRESSURE KIT) DEVI 1 Device by Does not apply route once a week. 1 each 0   cyclobenzaprine  (FLEXERIL ) 10 MG tablet Take 1 tablet (10 mg total) by mouth 2 (two) times daily as needed for muscle spasms. 20 tablet 0    Doxylamine -Pyridoxine  10-10 MG TBEC Take 2 tablets by mouth at bedtime. May also take 1 tab in am and 1 tab in afternoon 100 tablet 1   famotidine  (PEPCID ) 20 MG tablet Take 1 tablet (20 mg total) by mouth 2 (two) times daily. 60 tablet 0   Polyethylene Glycol 3350  (MIRALAX  PO) Take by mouth.     polyethylene glycol powder (GLYCOLAX /MIRALAX ) 17 GM/SCOOP powder Take 17 g by mouth daily. 255 g 0   Prenatal Vit-Fe Fumarate-FA (PRENATAL VITAMINS PO) Take by mouth.     progesterone  (PROMETRIUM ) 200 MG capsule Place one capsule vaginally at bedtime 60 capsule 2   Simethicone  80 MG TABS Take 2 tablets (160 mg total) by mouth 4 (four) times daily as needed (gas pain). 60 tablet 1   aspirin  81 MG chewable tablet Chew 1 tablet (81 mg total) by mouth daily. (Patient not taking: Reported on 12/13/2024) 30 tablet 8   Cholecalciferol (VITAMIN D-3 PO) Take by mouth. (Patient not taking: Reported on 12/13/2024)     ferrous sulfate  325 (65 FE) MG EC tablet Take 1 tablet (325 mg total) by mouth daily with breakfast. (Patient not taking: Reported on 12/13/2024) 30 tablet 6   No current facility-administered medications for this visit.    REVIEW OF SYSTEMS:   Constitutional: ( - ) fevers, ( - )  chills , ( - ) night sweats Eyes: ( - ) blurriness of vision, ( - ) double vision, ( - ) watery eyes Ears, nose, mouth, throat, and face: ( - ) mucositis, ( - ) sore throat Respiratory: ( - ) cough, ( - ) dyspnea, ( - ) wheezes Cardiovascular: ( - ) palpitation, ( - ) chest discomfort, ( - ) lower extremity swelling Gastrointestinal:  ( - ) nausea, ( - ) heartburn, ( + ) change in bowel habits Skin: ( - ) abnormal skin rashes Lymphatics: ( - ) new lymphadenopathy, ( - ) easy bruising Neurological: ( - ) numbness, ( - ) tingling, ( - ) new weaknesses Behavioral/Psych: ( - ) mood change, ( - ) new changes  All other systems were reviewed with the patient and are negative.  PHYSICAL EXAMINATION: ECOG PERFORMANCE STATUS:  1 - Symptomatic but completely ambulatory  Vitals:   12/13/24 1132  BP: 131/83  Pulse: 100  Resp: 16  Temp: 97.7 F (36.5 C)  SpO2: 100%      Filed Weights   12/13/24 1132  Weight: 191 lb (86.6 kg)       GENERAL: well appearing female in NAD  SKIN: skin color, texture, turgor are normal, no rashes or significant lesions EYES: conjunctiva are pink and non-injected, sclera clear NECK: supple, non-tender LUNGS: clear to auscultation and percussion with  normal breathing effort HEART: regular rate & rhythm and no murmurs. No lower extremity edema.  Musculoskeletal: no cyanosis of digits and no clubbing  PSYCH: alert & oriented x 3, fluent speech NEURO: no focal motor/sensory deficits  LABORATORY DATA:  I have reviewed the data as listed    Latest Ref Rng & Units 12/06/2024   12:53 PM 11/22/2024    8:25 AM 09/28/2024    5:20 AM  CBC  WBC 4.0 - 10.5 K/uL 4.3  4.5  6.2   Hemoglobin 12.0 - 15.0 g/dL 89.7  89.2  87.8   Hematocrit 36.0 - 46.0 % 30.9  33.4  35.6   Platelets 150 - 400 K/uL 189  202  202        Latest Ref Rng & Units 12/06/2024   12:53 PM 09/28/2024    5:20 AM 09/06/2024   10:26 AM  CMP  Glucose 70 - 99 mg/dL 865  93  82   BUN 6 - 20 mg/dL <5  <5  6   Creatinine 0.44 - 1.00 mg/dL 9.44  9.34  9.46   Sodium 135 - 145 mmol/L 135  134  135   Potassium 3.5 - 5.1 mmol/L 3.9  3.3  4.0   Chloride 98 - 111 mmol/L 103  101  103   CO2 22 - 32 mmol/L 23  20  25    Calcium 8.9 - 10.3 mg/dL 9.3  89.9  89.9   Total Protein 6.5 - 8.1 g/dL 6.6  7.0  7.5   Total Bilirubin 0.0 - 1.2 mg/dL 0.3  0.3  0.4   Alkaline Phos 38 - 126 U/L 66  40  34   AST 15 - 41 U/L 25  21  19    ALT 0 - 44 U/L 18  17  19       RADIOGRAPHIC STUDIES: I have personally reviewed the radiological images as listed and agreed with the findings in the report. US  MFM OB FOLLOW UP Result Date: 12/06/2024 ----------------------------------------------------------------------  OBSTETRICS REPORT                        (Signed Final 12/06/2024 05:23 pm) ---------------------------------------------------------------------- Patient Info  ID #:       [id number]                          D.O.B.:  May 28, 1996 (28 yrs)(F)  Name:       Barbara Rice                Visit Date: 12/06/2024 02:43 pm ---------------------------------------------------------------------- Performed By  Attending:        Fredia Fresh MD        Ref. Address:     [address]                                                             [address]  [place], [place]                                                             [address]  Performed By:     Emelia Coombs BS,       Location[facility], [facility]                                [facility]  Referred By:      [facility] ---------------------------------------------------------------------- Orders  #  Description                           Code        Ordered By  1  US  MFM OB FOLLOW UP                   23183.98    BABARA KEYS ----------------------------------------------------------------------  #  Order #                     Accession #                Episode #  1  [id number]                   [id number]                 [id number] ---------------------------------------------------------------------- Indications  Uterine fibroids                               O34.10  Medical complication of pregnancy (seizure     O26.90  like activity last in May)- Negative EEG and  MRI  Encounter for other antenatal screening        Z36.2  follow-up  [redacted] weeks gestation of pregnancy                Z3A.30  Cervical shortening complicating pregnancy     O26.879  Previous cervical surgery (LEEP) 2023          O34.40  LOW risk NIPS / Negative Horizon /  Negative AFP  ---------------------------------------------------------------------- Fetal Evaluation  Num Of Fetuses:         1  Fetal Heart Rate(bpm):  139  Cardiac Activity:  Observed  Presentation:           Cephalic  Placenta:               Posterior  P. Cord Insertion:      Previously seen  Amniotic Fluid  AFI FV:      Within normal limits  AFI Sum(cm)     %Tile       Largest Pocket(cm)  13.86           45          4.17  RUQ(cm)       RLQ(cm)       LUQ(cm)        LLQ(cm)  2.77          3.16          3.77           4.17 ---------------------------------------------------------------------- Biometry  BPD:      81.7  mm     G. Age:  32w 6d         94  %    CI:        75.97   %    70 - 86                                                          FL/HC:      19.3   %    19.3 - 21.3  HC:      297.1  mm     G. Age:  32w 6d         78  %    HC/AC:      1.08        0.96 - 1.17  AC:      274.6  mm     G. Age:  31w 4d         74  %    FL/BPD:     70.3   %    71 - 87  FL:       57.4  mm     G. Age:  30w 1d         23  %    FL/AC:      20.9   %    20 - 24  LV:        3.8  mm  Est. FW:    1743  gm    3 lb 13 oz      64  % ---------------------------------------------------------------------- OB History  Gravidity:    1         Term:   0        Prem:   0        SAB:   0  TOP:          0       Ectopic:  0        Living: 0 ---------------------------------------------------------------------- Gestational Age  LMP:           29w 5d        Date:  05/12/24                  EDD:   02/16/25  U/S Today:     31w 6d  EDD:   02/01/25  Best:          30w 4d     Det. By:  Early Ultrasound         EDD:   02/10/25                                      (07/16/24) ---------------------------------------------------------------------- Anatomy  Cranium:               Appears normal         Aortic Arch:            Previously seen  Cavum:                 Appears normal         Ductal Arch:            Previously seen   Ventricles:            Appears normal         Diaphragm:              Appears normal  Choroid Plexus:        Previously seen        Stomach:                Appears normal, left                                                                        sided  Cerebellum:            Previously seen        Abdomen:                Previously seen  Posterior Fossa:       Previously seen        Abdominal Wall:         Previously seen  Face:                  Orbits and profile     Cord Vessels:           Previously seen                         previously seen  Lips:                  Previously seen        Kidneys:                Appear normal  Thoracic:              Appears normal         Bladder:                Appears normal  Heart:                 Appears normal         Spine:                  Previously seen                         (  4CH, axis, and                         situs)  RVOT:                  Previously seen        Upper Extremities:      Previously seen  LVOT:                  Previously seen        Lower Extremities:      Previously seen  Other:  Fetal anatomic survey complete on prior scans with exception of the          fetal neck. ---------------------------------------------------------------------- Cervix Uterus Adnexa  Cervix  Not visualized (advanced GA >24wks)  Uterus  Multiple fibroids noted, see table below.  Right Ovary  Within normal limits.  Left Ovary  Within normal limits.  Cul De Sac  No free fluid seen.  Adnexa  No abnormality visualized ---------------------------------------------------------------------- Myomas  Site                     L(cm)      W(cm)      D(cm)       Location  Anterior Mid             2.32       1.43       2.31        Intramural  Anterior Right LUS       1.81       1.11       1.66        Intramural  Posterior Fundus         3.35       2.68       4.81        Intramural ----------------------------------------------------------------------  Blood Flow                  RI        PI       Comments ---------------------------------------------------------------------- Impression  Patient is here for fetal growth assessment.  She had cervical  insufficiency and takes vaginal progesterone  treatment.  Past  surgical history significant for LEEP.  She does not have gestational diabetes.  Blood pressure  today at our office is 140/70 mmHg.  Ultrasound  Normal fetal growth and amniotic fluid.  Cephalic  presentation.  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  I reassured the patient on the findings.  She does not have  hypertension or diabetes no further follow-up is  recommended.  I encouraged her to continue vaginal  progesterone  until [redacted] weeks gestation.  Regardless of the  progesterone  treatment, the risk of preterm delivery is slightly  higher with cervical insufficiency detected at midtrimester  ultrasound. ---------------------------------------------------------------------- Recommendations  Follow-up scans as clinically indicated. ----------------------------------------------------------------------                 Fredia Fresh, MD Electronically Signed Final Report   12/06/2024 05:23 pm ----------------------------------------------------------------------    ASSESSMENT & PLAN Barbara Rice is a 28 y.o. female returns for a follow up for evaluation of leukopenia/neutropenia and iron  deficiency anemia.    # Leukopenia/Neutropenia  --Workup from 09/01/2023 ruled out nutritional deficiencies, hepatitis B/C, HIV and inflammatory process.  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  --Labs from 12/06/2024 show WBC 4.3, ANC 3.1.  --No  indication for bone marrow biopsy at this time  # Iron  Deficiency Anemia in Pregnancy-[redacted] weeks pregnant -- Findings are consistent with iron  deficiency anemia secondary to patient's menorrhagia --Encouraged her to follow-up with OB/GYN  --Continue ferrous sulfate  325 mg  daily with a source of vitamin C every other day as tolerated.  If constipation worsens recommended continuation. --Labs from 12/06/2024 anemia with Hgb 10.2, MCV 89.8. Iron  panel shows ferritin 12, iron  59, TIBC 531, saturation 11%.  --Recommend IV feraheme  x 2 doses.  #Right inguinal lymphadenopathy: --Underwent FNA of right inguinal lymph node on 02/20/2022. Pathology was negative for malignant cells.   Follow up: --RTC approximately March 2026 after delivery with repeat labs.   No orders of the defined types were placed in this encounter.   All questions were answered. The patient knows to call the clinic with any problems, questions or concerns.  I have spent a total of 30 minutes minutes of face-to-face and non-face-to-face time, preparing to see the patient, performing a medically appropriate examination, counseling and educating the patient, ordering tests, documenting clinical information in the electronic health record, and care coordination.   Johnston Police PA-C Dept of Hematology and Oncology [facility] Phone: [phone]

## 2024-12-13 ENCOUNTER — Telehealth (HOSPITAL_COMMUNITY): Payer: Self-pay | Admitting: Pharmacy Technician

## 2024-12-13 ENCOUNTER — Ambulatory Visit: Admitting: Obstetrics

## 2024-12-13 ENCOUNTER — Other Ambulatory Visit (HOSPITAL_COMMUNITY): Payer: Self-pay | Admitting: Physician Assistant

## 2024-12-13 ENCOUNTER — Inpatient Hospital Stay: Admitting: Physician Assistant

## 2024-12-13 ENCOUNTER — Encounter: Payer: Self-pay | Admitting: Obstetrics

## 2024-12-13 VITALS — BP 128/80 | HR 101 | Wt 191.9 lb

## 2024-12-13 VITALS — BP 131/83 | HR 100 | Temp 97.7°F | Resp 16 | Ht 66.0 in | Wt 191.0 lb

## 2024-12-13 DIAGNOSIS — O0992 Supervision of high risk pregnancy, unspecified, second trimester: Secondary | ICD-10-CM

## 2024-12-13 DIAGNOSIS — D509 Iron deficiency anemia, unspecified: Secondary | ICD-10-CM

## 2024-12-13 DIAGNOSIS — O26872 Cervical shortening, second trimester: Secondary | ICD-10-CM | POA: Diagnosis not present

## 2024-12-13 DIAGNOSIS — D259 Leiomyoma of uterus, unspecified: Secondary | ICD-10-CM | POA: Diagnosis not present

## 2024-12-13 DIAGNOSIS — R569 Unspecified convulsions: Secondary | ICD-10-CM

## 2024-12-13 DIAGNOSIS — Z3A31 31 weeks gestation of pregnancy: Secondary | ICD-10-CM | POA: Diagnosis not present

## 2024-12-13 DIAGNOSIS — O3410 Maternal care for benign tumor of corpus uteri, unspecified trimester: Secondary | ICD-10-CM | POA: Diagnosis not present

## 2024-12-13 DIAGNOSIS — O99013 Anemia complicating pregnancy, third trimester: Secondary | ICD-10-CM | POA: Diagnosis not present

## 2024-12-13 DIAGNOSIS — D508 Other iron deficiency anemias: Secondary | ICD-10-CM

## 2024-12-13 DIAGNOSIS — Z9889 Other specified postprocedural states: Secondary | ICD-10-CM

## 2024-12-13 NOTE — Progress Notes (Signed)
 Pt reports swelling in feet and body soreness.

## 2024-12-13 NOTE — Telephone Encounter (Signed)
 Auth Submission: NO AUTH NEEDED Site of care: [facility] Payer: [employer] Medication & CPT/J Code(s) submitted: Feraheme  (ferumoxytol ) [id number] Diagnosis Code: D50.9 Route of submission (phone, fax, portal):  Phone # Fax # Auth type: Buy/Bill HB Units/visits requested: 510mg  x 2 doses Reference number:  Approval from: 12/13/2024 to 03/13/25    Dagoberto Armour, CPhT [facility] Phone: [phone] 12/13/2024

## 2024-12-13 NOTE — Progress Notes (Signed)
 Subjective:  Barbara Rice is a 28 y.o. G1P0000 at [redacted]w[redacted]d being seen today for ongoing prenatal care.  She is currently monitored for the following issues for this high-risk pregnancy and has Inguinal lymphadenopathy; History of seizure-like activity (HCC); Normocytic anemia; H/O LEEP; Iron  deficiency anemia; Supervision of high risk pregnancy, antepartum, second trimester; Uterine fibroid in pregnancy; and Short cervix during pregnancy in second trimester on their problem list.  Patient reports no complaints.  Contractions: Irritability. Vag. Bleeding: None.  Movement: Present. Denies leaking of fluid.   The following portions of the patient's history were reviewed and updated as appropriate: allergies, current medications, past family history, past medical history, past social history, past surgical history and problem list. Problem list updated.  Objective:   Vitals:   12/13/24 0959  BP: 128/80  Pulse: (!) 101  Weight: 191 lb 14.4 oz (87 kg)    Fetal Status: Fetal Heart Rate (bpm): 136   Movement: Present     General:  Alert, oriented and cooperative. Patient is in no acute distress.  Skin: Skin is warm and dry. No rash noted.   Cardiovascular: Normal heart rate noted  Respiratory: Normal respiratory effort, no problems with respiration noted  Abdomen: Soft, gravid, appropriate for gestational age. Pain/Pressure: Present     Pelvic:  Cervical exam deferred        Extremities: Normal range of motion.  Edema: Trace  Mental Status: Normal mood and affect. Normal behavior. Normal judgment and thought content.   Urinalysis:      Assessment and Plan:  Pregnancy: G1P0000 at [redacted]w[redacted]d  1. Supervision of high risk pregnancy, antepartum, second trimester (Primary)  2. Short cervix during pregnancy in second trimester - clinically stable   3. History of seizure-like activity (HCC) Clinically stable  4. H/O LEEP  5. Iron  deficiency anemia secondary to inadequate dietary iron  intake -  taking FeSO4  6. Uterine fibroid in pregnancy - clinically stable   Preterm labor symptoms and general obstetric precautions including but not limited to vaginal bleeding, contractions, leaking of fluid and fetal movement were reviewed in detail with the patient. Please refer to After Visit Summary for other counseling recommendations.   Return in about 2 weeks (around 12/27/2024) for ROB.   Rudy Carlin LABOR, MD 12/13/2024

## 2024-12-15 ENCOUNTER — Encounter (HOSPITAL_COMMUNITY): Admission: RE | Admit: 2024-12-15 | Source: Ambulatory Visit

## 2024-12-15 VITALS — BP 122/79 | HR 100 | Temp 98.5°F | Resp 18

## 2024-12-15 DIAGNOSIS — D509 Iron deficiency anemia, unspecified: Secondary | ICD-10-CM | POA: Insufficient documentation

## 2024-12-15 MED ORDER — SODIUM CHLORIDE 0.9 % IV SOLN
510.0000 mg | Freq: Once | INTRAVENOUS | Status: AC
  Administered 2024-12-15: 11:00:00 510 mg via INTRAVENOUS
  Filled 2024-12-15: qty 510

## 2024-12-23 ENCOUNTER — Encounter (HOSPITAL_COMMUNITY)
Admission: RE | Admit: 2024-12-23 | Discharge: 2024-12-23 | Disposition: A | Discharge status: Home / Self Care | Source: Ambulatory Visit | Attending: Physician Assistant | Admitting: Physician Assistant

## 2024-12-23 VITALS — BP 131/84 | HR 112 | Temp 98.8°F | Resp 16

## 2024-12-23 DIAGNOSIS — D509 Iron deficiency anemia, unspecified: Secondary | ICD-10-CM | POA: Diagnosis not present

## 2024-12-23 MED ORDER — SODIUM CHLORIDE 0.9 % IV SOLN
510.0000 mg | Freq: Once | INTRAVENOUS | Status: AC
  Administered 2024-12-23: 510 mg via INTRAVENOUS
  Filled 2024-12-23: qty 510

## 2024-12-27 ENCOUNTER — Ambulatory Visit (INDEPENDENT_AMBULATORY_CARE_PROVIDER_SITE_OTHER): Payer: Self-pay | Admitting: Obstetrics

## 2024-12-27 ENCOUNTER — Encounter: Payer: Self-pay | Admitting: Obstetrics

## 2024-12-27 VITALS — BP 125/76 | HR 86 | Wt 192.8 lb

## 2024-12-27 DIAGNOSIS — Z3A33 33 weeks gestation of pregnancy: Secondary | ICD-10-CM | POA: Diagnosis not present

## 2024-12-27 DIAGNOSIS — Z9889 Other specified postprocedural states: Secondary | ICD-10-CM

## 2024-12-27 DIAGNOSIS — R569 Unspecified convulsions: Secondary | ICD-10-CM

## 2024-12-27 DIAGNOSIS — D508 Other iron deficiency anemias: Secondary | ICD-10-CM

## 2024-12-27 DIAGNOSIS — O0993 Supervision of high risk pregnancy, unspecified, third trimester: Secondary | ICD-10-CM

## 2024-12-27 DIAGNOSIS — O0992 Supervision of high risk pregnancy, unspecified, second trimester: Secondary | ICD-10-CM

## 2024-12-27 DIAGNOSIS — O26873 Cervical shortening, third trimester: Secondary | ICD-10-CM | POA: Diagnosis not present

## 2024-12-27 DIAGNOSIS — O26872 Cervical shortening, second trimester: Secondary | ICD-10-CM

## 2024-12-27 NOTE — Progress Notes (Signed)
 Subjective:  Barbara Rice is a 28 y.o. G1P0000 at [redacted]w[redacted]d being seen today for ongoing prenatal care.  She is currently monitored for the following issues for this high-risk pregnancy and has Inguinal lymphadenopathy; History of seizure-like activity (HCC); Normocytic anemia; H/O LEEP; Iron  deficiency anemia; Supervision of high risk pregnancy, antepartum, second trimester; Uterine fibroid in pregnancy; and Short cervix during pregnancy in second trimester on their problem list.  Patient reports backache.  Contractions: Irritability. Vag. Bleeding: None.  Movement: Present. Denies leaking of fluid.   The following portions of the patient's history were reviewed and updated as appropriate: allergies, current medications, past family history, past medical history, past social history, past surgical history and problem list. Problem list updated.  Objective:   Vitals:   12/27/24 1057  BP: 125/76  Pulse: 86  Weight: 192 lb 12.8 oz (87.5 kg)    Fetal Status: Fetal Heart Rate (bpm): 140   Movement: Present     General:  Alert, oriented and cooperative. Patient is in no acute distress.  Skin: Skin is warm and dry. No rash noted.   Cardiovascular: Normal heart rate noted  Respiratory: Normal respiratory effort, no problems with respiration noted  Abdomen: Soft, gravid, appropriate for gestational age. Pain/Pressure: Present     Pelvic:  Cervical exam deferred        Extremities: Normal range of motion.  Edema: None  Mental Status: Normal mood and affect. Normal behavior. Normal judgment and thought content.   Urinalysis:      Assessment and Plan:  Pregnancy: G1P0000 at [redacted]w[redacted]d  1. Supervision of high risk pregnancy, antepartum, second trimester (Primary)  2. Short cervix during pregnancy in second trimester  3. H/O LEEP  4. Iron  deficiency anemia secondary to inadequate dietary iron  intake - taking iron   5. History of seizure-like activity (HCC) = clinically stable  Preterm labor  symptoms and general obstetric precautions including but not limited to vaginal bleeding, contractions, leaking of fluid and fetal movement were reviewed in detail with the patient. Please refer to After Visit Summary for other counseling recommendations.   Return in about 2 weeks (around 01/10/2025) for ROB.   Rudy Carlin LABOR, MD 12/27/2024

## 2024-12-27 NOTE — Progress Notes (Signed)
 Pt presents for ROB visit. C/o pelvic and back pain.

## 2025-01-10 ENCOUNTER — Ambulatory Visit: Payer: Self-pay | Admitting: Obstetrics & Gynecology

## 2025-01-10 VITALS — BP 129/85 | HR 97 | Wt 193.0 lb

## 2025-01-10 DIAGNOSIS — D509 Iron deficiency anemia, unspecified: Secondary | ICD-10-CM

## 2025-01-10 DIAGNOSIS — D259 Leiomyoma of uterus, unspecified: Secondary | ICD-10-CM

## 2025-01-10 DIAGNOSIS — Z3A35 35 weeks gestation of pregnancy: Secondary | ICD-10-CM

## 2025-01-10 DIAGNOSIS — O0992 Supervision of high risk pregnancy, unspecified, second trimester: Secondary | ICD-10-CM

## 2025-01-10 DIAGNOSIS — O0993 Supervision of high risk pregnancy, unspecified, third trimester: Secondary | ICD-10-CM

## 2025-01-10 DIAGNOSIS — O3413 Maternal care for benign tumor of corpus uteri, third trimester: Secondary | ICD-10-CM

## 2025-01-10 NOTE — Progress Notes (Signed)
 "  PRENATAL VISIT NOTE  Subjective:  Barbara Rice is a 29 y.o. G1P0000 at [redacted]w[redacted]d being seen today for ongoing prenatal care.  She is currently monitored for the following issues for this low-risk pregnancy and has Inguinal lymphadenopathy; History of seizure-like activity (HCC); Normocytic anemia; H/O LEEP; Iron  deficiency anemia; Supervision of high risk pregnancy, antepartum, second trimester; Uterine fibroid in pregnancy; and Short cervix during pregnancy in second trimester on their problem list.  Patient reports no complaints.  Contractions: Irregular.  .  Movement: Present. Denies leaking of fluid.   The following portions of the patient's history were reviewed and updated as appropriate: allergies, current medications, past family history, past medical history, past social history, past surgical history and problem list.   Objective:   Vitals:   01/10/25 1131  BP: 129/85  Pulse: 97  Weight: 193 lb (87.5 kg)    Fetal Status:  Fetal Heart Rate (bpm): 132   Movement: Present    General: Alert, oriented and cooperative. Patient is in no acute distress.  Skin: Skin is warm and dry. No rash noted.   Cardiovascular: Normal heart rate noted  Respiratory: Normal respiratory effort, no problems with respiration noted  Abdomen: Soft, gravid, appropriate for gestational age.  Pain/Pressure: Present     Pelvic: Cervical exam deferred        Extremities: Normal range of motion.  Edema: Mild pitting, slight indentation  Mental Status: Normal mood and affect. Normal behavior. Normal judgment and thought content.      11/22/2024    9:14 AM 07/19/2024   11:13 AM 03/30/2024    2:45 PM  Depression screen PHQ 2/9  Decreased Interest 0 0 0  Down, Depressed, Hopeless 0 0 0  PHQ - 2 Score 0 0 0  Altered sleeping 0 0   Tired, decreased energy 0 0   Change in appetite 0 0   Feeling bad or failure about yourself  0 0   Trouble concentrating 0 0   Moving slowly or fidgety/restless 0 0    Suicidal thoughts 0 0   PHQ-9 Score 0 0    Difficult doing work/chores Not difficult at all       Data saved with a previous flowsheet row definition        11/22/2024    9:15 AM 07/19/2024   11:13 AM 09/01/2023   10:37 AM 08/15/2022   11:23 AM  GAD 7 : Generalized Anxiety Score  Nervous, Anxious, on Edge 0 0 1 1  Control/stop worrying 0 0 1 0  Worry too much - different things 0 0 1 1  Trouble relaxing 0 0 0 0  Restless 0 0 0 1  Easily annoyed or irritable 0 0 0 0  Afraid - awful might happen 0 0 0 0  Total GAD 7 Score 0 0 3 3  Anxiety Difficulty Not difficult at all  Not difficult at all Somewhat difficult    Assessment and Plan:  Pregnancy: G1P0000 at [redacted]w[redacted]d 1. [redacted] weeks gestation of pregnancy (Primary) GBS next week  2. Supervision of high risk pregnancy, antepartum, second trimester   3. Iron  deficiency anemia, unspecified iron  deficiency anemia type   4. Uterine fibroid in pregnancy  Preterm labor symptoms and general obstetric precautions including but not limited to vaginal bleeding, contractions, leaking of fluid and fetal movement were reviewed in detail with the patient. Please refer to After Visit Summary for other counseling recommendations.   Return in about 1 week (around 01/17/2025).  Future Appointments  Date Time Provider Department Center  01/20/2025 10:55 AM Nicholaus Jorene BRAVO, PA-C [facility] None  03/24/2025 12:30 PM [facility] None  03/24/2025  1:00 PM Neomi Johnston DASEN, PA-C [facility] None    Lynwood Solomons, MD "

## 2025-01-20 ENCOUNTER — Encounter: Payer: Self-pay | Admitting: Physician Assistant

## 2025-01-20 ENCOUNTER — Ambulatory Visit: Payer: Self-pay | Admitting: Physician Assistant

## 2025-01-20 ENCOUNTER — Other Ambulatory Visit: Payer: Self-pay | Admitting: Physician Assistant

## 2025-01-20 ENCOUNTER — Other Ambulatory Visit (HOSPITAL_COMMUNITY)
Admission: RE | Admit: 2025-01-20 | Discharge: 2025-01-20 | Disposition: A | Source: Ambulatory Visit | Attending: Physician Assistant | Admitting: Physician Assistant

## 2025-01-20 VITALS — BP 135/80 | HR 102 | Wt 199.4 lb

## 2025-01-20 DIAGNOSIS — O26873 Cervical shortening, third trimester: Secondary | ICD-10-CM

## 2025-01-20 DIAGNOSIS — D259 Leiomyoma of uterus, unspecified: Secondary | ICD-10-CM

## 2025-01-20 DIAGNOSIS — O26872 Cervical shortening, second trimester: Secondary | ICD-10-CM

## 2025-01-20 DIAGNOSIS — Z3A37 37 weeks gestation of pregnancy: Secondary | ICD-10-CM | POA: Diagnosis not present

## 2025-01-20 DIAGNOSIS — O0992 Supervision of high risk pregnancy, unspecified, second trimester: Secondary | ICD-10-CM

## 2025-01-20 DIAGNOSIS — O3413 Maternal care for benign tumor of corpus uteri, third trimester: Secondary | ICD-10-CM | POA: Diagnosis not present

## 2025-01-20 DIAGNOSIS — D649 Anemia, unspecified: Secondary | ICD-10-CM

## 2025-01-20 DIAGNOSIS — O0993 Supervision of high risk pregnancy, unspecified, third trimester: Secondary | ICD-10-CM | POA: Diagnosis not present

## 2025-01-20 NOTE — Progress Notes (Signed)
 "  PRENATAL VISIT NOTE  Subjective:  Barbara Rice is a 29 y.o. G1P0000 at [redacted]w[redacted]d being seen today for ongoing prenatal care.  She is currently monitored for the following issues for this high-risk pregnancy and has Inguinal lymphadenopathy; History of seizure-like activity (HCC); Normocytic anemia; H/O LEEP; Iron  deficiency anemia; Supervision of high risk pregnancy, antepartum, second trimester; Uterine fibroid in pregnancy; Short cervix during pregnancy in second trimester; and Anemia on their problem list.  Patient reports no complaints.  Contractions: Irritability. Vag. Bleeding: None.  Movement: Present. Denies leaking of fluid.   The following portions of the patient's history were reviewed and updated as appropriate: allergies, current medications, past family history, past medical history, past social history, past surgical history and problem list.   Objective:   Vitals:   01/20/25 1111  BP: 135/80  Pulse: (!) 102  Weight: 199 lb 6.4 oz (90.4 kg)    Fetal Status:  Fetal Heart Rate (bpm): 146 Fundal Height: 37 cm Movement: Present    General: Alert, oriented and cooperative. Patient is in no acute distress.  Skin: Skin is warm and dry. No rash noted.   Cardiovascular: Normal heart rate noted  Respiratory: Normal respiratory effort, no problems with respiration noted  Abdomen: Soft, gravid, appropriate for gestational age.  Pain/Pressure: Absent     Pelvic: Cervical exam performed in the presence of a chaperone: 20/closed        Extremities: Normal range of motion.  Edema: Mild pitting, slight indentation  Mental Status: Normal mood and affect. Normal behavior. Normal judgment and thought content.      11/22/2024    9:14 AM 07/19/2024   11:13 AM 03/30/2024    2:45 PM  Depression screen PHQ 2/9  Decreased Interest 0 0 0  Down, Depressed, Hopeless 0 0 0  PHQ - 2 Score 0 0 0  Altered sleeping 0 0   Tired, decreased energy 0 0   Change in appetite 0 0   Feeling bad or  failure about yourself  0 0   Trouble concentrating 0 0   Moving slowly or fidgety/restless 0 0   Suicidal thoughts 0 0   PHQ-9 Score 0 0    Difficult doing work/chores Not difficult at all       Data saved with a previous flowsheet row definition        11/22/2024    9:15 AM 07/19/2024   11:13 AM 09/01/2023   10:37 AM 08/15/2022   11:23 AM  GAD 7 : Generalized Anxiety Score  Nervous, Anxious, on Edge 0  0  1  1   Control/stop worrying 0  0  1  0   Worry too much - different things 0  0  1  1   Trouble relaxing 0  0  0  0   Restless 0  0  0  1   Easily annoyed or irritable 0  0  0  0   Afraid - awful might happen 0  0  0  0   Total GAD 7 Score 0 0 3 3  Anxiety Difficulty Not difficult at all  Not difficult at all Somewhat difficult     Data saved with a previous flowsheet row definition    Assessment and Plan:  Pregnancy: G1P0000 at [redacted]w[redacted]d  1. Supervision of high risk pregnancy, antepartum, second trimester (Primary) Patient doing well, feeling regular fetal movement BP, FHR, FH appropriate   2. [redacted] weeks gestation of pregnancy Anticipatory guidance about  next visits/weeks of pregnancy given.   3. Short cervix during pregnancy in second trimester Patient advised may discontinue vaginal Prometrium   Cervical os closed  4. Uterine fibroid in pregnancy   5. Anemia, unspecified type S/p IV iron  infusion x2  Term labor symptoms and general obstetric precautions including but not limited to vaginal bleeding, contractions, leaking of fluid and fetal movement were reviewed in detail with the patient.  Please refer to After Visit Summary for other counseling recommendations.   No follow-ups on file.  Future Appointments  Date Time Provider Department Center  01/26/2025 10:55 AM Rudy Carlin LABOR, MD [facility] None  03/24/2025 12:30 PM [facility] None  03/24/2025  1:00 PM Neomi Johnston DASEN, PA-C [facility] None    Jorene FORBES Moats, PA-C  "

## 2025-01-20 NOTE — Progress Notes (Unsigned)
 Pt presents for ROB visit. No concerns

## 2025-01-21 LAB — CERVICOVAGINAL ANCILLARY ONLY
Chlamydia: NEGATIVE
Comment: NEGATIVE
Comment: NEGATIVE
Comment: NORMAL
Neisseria Gonorrhea: NEGATIVE
Trichomonas: NEGATIVE

## 2025-01-23 ENCOUNTER — Ambulatory Visit: Payer: Self-pay | Admitting: Physician Assistant

## 2025-01-24 ENCOUNTER — Ambulatory Visit (HOSPITAL_COMMUNITY): Payer: Self-pay | Admitting: Physician Assistant

## 2025-01-24 LAB — CULTURE, BETA STREP (GROUP B ONLY): Strep Gp B Culture: NEGATIVE

## 2025-01-25 ENCOUNTER — Encounter: Payer: Self-pay | Admitting: Obstetrics & Gynecology

## 2025-01-26 ENCOUNTER — Ambulatory Visit (INDEPENDENT_AMBULATORY_CARE_PROVIDER_SITE_OTHER): Admitting: Obstetrics

## 2025-01-26 ENCOUNTER — Encounter: Payer: Self-pay | Admitting: Obstetrics

## 2025-01-26 VITALS — BP 129/79 | HR 86 | Wt 199.7 lb

## 2025-01-26 DIAGNOSIS — D508 Other iron deficiency anemias: Secondary | ICD-10-CM

## 2025-01-26 DIAGNOSIS — O26879 Cervical shortening, unspecified trimester: Secondary | ICD-10-CM

## 2025-01-26 DIAGNOSIS — Z3A37 37 weeks gestation of pregnancy: Secondary | ICD-10-CM | POA: Diagnosis not present

## 2025-01-26 DIAGNOSIS — O3413 Maternal care for benign tumor of corpus uteri, third trimester: Secondary | ICD-10-CM | POA: Diagnosis not present

## 2025-01-26 DIAGNOSIS — D259 Leiomyoma of uterus, unspecified: Secondary | ICD-10-CM | POA: Diagnosis not present

## 2025-01-26 DIAGNOSIS — Z9889 Other specified postprocedural states: Secondary | ICD-10-CM | POA: Diagnosis not present

## 2025-01-26 DIAGNOSIS — O26873 Cervical shortening, third trimester: Secondary | ICD-10-CM

## 2025-01-26 DIAGNOSIS — R569 Unspecified convulsions: Secondary | ICD-10-CM

## 2025-01-26 DIAGNOSIS — O0993 Supervision of high risk pregnancy, unspecified, third trimester: Secondary | ICD-10-CM | POA: Diagnosis not present

## 2025-01-26 MED ORDER — ACCRUFER 30 MG PO CAPS: 1.0000 | ORAL_CAPSULE | Freq: Two times a day (BID) | ORAL | 3 refills | Status: AC

## 2025-01-26 MED ORDER — VITAFOL ULTRA 29-0.6-0.4-200 MG PO CAPS: 1.0000 | ORAL_CAPSULE | Freq: Every day | ORAL | 4 refills | Status: AC

## 2025-01-26 NOTE — Progress Notes (Signed)
 Pt presents for rob. Pt has no questions or concerns at this time.

## 2025-01-26 NOTE — Progress Notes (Signed)
 Subjective:  Barbara Rice is a 29 y.o. G1P0000 at [redacted]w[redacted]d being seen today for ongoing prenatal care.  She is currently monitored for the following issues for this high-risk pregnancy and has Inguinal lymphadenopathy; History of seizure-like activity (HCC); Normocytic anemia; H/O LEEP; Iron  deficiency anemia; Supervision of high risk pregnancy, antepartum, second trimester; Uterine fibroid in pregnancy; Short cervix during pregnancy in second trimester; and Anemia on their problem list.  Patient reports no complaints.  Contractions: Irritability. Vag. Bleeding: None.  Movement: Present. Denies leaking of fluid.   The following portions of the patient's history were reviewed and updated as appropriate: allergies, current medications, past family history, past medical history, past social history, past surgical history and problem list. Problem list updated.  Objective:   Vitals:   01/26/25 1120  BP: 129/79  Pulse: 86  Weight: 199 lb 11.2 oz (90.6 kg)    Fetal Status: Fetal Heart Rate (bpm): 136   Movement: Present     General:  Alert, oriented and cooperative. Patient is in no acute distress.  Skin: Skin is warm and dry. No rash noted.   Cardiovascular: Normal heart rate noted  Respiratory: Normal respiratory effort, no problems with respiration noted  Abdomen: Soft, gravid, appropriate for gestational age. Pain/Pressure: Present     Pelvic:  Cervical exam deferred        Extremities: Normal range of motion.  Edema: None  Mental Status: Normal mood and affect. Normal behavior. Normal judgment and thought content.   Urinalysis:      Assessment and Plan:  Pregnancy: G1P0000 at [redacted]w[redacted]d  1. Supervision of high risk pregnancy in third trimester (Primary) Rx: - Prenat-Fe Poly-Methfol-FA-DHA (VITAFOL  ULTRA) 29-0.6-0.4-200 MG CAPS; Take 1 capsule by mouth daily before breakfast.  Dispense: 34 each; Refill: 4  2. History of seizure-like activity (HCC) = clinically stable  3. Iron   deficiency anemia secondary to inadequate dietary iron  intake Rx: - Ferric Maltol  (ACCRUFER ) 30 MG CAPS; Take 1 capsule (30 mg total) by mouth 2 (two) times daily before a meal. Take 2 hrs before, or 2 hrs after a meal.  Dispense: 60 capsule; Refill: 3  4. H/O LEEP  5. Short cervix affecting pregnancy = clinically stable  6. Uterine fibroid in pregnancy - clinically stable    Term labor symptoms and general obstetric precautions including but not limited to vaginal bleeding, contractions, leaking of fluid and fetal movement were reviewed in detail with the patient. Please refer to After Visit Summary for other counseling recommendations.   Return in about 1 week (around 02/02/2025) for [facility].   Rudy Carlin LABOR, MD 01/26/2025

## 2025-02-02 ENCOUNTER — Ambulatory Visit: Payer: Self-pay | Admitting: Physician Assistant

## 2025-02-02 VITALS — BP 134/78 | HR 95 | Wt 202.0 lb

## 2025-02-02 DIAGNOSIS — O0992 Supervision of high risk pregnancy, unspecified, second trimester: Secondary | ICD-10-CM

## 2025-02-02 DIAGNOSIS — O26872 Cervical shortening, second trimester: Secondary | ICD-10-CM

## 2025-02-02 DIAGNOSIS — D259 Leiomyoma of uterus, unspecified: Secondary | ICD-10-CM

## 2025-02-02 DIAGNOSIS — D649 Anemia, unspecified: Secondary | ICD-10-CM

## 2025-02-02 DIAGNOSIS — R569 Unspecified convulsions: Secondary | ICD-10-CM

## 2025-02-02 DIAGNOSIS — Z3A38 38 weeks gestation of pregnancy: Secondary | ICD-10-CM

## 2025-02-02 NOTE — Progress Notes (Unsigned)
 "  PRENATAL VISIT NOTE  Subjective:  Barbara Rice is a 29 y.o. G1P0000 at [redacted]w[redacted]d being seen today for ongoing prenatal care.  She is currently monitored for the following issues for this {Blank single:19197::high-risk,low-risk} pregnancy and has Inguinal lymphadenopathy; History of seizure-like activity (HCC); Normocytic anemia; H/O LEEP; Iron  deficiency anemia; Supervision of high risk pregnancy, antepartum, second trimester; Uterine fibroid in pregnancy; Short cervix during pregnancy in second trimester; and Anemia on their problem list.  Patient reports {sx:[id number]}.  Contractions: Irritability. Vag. Bleeding: None.  Movement: Present. Denies leaking of fluid.   The following portions of the patient's history were reviewed and updated as appropriate: allergies, current medications, past family history, past medical history, past social history, past surgical history and problem list.   Objective:   Vitals:   02/02/25 1123  BP: 134/78  Pulse: 95  Weight: 202 lb (91.6 kg)    Fetal Status:  Fetal Heart Rate (bpm): 138   Movement: Present    General: Alert, oriented and cooperative. Patient is in no acute distress.  Skin: Skin is warm and dry. No rash noted.   Cardiovascular: Normal heart rate noted  Respiratory: Normal respiratory effort, no problems with respiration noted  Abdomen: Soft, gravid, appropriate for gestational age.  Pain/Pressure: Present     Pelvic: {Blank single:19197::Cervical exam performed in the presence of a chaperone,Cervical exam deferred}        Extremities: Normal range of motion.  Edema: Trace (feet)  Mental Status: Normal mood and affect. Normal behavior. Normal judgment and thought content.      11/22/2024    9:14 AM 07/19/2024   11:13 AM 03/30/2024    2:45 PM  Depression screen PHQ 2/9  Decreased Interest 0 0 0  Down, Depressed, Hopeless 0 0 0  PHQ - 2 Score 0 0 0  Altered sleeping 0 0   Tired, decreased energy 0 0   Change in appetite 0 0    Feeling bad or failure about yourself  0 0   Trouble concentrating 0 0   Moving slowly or fidgety/restless 0 0   Suicidal thoughts 0 0   PHQ-9 Score 0 0    Difficult doing work/chores Not difficult at all       Data saved with a previous flowsheet row definition        11/22/2024    9:15 AM 07/19/2024   11:13 AM 09/01/2023   10:37 AM 08/15/2022   11:23 AM  GAD 7 : Generalized Anxiety Score  Nervous, Anxious, on Edge 0  0  1  1   Control/stop worrying 0  0  1  0   Worry too much - different things 0  0  1  1   Trouble relaxing 0  0  0  0   Restless 0  0  0  1   Easily annoyed or irritable 0  0  0  0   Afraid - awful might happen 0  0  0  0   Total GAD 7 Score 0 0 3 3  Anxiety Difficulty Not difficult at all  Not difficult at all Somewhat difficult     Data saved with a previous flowsheet row definition    Assessment and Plan:  Pregnancy: G1P0000 at [redacted]w[redacted]d  1. Supervision of high risk pregnancy, antepartum, second trimester (Primary) Patient doing well, feeling regular fetal movement BP, FHR, FH appropriate   2. [redacted] weeks gestation of pregnancy Anticipatory guidance about next visits/weeks of pregnancy given.  3. Short cervix during pregnancy in second trimester Patient discontinued vaginal progesterone  at 36 weeks  4. History of seizure-like activity (HCC) ***  5. Uterine fibroid in pregnancy ***  6. Anemia, unspecified type ***  {Blank single:19197::Term,Preterm} labor symptoms and general obstetric precautions including but not limited to vaginal bleeding, contractions, leaking of fluid and fetal movement were reviewed in detail with the patient.  Please refer to After Visit Summary for other counseling recommendations.   No follow-ups on file.  Future Appointments  Date Time Provider Department Center  02/10/2025  9:55 AM Zina Jerilynn LABOR, MD [facility] None  03/24/2025 12:30 PM [facility] None  03/24/2025  1:00 PM Neomi Johnston DASEN, PA-C  [facility] None    Jorene FORBES Moats, PA-C  "

## 2025-02-02 NOTE — Progress Notes (Unsigned)
 Pt presents for rob. Pt has no questions or concerns at this time.

## 2025-02-07 ENCOUNTER — Encounter: Admitting: Obstetrics

## 2025-02-10 ENCOUNTER — Encounter: Payer: Self-pay | Admitting: Obstetrics and Gynecology

## 2025-03-24 ENCOUNTER — Inpatient Hospital Stay

## 2025-03-24 ENCOUNTER — Inpatient Hospital Stay: Admitting: Physician Assistant
